# Patient Record
Sex: Male | Born: 1940 | Race: Black or African American | Hispanic: No | Marital: Married | Smoking: Former smoker
Health system: Southern US, Community
[De-identification: ages and names within clinical notes are randomized; demographics above are authoritative.]

## PROBLEM LIST (undated history)

## (undated) DIAGNOSIS — K279 Peptic ulcer, site unspecified, unspecified as acute or chronic, without hemorrhage or perforation: Secondary | ICD-10-CM

## (undated) DIAGNOSIS — I712 Thoracic aortic aneurysm, without rupture, unspecified: Secondary | ICD-10-CM

## (undated) DIAGNOSIS — Z8546 Personal history of malignant neoplasm of prostate: Secondary | ICD-10-CM

## (undated) DIAGNOSIS — R7302 Impaired glucose tolerance (oral): Secondary | ICD-10-CM

## (undated) DIAGNOSIS — F419 Anxiety disorder, unspecified: Secondary | ICD-10-CM

## (undated) DIAGNOSIS — I219 Acute myocardial infarction, unspecified: Secondary | ICD-10-CM

## (undated) DIAGNOSIS — Z9889 Other specified postprocedural states: Secondary | ICD-10-CM

## (undated) DIAGNOSIS — N393 Stress incontinence (female) (male): Secondary | ICD-10-CM

## (undated) DIAGNOSIS — R9389 Abnormal findings on diagnostic imaging of other specified body structures: Secondary | ICD-10-CM

## (undated) DIAGNOSIS — I10 Essential (primary) hypertension: Secondary | ICD-10-CM

## (undated) DIAGNOSIS — K219 Gastro-esophageal reflux disease without esophagitis: Secondary | ICD-10-CM

## (undated) DIAGNOSIS — N529 Male erectile dysfunction, unspecified: Secondary | ICD-10-CM

## (undated) DIAGNOSIS — H269 Unspecified cataract: Secondary | ICD-10-CM

## (undated) DIAGNOSIS — H4010X0 Unspecified open-angle glaucoma, stage unspecified: Secondary | ICD-10-CM

## (undated) DIAGNOSIS — E669 Obesity, unspecified: Secondary | ICD-10-CM

## (undated) DIAGNOSIS — M519 Unspecified thoracic, thoracolumbar and lumbosacral intervertebral disc disorder: Secondary | ICD-10-CM

## (undated) DIAGNOSIS — T7840XA Allergy, unspecified, initial encounter: Secondary | ICD-10-CM

## (undated) DIAGNOSIS — N210 Calculus in bladder: Secondary | ICD-10-CM

## (undated) DIAGNOSIS — I5020 Unspecified systolic (congestive) heart failure: Secondary | ICD-10-CM

## (undated) DIAGNOSIS — C61 Malignant neoplasm of prostate: Secondary | ICD-10-CM

## (undated) DIAGNOSIS — B9681 Helicobacter pylori [H. pylori] as the cause of diseases classified elsewhere: Secondary | ICD-10-CM

## (undated) DIAGNOSIS — E785 Hyperlipidemia, unspecified: Secondary | ICD-10-CM

## (undated) DIAGNOSIS — I77810 Thoracic aortic ectasia: Secondary | ICD-10-CM

## (undated) DIAGNOSIS — M1990 Unspecified osteoarthritis, unspecified site: Secondary | ICD-10-CM

## (undated) DIAGNOSIS — D649 Anemia, unspecified: Secondary | ICD-10-CM

## (undated) DIAGNOSIS — J45909 Unspecified asthma, uncomplicated: Secondary | ICD-10-CM

## (undated) HISTORY — PX: BACK SURGERY: SHX140

## (undated) HISTORY — DX: Helicobacter pylori (H. pylori) as the cause of diseases classified elsewhere: B96.81

## (undated) HISTORY — DX: Stress incontinence (female) (male): N39.3

## (undated) HISTORY — DX: Impaired glucose tolerance (oral): R73.02

## (undated) HISTORY — DX: Anemia, unspecified: D64.9

## (undated) HISTORY — DX: Abnormal findings on diagnostic imaging of other specified body structures: R93.89

## (undated) HISTORY — PX: PROSTATECTOMY: SHX69

## (undated) HISTORY — DX: Obesity, unspecified: E66.9

## (undated) HISTORY — DX: Anxiety disorder, unspecified: F41.9

## (undated) HISTORY — DX: Peptic ulcer, site unspecified, unspecified as acute or chronic, without hemorrhage or perforation: K27.9

## (undated) HISTORY — DX: Other specified postprocedural states: Z98.890

## (undated) HISTORY — DX: Unspecified systolic (congestive) heart failure: I50.20

## (undated) HISTORY — DX: Thoracic aortic ectasia: I77.810

## (undated) HISTORY — DX: Allergy, unspecified, initial encounter: T78.40XA

## (undated) HISTORY — DX: Unspecified open-angle glaucoma, stage unspecified: H40.10X0

## (undated) HISTORY — DX: Gastro-esophageal reflux disease without esophagitis: K21.9

## (undated) HISTORY — DX: Unspecified asthma, uncomplicated: J45.909

## (undated) HISTORY — DX: Helicobacter pylori (H. pylori) as the cause of diseases classified elsewhere: K27.9

## (undated) HISTORY — DX: Thoracic aortic aneurysm, without rupture, unspecified: I71.20

## (undated) HISTORY — DX: Acute myocardial infarction, unspecified: I21.9

## (undated) HISTORY — DX: Malignant neoplasm of prostate: C61

## (undated) HISTORY — DX: Unspecified cataract: H26.9

## (undated) HISTORY — DX: Hyperlipidemia, unspecified: E78.5

## (undated) HISTORY — DX: Male erectile dysfunction, unspecified: N52.9

## (undated) HISTORY — DX: Thoracic aortic aneurysm, without rupture: I71.2

## (undated) HISTORY — PX: OTHER SURGICAL HISTORY: SHX169

## (undated) HISTORY — DX: Personal history of malignant neoplasm of prostate: Z85.46

## (undated) HISTORY — DX: Calculus in bladder: N21.0

## (undated) HISTORY — DX: Unspecified thoracic, thoracolumbar and lumbosacral intervertebral disc disorder: M51.9

## (undated) HISTORY — DX: Unspecified osteoarthritis, unspecified site: M19.90

## (undated) HISTORY — DX: Essential (primary) hypertension: I10

---

## 1998-05-20 ENCOUNTER — Encounter: Admission: RE | Admit: 1998-05-20 | Discharge: 1998-05-20 | Payer: Self-pay | Admitting: Internal Medicine

## 1998-06-05 ENCOUNTER — Encounter: Admission: RE | Admit: 1998-06-05 | Discharge: 1998-06-05 | Payer: Self-pay | Admitting: Hematology and Oncology

## 1998-06-13 ENCOUNTER — Encounter: Admission: RE | Admit: 1998-06-13 | Discharge: 1998-06-13 | Payer: Self-pay | Admitting: Internal Medicine

## 1998-11-13 ENCOUNTER — Encounter: Admission: RE | Admit: 1998-11-13 | Discharge: 1998-11-13 | Payer: Self-pay | Admitting: Internal Medicine

## 1999-03-16 ENCOUNTER — Encounter: Admission: RE | Admit: 1999-03-16 | Discharge: 1999-03-16 | Payer: Self-pay | Admitting: Internal Medicine

## 1999-10-13 ENCOUNTER — Emergency Department (HOSPITAL_COMMUNITY): Admission: EM | Admit: 1999-10-13 | Discharge: 1999-10-13 | Payer: Self-pay | Admitting: *Deleted

## 1999-10-13 ENCOUNTER — Encounter: Payer: Self-pay | Admitting: Emergency Medicine

## 1999-11-30 ENCOUNTER — Other Ambulatory Visit: Admission: RE | Admit: 1999-11-30 | Discharge: 1999-11-30 | Payer: Self-pay | Admitting: Otolaryngology

## 1999-11-30 ENCOUNTER — Encounter (INDEPENDENT_AMBULATORY_CARE_PROVIDER_SITE_OTHER): Payer: Self-pay

## 2000-01-15 ENCOUNTER — Encounter: Admission: RE | Admit: 2000-01-15 | Discharge: 2000-01-15 | Payer: Self-pay | Admitting: Internal Medicine

## 2000-06-29 ENCOUNTER — Encounter: Admission: RE | Admit: 2000-06-29 | Discharge: 2000-06-29 | Payer: Self-pay | Admitting: Internal Medicine

## 2001-01-30 ENCOUNTER — Encounter: Admission: RE | Admit: 2001-01-30 | Discharge: 2001-01-30 | Payer: Self-pay | Admitting: Internal Medicine

## 2001-01-30 ENCOUNTER — Encounter: Payer: Self-pay | Admitting: Internal Medicine

## 2001-01-30 ENCOUNTER — Ambulatory Visit (HOSPITAL_COMMUNITY): Admission: RE | Admit: 2001-01-30 | Discharge: 2001-01-30 | Payer: Self-pay | Admitting: Internal Medicine

## 2001-02-08 ENCOUNTER — Encounter: Admission: RE | Admit: 2001-02-08 | Discharge: 2001-02-08 | Payer: Self-pay | Admitting: Internal Medicine

## 2001-03-07 ENCOUNTER — Encounter: Admission: RE | Admit: 2001-03-07 | Discharge: 2001-03-07 | Payer: Self-pay | Admitting: Internal Medicine

## 2002-04-05 ENCOUNTER — Emergency Department (HOSPITAL_COMMUNITY): Admission: EM | Admit: 2002-04-05 | Discharge: 2002-04-05 | Payer: Self-pay

## 2002-06-13 ENCOUNTER — Encounter: Admission: RE | Admit: 2002-06-13 | Discharge: 2002-06-13 | Payer: Self-pay | Admitting: Internal Medicine

## 2002-06-18 ENCOUNTER — Encounter: Admission: RE | Admit: 2002-06-18 | Discharge: 2002-06-18 | Payer: Self-pay | Admitting: Internal Medicine

## 2002-10-24 ENCOUNTER — Ambulatory Visit (HOSPITAL_COMMUNITY): Admission: RE | Admit: 2002-10-24 | Discharge: 2002-10-24 | Payer: Self-pay | Admitting: Internal Medicine

## 2002-10-24 ENCOUNTER — Encounter: Admission: RE | Admit: 2002-10-24 | Discharge: 2002-10-24 | Payer: Self-pay | Admitting: Internal Medicine

## 2002-10-24 ENCOUNTER — Encounter: Payer: Self-pay | Admitting: Internal Medicine

## 2002-10-31 ENCOUNTER — Encounter: Admission: RE | Admit: 2002-10-31 | Discharge: 2002-10-31 | Payer: Self-pay | Admitting: Internal Medicine

## 2002-11-16 ENCOUNTER — Ambulatory Visit (HOSPITAL_COMMUNITY): Admission: RE | Admit: 2002-11-16 | Discharge: 2002-11-16 | Payer: Self-pay | Admitting: Internal Medicine

## 2002-11-16 ENCOUNTER — Encounter: Payer: Self-pay | Admitting: Internal Medicine

## 2002-12-05 ENCOUNTER — Encounter: Admission: RE | Admit: 2002-12-05 | Discharge: 2002-12-05 | Payer: Self-pay | Admitting: Internal Medicine

## 2002-12-11 ENCOUNTER — Encounter: Admission: RE | Admit: 2002-12-11 | Discharge: 2003-01-14 | Payer: Self-pay | Admitting: Internal Medicine

## 2002-12-19 ENCOUNTER — Encounter: Admission: RE | Admit: 2002-12-19 | Discharge: 2002-12-19 | Payer: Self-pay | Admitting: Internal Medicine

## 2003-02-05 ENCOUNTER — Encounter: Admission: RE | Admit: 2003-02-05 | Discharge: 2003-02-05 | Payer: Self-pay | Admitting: Internal Medicine

## 2003-05-23 ENCOUNTER — Encounter: Admission: RE | Admit: 2003-05-23 | Discharge: 2003-05-23 | Payer: Self-pay | Admitting: Internal Medicine

## 2003-06-19 ENCOUNTER — Encounter: Admission: RE | Admit: 2003-06-19 | Discharge: 2003-06-19 | Payer: Self-pay | Admitting: Internal Medicine

## 2004-01-17 ENCOUNTER — Emergency Department (HOSPITAL_COMMUNITY): Admission: AD | Admit: 2004-01-17 | Discharge: 2004-01-17 | Payer: Self-pay | Admitting: Family Medicine

## 2004-01-21 ENCOUNTER — Emergency Department (HOSPITAL_COMMUNITY): Admission: EM | Admit: 2004-01-21 | Discharge: 2004-01-21 | Payer: Self-pay | Admitting: Family Medicine

## 2004-02-11 ENCOUNTER — Encounter: Admission: RE | Admit: 2004-02-11 | Discharge: 2004-02-11 | Payer: Self-pay | Admitting: Internal Medicine

## 2004-03-06 ENCOUNTER — Encounter: Admission: RE | Admit: 2004-03-06 | Discharge: 2004-03-06 | Payer: Self-pay | Admitting: Internal Medicine

## 2004-08-06 ENCOUNTER — Ambulatory Visit: Payer: Self-pay | Admitting: Internal Medicine

## 2004-08-13 ENCOUNTER — Ambulatory Visit: Payer: Self-pay | Admitting: Internal Medicine

## 2004-09-02 ENCOUNTER — Ambulatory Visit (HOSPITAL_COMMUNITY): Admission: RE | Admit: 2004-09-02 | Discharge: 2004-09-02 | Payer: Self-pay | Admitting: Internal Medicine

## 2004-09-02 ENCOUNTER — Encounter: Payer: Self-pay | Admitting: Internal Medicine

## 2004-10-01 ENCOUNTER — Ambulatory Visit: Payer: Self-pay | Admitting: Internal Medicine

## 2004-10-11 ENCOUNTER — Ambulatory Visit: Payer: Self-pay | Admitting: Internal Medicine

## 2004-10-13 ENCOUNTER — Ambulatory Visit: Payer: Self-pay | Admitting: Internal Medicine

## 2004-10-19 ENCOUNTER — Ambulatory Visit: Payer: Self-pay | Admitting: Internal Medicine

## 2004-11-25 ENCOUNTER — Ambulatory Visit: Payer: Self-pay | Admitting: Internal Medicine

## 2004-12-04 ENCOUNTER — Ambulatory Visit: Payer: Self-pay | Admitting: Internal Medicine

## 2004-12-04 ENCOUNTER — Ambulatory Visit (HOSPITAL_COMMUNITY): Admission: RE | Admit: 2004-12-04 | Discharge: 2004-12-04 | Payer: Self-pay | Admitting: *Deleted

## 2004-12-09 ENCOUNTER — Inpatient Hospital Stay (HOSPITAL_BASED_OUTPATIENT_CLINIC_OR_DEPARTMENT_OTHER): Admission: RE | Admit: 2004-12-09 | Discharge: 2004-12-09 | Payer: Self-pay | Admitting: Internal Medicine

## 2004-12-09 ENCOUNTER — Ambulatory Visit: Payer: Self-pay | Admitting: Internal Medicine

## 2004-12-30 ENCOUNTER — Ambulatory Visit: Payer: Self-pay | Admitting: Internal Medicine

## 2005-09-29 ENCOUNTER — Ambulatory Visit: Payer: Self-pay | Admitting: Internal Medicine

## 2005-10-20 ENCOUNTER — Ambulatory Visit: Payer: Self-pay | Admitting: Cardiology

## 2005-10-20 ENCOUNTER — Encounter: Payer: Self-pay | Admitting: Cardiology

## 2005-10-20 ENCOUNTER — Ambulatory Visit (HOSPITAL_COMMUNITY): Admission: RE | Admit: 2005-10-20 | Discharge: 2005-10-20 | Payer: Self-pay | Admitting: Internal Medicine

## 2005-10-22 ENCOUNTER — Ambulatory Visit: Payer: Self-pay | Admitting: Gastroenterology

## 2005-11-09 ENCOUNTER — Ambulatory Visit: Payer: Self-pay | Admitting: Gastroenterology

## 2005-11-09 ENCOUNTER — Encounter: Payer: Self-pay | Admitting: Internal Medicine

## 2005-11-11 ENCOUNTER — Ambulatory Visit (HOSPITAL_BASED_OUTPATIENT_CLINIC_OR_DEPARTMENT_OTHER): Admission: RE | Admit: 2005-11-11 | Discharge: 2005-11-11 | Payer: Self-pay | Admitting: Urology

## 2005-11-11 ENCOUNTER — Ambulatory Visit (HOSPITAL_COMMUNITY): Admission: RE | Admit: 2005-11-11 | Discharge: 2005-11-11 | Payer: Self-pay | Admitting: Urology

## 2005-11-16 ENCOUNTER — Emergency Department (HOSPITAL_COMMUNITY): Admission: EM | Admit: 2005-11-16 | Discharge: 2005-11-16 | Payer: Self-pay | Admitting: Emergency Medicine

## 2005-11-18 ENCOUNTER — Ambulatory Visit: Payer: Self-pay | Admitting: Internal Medicine

## 2005-11-18 ENCOUNTER — Ambulatory Visit (HOSPITAL_COMMUNITY): Admission: RE | Admit: 2005-11-18 | Discharge: 2005-11-18 | Payer: Self-pay | Admitting: Internal Medicine

## 2005-11-18 ENCOUNTER — Inpatient Hospital Stay (HOSPITAL_COMMUNITY): Admission: AD | Admit: 2005-11-18 | Discharge: 2005-11-19 | Payer: Self-pay | Admitting: Internal Medicine

## 2005-12-28 ENCOUNTER — Ambulatory Visit: Payer: Self-pay | Admitting: Internal Medicine

## 2006-10-06 DIAGNOSIS — I10 Essential (primary) hypertension: Secondary | ICD-10-CM

## 2006-10-06 DIAGNOSIS — E669 Obesity, unspecified: Secondary | ICD-10-CM

## 2006-10-06 HISTORY — DX: Obesity, unspecified: E66.9

## 2006-10-11 ENCOUNTER — Ambulatory Visit (HOSPITAL_COMMUNITY): Admission: RE | Admit: 2006-10-11 | Discharge: 2006-10-11 | Payer: Self-pay | Admitting: Gastroenterology

## 2006-12-10 DIAGNOSIS — R21 Rash and other nonspecific skin eruption: Secondary | ICD-10-CM | POA: Insufficient documentation

## 2006-12-12 ENCOUNTER — Ambulatory Visit: Payer: Self-pay | Admitting: Internal Medicine

## 2006-12-12 ENCOUNTER — Telehealth: Payer: Self-pay | Admitting: *Deleted

## 2006-12-14 ENCOUNTER — Emergency Department (HOSPITAL_COMMUNITY): Admission: EM | Admit: 2006-12-14 | Discharge: 2006-12-14 | Payer: Self-pay | Admitting: Family Medicine

## 2006-12-26 ENCOUNTER — Ambulatory Visit: Payer: Self-pay | Admitting: Internal Medicine

## 2006-12-26 ENCOUNTER — Ambulatory Visit: Payer: Self-pay | Admitting: Cardiology

## 2006-12-26 DIAGNOSIS — R9389 Abnormal findings on diagnostic imaging of other specified body structures: Secondary | ICD-10-CM

## 2006-12-26 HISTORY — DX: Abnormal findings on diagnostic imaging of other specified body structures: R93.89

## 2006-12-27 ENCOUNTER — Ambulatory Visit: Payer: Self-pay | Admitting: Internal Medicine

## 2006-12-27 LAB — CONVERTED CEMR LAB
BUN: 13 mg/dL (ref 6–23)
Cholesterol: 187 mg/dL (ref 0–200)
Potassium: 4.2 meq/L (ref 3.5–5.3)
Sodium: 141 meq/L (ref 135–145)
Total CHOL/HDL Ratio: 3.4
Triglycerides: 79 mg/dL (ref <150)
VLDL: 16 mg/dL (ref 0–40)

## 2007-01-03 ENCOUNTER — Encounter: Payer: Self-pay | Admitting: Cardiology

## 2007-01-03 ENCOUNTER — Ambulatory Visit (HOSPITAL_COMMUNITY): Admission: RE | Admit: 2007-01-03 | Discharge: 2007-01-03 | Payer: Self-pay | Admitting: Internal Medicine

## 2007-01-23 ENCOUNTER — Telehealth: Payer: Self-pay | Admitting: *Deleted

## 2007-07-26 ENCOUNTER — Telehealth (INDEPENDENT_AMBULATORY_CARE_PROVIDER_SITE_OTHER): Payer: Self-pay | Admitting: *Deleted

## 2007-08-11 ENCOUNTER — Encounter: Payer: Self-pay | Admitting: Internal Medicine

## 2007-12-15 ENCOUNTER — Telehealth: Payer: Self-pay | Admitting: Internal Medicine

## 2007-12-30 ENCOUNTER — Ambulatory Visit: Payer: Self-pay | Admitting: Cardiovascular Disease

## 2008-01-09 ENCOUNTER — Ambulatory Visit (HOSPITAL_COMMUNITY): Admission: RE | Admit: 2008-01-09 | Discharge: 2008-01-09 | Payer: Self-pay | Admitting: Internal Medicine

## 2008-01-09 ENCOUNTER — Encounter: Payer: Self-pay | Admitting: Internal Medicine

## 2008-01-30 ENCOUNTER — Emergency Department (HOSPITAL_COMMUNITY): Admission: EM | Admit: 2008-01-30 | Discharge: 2008-01-30 | Payer: Self-pay | Admitting: Family Medicine

## 2008-02-15 ENCOUNTER — Encounter: Payer: Self-pay | Admitting: Internal Medicine

## 2008-02-21 ENCOUNTER — Encounter: Payer: Self-pay | Admitting: Internal Medicine

## 2008-03-19 ENCOUNTER — Ambulatory Visit: Payer: Self-pay | Admitting: Internal Medicine

## 2008-03-29 ENCOUNTER — Ambulatory Visit: Payer: Self-pay | Admitting: Internal Medicine

## 2008-03-29 LAB — CONVERTED CEMR LAB
BUN: 10 mg/dL (ref 6–23)
Chloride: 104 meq/L (ref 96–112)
Creatinine, Ser: 1.06 mg/dL (ref 0.40–1.50)
LDL Cholesterol: 108 mg/dL — ABNORMAL HIGH (ref 0–99)
Triglycerides: 72 mg/dL (ref <150)

## 2008-04-21 ENCOUNTER — Emergency Department (HOSPITAL_COMMUNITY): Admission: EM | Admit: 2008-04-21 | Discharge: 2008-04-21 | Payer: Self-pay | Admitting: Family Medicine

## 2008-07-12 ENCOUNTER — Encounter: Payer: Self-pay | Admitting: Internal Medicine

## 2008-07-25 ENCOUNTER — Ambulatory Visit (HOSPITAL_COMMUNITY): Admission: RE | Admit: 2008-07-25 | Discharge: 2008-07-25 | Payer: Self-pay | Admitting: Urology

## 2009-02-14 ENCOUNTER — Encounter: Payer: Self-pay | Admitting: Internal Medicine

## 2009-03-04 ENCOUNTER — Telehealth: Payer: Self-pay | Admitting: *Deleted

## 2009-03-12 ENCOUNTER — Encounter: Payer: Self-pay | Admitting: Internal Medicine

## 2009-03-12 ENCOUNTER — Ambulatory Visit: Payer: Self-pay | Admitting: Cardiology

## 2009-03-12 ENCOUNTER — Ambulatory Visit (HOSPITAL_COMMUNITY): Admission: RE | Admit: 2009-03-12 | Discharge: 2009-03-12 | Payer: Self-pay | Admitting: Internal Medicine

## 2009-06-13 ENCOUNTER — Ambulatory Visit: Payer: Self-pay | Admitting: Internal Medicine

## 2009-06-13 LAB — CONVERTED CEMR LAB
CO2: 28 meq/L (ref 19–32)
Chloride: 103 meq/L (ref 96–112)
Creatinine, Ser: 1.12 mg/dL (ref 0.40–1.50)
Sodium: 142 meq/L (ref 135–145)

## 2009-07-30 ENCOUNTER — Emergency Department (HOSPITAL_COMMUNITY): Admission: EM | Admit: 2009-07-30 | Discharge: 2009-07-30 | Payer: Self-pay | Admitting: Family Medicine

## 2009-08-06 ENCOUNTER — Ambulatory Visit: Payer: Self-pay | Admitting: Infectious Diseases

## 2009-08-06 DIAGNOSIS — H4010X0 Unspecified open-angle glaucoma, stage unspecified: Secondary | ICD-10-CM

## 2009-08-06 DIAGNOSIS — J45909 Unspecified asthma, uncomplicated: Secondary | ICD-10-CM

## 2009-08-06 DIAGNOSIS — H401133 Primary open-angle glaucoma, bilateral, severe stage: Secondary | ICD-10-CM | POA: Insufficient documentation

## 2009-08-06 DIAGNOSIS — M25559 Pain in unspecified hip: Secondary | ICD-10-CM

## 2009-08-06 DIAGNOSIS — Z8546 Personal history of malignant neoplasm of prostate: Secondary | ICD-10-CM | POA: Insufficient documentation

## 2009-08-06 HISTORY — DX: Unspecified asthma, uncomplicated: J45.909

## 2009-08-06 HISTORY — DX: Personal history of malignant neoplasm of prostate: Z85.46

## 2009-08-06 HISTORY — DX: Unspecified open-angle glaucoma, stage unspecified: H40.10X0

## 2009-08-06 LAB — CONVERTED CEMR LAB
BUN: 9 mg/dL (ref 6–23)
Chloride: 104 meq/L (ref 96–112)
Glucose, Bld: 75 mg/dL (ref 70–99)
Potassium: 4.8 meq/L (ref 3.5–5.3)

## 2009-08-11 ENCOUNTER — Encounter: Payer: Self-pay | Admitting: Internal Medicine

## 2009-09-02 ENCOUNTER — Telehealth: Payer: Self-pay | Admitting: Internal Medicine

## 2009-10-01 ENCOUNTER — Encounter: Payer: Self-pay | Admitting: Internal Medicine

## 2009-12-01 ENCOUNTER — Telehealth: Payer: Self-pay | Admitting: Internal Medicine

## 2009-12-19 ENCOUNTER — Ambulatory Visit: Payer: Self-pay | Admitting: Internal Medicine

## 2010-02-09 ENCOUNTER — Encounter: Payer: Self-pay | Admitting: Internal Medicine

## 2010-03-04 ENCOUNTER — Telehealth: Payer: Self-pay | Admitting: Internal Medicine

## 2010-08-05 ENCOUNTER — Encounter: Payer: Self-pay | Admitting: Internal Medicine

## 2010-12-12 ENCOUNTER — Encounter: Payer: Self-pay | Admitting: Gastroenterology

## 2010-12-22 NOTE — Progress Notes (Signed)
Summary: med refill/gp  Phone Note Refill Request Message from:  Fax from Pharmacy on December 01, 2009 2:12 PM  Refills Requested: Medication #1:  FUROSEMIDE 40 MG TABS Take 1 tablet by mouth once a day   Last Refilled: 10/28/2009  Method Requested: Electronic Initial call taken by: Chinita Pester RN,  December 01, 2009 2:12 PM    Prescriptions: FUROSEMIDE 40 MG TABS (FUROSEMIDE) Take 1 tablet by mouth once a day  #30 Tablet x 7   Entered and Authorized by:   Ulyess Mort MD   Signed by:   Ulyess Mort MD on 12/01/2009   Method used:   Electronically to        [facility] [address] [phone]* (retail)       [address]       [place], [place]  [address]       Ph: [phone] or [phone]       Fax: [phone]   RxID:   [id number]

## 2010-12-22 NOTE — Progress Notes (Signed)
Summary: Refill/gh  Phone Note Refill Request Message from:  Fax from Pharmacy on March 04, 2010 10:16 AM  Refills Requested: Medication #1:  FUROSEMIDE 40 MG TABS Take 1 tablet by mouth once a day Pt insurance will only cover a 90 day supply.  Need prescription for 90 supply.   Method Requested: Electronic Initial call taken by: Angelina Ok RN,  March 04, 2010 10:17 AM  Follow-up for Phone Call        Refill approved-nurse to complete Follow-up by: Ulyess Mort MD,  March 04, 2010 11:17 AM    Prescriptions: FUROSEMIDE 40 MG TABS (FUROSEMIDE) Take 1 tablet by mouth once a day  #90 x 11   Entered and Authorized by:   Ulyess Mort MD   Signed by:   Ulyess Mort MD on 03/04/2010   Method used:   [facility]ronically to        [facility] [address] [phone]* (retail)       [address]       [place], [place]  [address]       Ph: [phone] or [phone]       Fax: [phone]   RxID:   [id number]

## 2010-12-22 NOTE — Letter (Signed)
Summary: [facility]  [facility]   Imported By: Margie Billet 02/26/2010 09:47:43  _____________________________________________________________________  External Attachment:    Type:   Image     Comment:   External Document

## 2010-12-22 NOTE — Assessment & Plan Note (Signed)
Summary: est-routine f/u//kg   Vital Signs:  Patient profile:   70 year old male Height:      76 inches (193.04 cm) Weight:      292.9 pounds (133.14 kg) BMI:     35.78 Temp:     98.5 degrees F (36.94 degrees C) oral Pulse rate:   75 / minute BP sitting:   154 / 79  (right arm) Cuff size:   large  Vitals Entered By: Krystal Eaton Duncan Dull) (December 19, 2009 10:00 AM)  Have you ever been in a relationship where you felt threatened, hurt or afraid?No   Does patient need assistance? Functional Status Self care Ambulation Normal   Primary Care Provider:  Ulyess Mort MD   History of Present Illness: 23 man with mild HTN and regional pains.  He is doing very well.  Does report low back pain that is exacerbated by some episodes of exertion and by some forms of exercise.  No sciatic radiation.  No fever or wgt loss.  Does have remote prostate cancer/surgery but has regular f/u by Dr. Annabell Howells and had PSA od 0.56 last season.  Preventive Screening-Counseling & Management  Alcohol-Tobacco     Smoking Status: quit     Year Quit: 1990  Allergies: No Known Drug Allergies  Physical Exam  Msk:  SLR negative.  Strength full in legs.   Impression & Recommendations:  Problem # 1:  HYPERTENSION (ICD-401.9) Was at goal in July 2010.  Mildly up today. Labs in Sept 2010:  K = 4.8 and creat = 1.09 No CV symptoms. He works as Electrical engineer and exercises regularly.  Requested note to endorse activities at gym.  I did this.   His updated medication list for this problem includes:    Furosemide 40 Mg Tabs (Furosemide) .Marland Kitchen... Take 1 tablet by mouth once a day  Complete Medication List: 1)  Albuterol 90 Mcg/act Aers (Albuterol) .... Use as directed 2)  Furosemide 40 Mg Tabs (Furosemide) .... Take 1 tablet by mouth once a day 3)  Ibuprofen 200 Mg Tabs (Ibuprofen) .... Otc  Patient Instructions: 1)  Please schedule a follow-up appointment in 6 months.   Prevention & Chronic  Care Immunizations   Influenza vaccine: Not documented   Influenza vaccine deferral: Deferred  (08/06/2009)    Tetanus booster: Not documented   Td booster deferral: Deferred  (08/06/2009)    Pneumococcal vaccine: Not documented   Pneumococcal vaccine deferral: Deferred  (08/06/2009)    H. zoster vaccine: Not documented  Colorectal Screening   Hemoccult: Not documented   Hemoccult action/deferral: Not indicated  (08/06/2009)    Colonoscopy: Dr. Christella Hartigan.  Poor prep and excessive length of colon. Limited study; followed by [facility] (see report).  (11/09/2005)   Colonoscopy action/deferral: Not indicated  (08/06/2009)  Other Screening   PSA: 0.56  (08/06/2009)   PSA action/deferral: Not indicated  (08/06/2009)   Smoking status: quit  (12/19/2009)  Lipids   Total Cholesterol: 165  (03/29/2008)   Lipid panel action/deferral: Deferred   LDL: 108  (03/29/2008)   LDL Direct: Not documented   HDL: 43  (03/29/2008)   Triglycerides: 72  (03/29/2008)   Lipid panel due: 09/05/2009  Hypertension   Last Blood Pressure: 154 / 79  (12/19/2009)   Serum creatinine: 1.09  (08/06/2009)   BMP action: Ordered   Serum potassium 4.8  (08/06/2009)  Self-Management Support :    Patient will work on the following items until the next clinic visit to reach self-care  goals:     Medications and monitoring: take my medicines every day  (12/19/2009)     Eating: eat foods that are low in salt, eat baked foods instead of fried foods  (12/19/2009)     Activity: take a 30 minute walk every day  (12/19/2009)    Hypertension self-management support: Referred for medical nutrition therapy  (08/06/2009)

## 2010-12-22 NOTE — Consult Note (Signed)
Summary: [facility]  [facility]   Imported By: Louretta Parma 08/25/2010 09:35:12  _____________________________________________________________________  External Attachment:    Type:   Image     Comment:   External Document

## 2011-01-20 ENCOUNTER — Emergency Department (HOSPITAL_COMMUNITY): Payer: Medicare Other

## 2011-01-20 ENCOUNTER — Emergency Department (HOSPITAL_COMMUNITY)
Admission: EM | Admit: 2011-01-20 | Discharge: 2011-01-20 | Disposition: A | Discharge status: Home / Self Care | Payer: Medicare Other | Attending: Emergency Medicine | Admitting: Emergency Medicine

## 2011-01-20 DIAGNOSIS — W1809XA Striking against other object with subsequent fall, initial encounter: Secondary | ICD-10-CM | POA: Insufficient documentation

## 2011-01-20 DIAGNOSIS — R0602 Shortness of breath: Secondary | ICD-10-CM | POA: Insufficient documentation

## 2011-01-20 DIAGNOSIS — J45909 Unspecified asthma, uncomplicated: Secondary | ICD-10-CM | POA: Insufficient documentation

## 2011-01-20 DIAGNOSIS — I2510 Atherosclerotic heart disease of native coronary artery without angina pectoris: Secondary | ICD-10-CM | POA: Insufficient documentation

## 2011-01-20 DIAGNOSIS — Z8546 Personal history of malignant neoplasm of prostate: Secondary | ICD-10-CM | POA: Insufficient documentation

## 2011-01-20 DIAGNOSIS — Y929 Unspecified place or not applicable: Secondary | ICD-10-CM | POA: Insufficient documentation

## 2011-01-20 DIAGNOSIS — I509 Heart failure, unspecified: Secondary | ICD-10-CM | POA: Insufficient documentation

## 2011-01-20 DIAGNOSIS — R220 Localized swelling, mass and lump, head: Secondary | ICD-10-CM | POA: Insufficient documentation

## 2011-01-20 DIAGNOSIS — R55 Syncope and collapse: Secondary | ICD-10-CM | POA: Insufficient documentation

## 2011-01-20 DIAGNOSIS — R0789 Other chest pain: Secondary | ICD-10-CM | POA: Insufficient documentation

## 2011-01-20 DIAGNOSIS — I10 Essential (primary) hypertension: Secondary | ICD-10-CM | POA: Insufficient documentation

## 2011-01-20 DIAGNOSIS — IMO0002 Reserved for concepts with insufficient information to code with codable children: Secondary | ICD-10-CM | POA: Insufficient documentation

## 2011-01-20 DIAGNOSIS — R61 Generalized hyperhidrosis: Secondary | ICD-10-CM | POA: Insufficient documentation

## 2011-01-20 DIAGNOSIS — H409 Unspecified glaucoma: Secondary | ICD-10-CM | POA: Insufficient documentation

## 2011-01-20 LAB — CBC
HCT: 40.2 % (ref 39.0–52.0)
MCV: 92.2 fL (ref 78.0–100.0)
RBC: 4.36 MIL/uL (ref 4.22–5.81)
WBC: 3.1 10*3/uL — ABNORMAL LOW (ref 4.0–10.5)

## 2011-01-20 LAB — URINALYSIS, ROUTINE W REFLEX MICROSCOPIC
Bilirubin Urine: NEGATIVE
Ketones, ur: NEGATIVE mg/dL
Specific Gravity, Urine: 1.023 (ref 1.005–1.030)
Urine Glucose, Fasting: NEGATIVE mg/dL
pH: 8 (ref 5.0–8.0)

## 2011-01-20 LAB — DIFFERENTIAL
Lymphocytes Relative: 32 % (ref 12–46)
Lymphs Abs: 1 10*3/uL (ref 0.7–4.0)
Neutro Abs: 1.7 10*3/uL (ref 1.7–7.7)
Neutrophils Relative %: 55 % (ref 43–77)

## 2011-01-20 LAB — BASIC METABOLIC PANEL
BUN: 7 mg/dL (ref 6–23)
Chloride: 103 mEq/L (ref 96–112)
Glucose, Bld: 141 mg/dL — ABNORMAL HIGH (ref 70–99)
Potassium: 4 mEq/L (ref 3.5–5.1)

## 2011-01-20 LAB — POCT CARDIAC MARKERS: Troponin i, poc: <0.05 ng/mL (ref 0.00–0.09)

## 2011-01-20 LAB — URINE MICROSCOPIC-ADD ON

## 2011-03-07 ENCOUNTER — Other Ambulatory Visit: Payer: Self-pay | Admitting: Internal Medicine

## 2011-03-09 NOTE — Telephone Encounter (Signed)
No visit or labs in over a year.  Please make him an app't.

## 2011-03-23 ENCOUNTER — Inpatient Hospital Stay (INDEPENDENT_AMBULATORY_CARE_PROVIDER_SITE_OTHER)
Admission: RE | Admit: 2011-03-23 | Discharge: 2011-03-23 | Disposition: A | Discharge status: Home / Self Care | Payer: Medicare Other | Source: Ambulatory Visit | Attending: Family Medicine | Admitting: Family Medicine

## 2011-03-23 ENCOUNTER — Ambulatory Visit (INDEPENDENT_AMBULATORY_CARE_PROVIDER_SITE_OTHER): Payer: Medicare Other

## 2011-03-23 DIAGNOSIS — M79609 Pain in unspecified limb: Secondary | ICD-10-CM

## 2011-03-23 LAB — GLUCOSE, CAPILLARY: Glucose-Capillary: 125 mg/dL — ABNORMAL HIGH (ref 70–99)

## 2011-03-26 ENCOUNTER — Encounter: Payer: Self-pay | Admitting: Internal Medicine

## 2011-04-06 NOTE — Op Note (Signed)
NAME:  Dylan Benton, Dylan Benton               ACCOUNT NO.:  [id number]   MEDICAL RECORD NO.:  [id number]          PATIENT TYPE:  AMB   LOCATION:  DAY                          FACILITY:  [facility]   PHYSICIAN:  Excell Seltzer. Annabell Howells, M.D.    DATE OF BIRTH:  1941-09-03   DATE OF PROCEDURE:  07/25/2008  DATE OF DISCHARGE:                               OPERATIVE REPORT   PREOPERATIVE DIAGNOSIS:  Bladder/ureteral stones.   POSTOPERATIVE DIAGNOSIS:  Bladder/ureteral stones.   PROCEDURE:  Cystolitholapaxy with holmium laser lithotripsy.   ATTENDING PHYSICIAN:  Bjorn Pippin, MD.   RESIDENT PHYSICIAN:  Dr. Delman Kitten.   ANESTHESIA:  General.   INDICATIONS FOR PROCEDURE:  Mr. Lattin is a 70 year old African American  male with past medical history positive for prostate cancer status post  radical retropubic prostatectomy with bilateral inguinal lymph node  dissection.  He has had a PSA recurrence and he has been treated with  radiation therapy.  He has a history of recurrent free urinary tract  infections and on recent cystoscopy was found to have stones in his  proximal urethra at the level of his vesicourethral anastomosis.  He was  recommended for the above-stated procedure and informed consent was  obtained preoperatively.   PROCEDURE IN DETAIL:  The patient brought to the operating room, placed  in the supine position.  He was correctly identified by his wristband,  an appropriate time-out was taken, IV antibiotics were administered.  Based on his prior culture, and general anesthesia was delivered.  Once  adequately anesthetized, he was placed in the dorsal lithotomy position,  great care taken to minimize the risk of peripheral neuropathy or  compartment syndrome.  His perineum was prepped and draped sterilely.  We began our procedure by performing a rigid cystourethroscopy with a 22-  French rigid cystoscopic sheath, a 12 degree lens and normal saline as  our irrigant.  His anterior urethra was normal  in course and caliber.  Upon entry into his posterior urethra, one large and multiple small  stones were identified.  The large stone was adherent to the dystrophic  urothelium.  It was pushed back into the bladder without significant  injury to his urethra.  Close inspection of the proximal urethra  demonstrated that it was quite pale and dystrophic in appearance but no  other stones were seen.  The majority of the smaller stones were able to  be removed without any difficulty through the sheath based on antegrade  flow.  Remaining was the large stone which required laser lithotripsy.  Likewise, we used a 900 nanometer laser fiber and used the holmium laser  at settings of 5 Hz and 0.5 joules.  We lithotripsied this stone into  multiple small fragments and removed them all with antegrade flow  through the sheath as well.  Final inspection demonstrated no remaining  stones in the bladder, there was no bladder injury, both ureteral  orifices were noted to be in their normal anatomic position.  There was  no active bleeding from  the proximal urethra and subsequently his bladder was drained and we  removed the cystoscope and terminated our procedure.  He tolerated  procedure well.  He awoke from anesthesia, was taken to the recovery  room in stable condition.  There were no complications.  Dr. Annabell Howells was  present and participated in all aspects of the case.     ______________________________  Cori Razor. Annabell Howells, M.D.  Electronically Signed    DW/MEDQ  D:  07/25/2008  T:  07/25/2008  Job:  [id number]   cc:   Delman Kitten, Dr.

## 2011-04-09 NOTE — Discharge Summary (Signed)
NAMEMANDELL, PANGBORN               ACCOUNT NO.:  [id number]   MEDICAL RECORD NO.:  [id number]          PATIENT TYPE:  INP   LOCATION:  5733                         FACILITY:  [facility]   PHYSICIAN:  Alvester Morin, M.D.  DATE OF BIRTH:  [birth year]   DATE OF ADMISSION:  11/18/2005  DATE OF DISCHARGE:  11/19/2005                                 DISCHARGE SUMMARY   DISCHARGE DIAGNOSES:  1.  Urinary tract infection. Coagulase negative, catalase positive      staphylococcus.  2.  Pleuritic chest pain.  3.  Nausea and vomiting, resolved; secondary to #1.  4.  Hyperbilirubinemia.  5.  Anemia.   DISCHARGE MEDICATIONS:  1.  Doxycycline 100 mg p.o. b.i.d. x10 days.  2.  Claritin one p.o. daily.  3.  Aspirin 81 mg p.o. daily.  4.  Lasix 40 mg p.o. daily.  5.  Potassium chloride 40 mEq p.o. daily.  6.  Ocupress one drop each eye b.i.d.  7.  Albuterol inhaler 1-2 puffs every 4-6 hours p.r.n.  8.  Atrovent p.r.n.   CONDITION AT DISCHARGE:  Stable and improved.  The patient was instructed to  follow up with Dr. Annabell Howells, his urologist, on 11/25/05 at 12:15 p.m. in light  of his recent cystoscopy with release of a stricture and resultant urinary  tract infection for which he was admitted.  He was also instructed to follow  up with Dr. Aundria Rud, his primary care physician, in the outpatient clinic on  12/28/05 at 3 p.m. at which time he should have a stat C MET to follow up on  his increased bilirubin.  The patient was instructed to discontinue all  other antibiotics and only take the doxycycline for which a sample was  provided to the patient upon discharge.  The medications listed above were  taken from his clinic problem list on E-chart as the patient did not have  his medications with him when he came into the hospital.   PROCEDURES:  1.  Chest x-ray on 11/18/05 showed linear atelectasis in the left lower      lobe.  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  2.  CT angiogram of the chest on 11/19/05 showed no significant      abnormalities.  Specifically, no evidence of pulmonary emboli.   CONSULTANTS:  None.   ADMISSION HISTORY AND PHYSICAL:  Mr. Groleau is a 70 year old African-  American male with a past medical history of prostate cancer, bladder neck  stricture with recurrent UTI's, who presents with a four day history of  fevers, chills, and a dry cough.  The patient was seen by his urologist for  stricture on 11/12/05, six days prior to admission, and stricture was  released via cystoscopy which was done secondary to urinary retention.  His  urine was sent for culture and sensitivity.  Culture revealed coagulase  negative staphylococcus with the final results and sensitivities pending  from Dr. Belva Crome office.  The patient was started on cephalexin as an  outpatient.  The fevers, chills, nausea and vomiting started on 11/15/05,  three days prior to admission.  However, he was able to eat breakfast on the  morning of admission without vomiting.  The patient was also complaining of  pleuritic chest pain that started on the day of admission as well.  No pain  at rest or with exertion but pain with cough and dry heaves.  The patient  also has a history of COPD but states that he has not had any shortness of  breath and only uses his inhaler six times a year.  He saw his primary care  physician in the outpatient clinic the day of admission because his  urologist thought something else was going on besides the UTI.   PHYSICAL EXAMINATION ON ADMISSION:  VITALS:  Temperature 98.1, blood  pressure 124/72, pulse 89, respiratory rate 16, and he was saturating at 96%  on room air.  In general, he was in no acute distress.  EYES:  Pupils equal, round, and reactive to light and accommodation.  Extraocular movements are intact.  He had left eye esotropia and muddy  sclerae bilaterally.  ENT:  His oropharynx  was clear.  Mucous membranes were moist.  NECK:  Supple without lymphadenopathy and no JVD, thyromegaly or bruits.  LUNGS:  Clear to auscultation bilaterally.  HEART:  Regular in rate and rhythm.  GI:  Soft, nontender, nondistended.  Bowel sounds are present.  He had no  CVA tenderness.  EXTREMITIES:  No edema.  SKIN: Dry and warm.  He had no lymphadenopathy.  MUSCULOSKELETAL:  Nonfocal.  NEURO:  Nonfocal.  PSYCH:  Appropriate.   LABS ON ADMISSION:  Sodium 133, potassium 3.7, chloride 100, bicarb 29,  glucose 140, BUN 10, creatinine 1.1.  Total bilirubin 2.7, alkaline  phosphatase 92, AST 44, ALT 43, total protein 7.1, albumin 3.1.  Calcium  8.5.  White blood cell count 6.1, hemoglobin 13.2, MCV 92.3, platelets 195.  ABG with a pH of 7.433, PCO2 40.3, PO2 64.7, bicarb 26.5, oxygen saturation  92.8% on room air.  Cardiac enzymes:  Creatinine kinase 467, CK MB 4.1,  troponin I 0.01.  Blood cultures were drawn and were negative to date.  Bilirubin was fractionated;  total bilirubin 2.2, direct bilirubin 1.0,  indirect bilirubin 1.2.  A urine dipstick was done in the outpatient clinic  prior to admission which revealed 100 of glucose, it was dark orange, large  bilirubin, trace ketones, specific gravity greater than 1.030, trace blood,  pH 5.0, protein 100, urobilinogen greater than 8, positive for nitrites,  leukocyte esterase negative.  Urine micro was positive for 3-6 WBC's and  many bacteria, and granular casts.  Urinalysis reveals specific gravity of  1.041, large bilirubin, trace ketones, 30 protein, 4 urobilinogen, positive  nitrite, and small leukocyte esterase and a culture from the clinic is still  pending from today.   HOSPITAL COURSE:  Problem 1. UTI.  The patient was admitted secondary to  failing outpatient treatment with antibiotics.  The patient's complaint of nausea, vomiting, fever, and chills was consistent with a UTI and a question  of pyelonephritis.  We did not  think that the patient had pyelonephritis  secondary to no CVA tenderness, no problems with urination, and when he was  seen, he was afebrile and did not have a leukocytosis, either.  We obtained  the results of the urine culture that was collected at Dr. Belva Crome office  which showed coagulase negative, catalase positive staphylococcus species  sensitive  to tetracycline in particular which is what the patient was sent  home on.  The patient was given a ten day course of doxycycline to be taken  100 mg p.o. b.i.d.  The bacteria was sensitive to ampicillin, cephalothin,  nitrofurantoin, tetracycline, tobramycin.  The bacteria was resistant to  ceftriaxone, fleroxacin, ciprofloxacin, levofloxacin, trimethoprim  sulfamethoxazole, and trimethoprim. No species was identified on this urine  culture.  There is a urine culture pending from the outpatient clinic.  The  patient was advised to follow up with Dr. Annabell Howells next week on 11/25/05 at  12:15 regarding his urinary tract infection.   Problem 2.  Pleuritic chest pain.  Given the hypoxia seen on the ABG done on  admission, with the patient's history of pleuritic chest pain as well as his  history of malignancy, our pretest probability by Well's criteria was very  high for a pulmonary embolus.  Therefore, a CT angio was done to rule out a  pulmonary embolus.  The CT angio did not reveal any pulmonary emboli.  We  also ruled him out for an acute MI given his history of chest pain.  However, we were not as concerned about this given the fact that per the  patient, he had a catheterization approximately six months ago that did not  require any intervention and the patient states that it was a clean cath.  He also had cardiac enzymes done x3 which were all negative and his EKG was  normal.  No pneumonia was noted on chest x-ray or CT scan.  The pleuritic  chest pain resolved.  It was attributed to the cough and dry heaving he was  having prior to  admission.   Problem 3. Nausea and vomiting.  This is secondary most likely to the UTI.  It had since resolved prior to admission.  The patient did not experience  any nausea or vomiting while in the hospital.   Problem 4. Hyperbilirubinemia.  I do not have any previous records on the  patient regarding his bilirubin and he is not on any obvious medications  that would cause his bilirubin to be elevated such as a statin.  He has no  abdominal pain, no jaundice, he is asymptomatic at present.  He does not  have a known history of Gilberts disease, however this should be taken into  consideration.  A stat C MET should be done as an outpatient in order to  follow up on his hyperbilirubinemia.   Problem 5.  Anemia.  The patient, when he was admitted, had a normal  hemoglobin of 13.  The morning of discharge his hemoglobin was 12.1.  I do not see a history of anemia listed for this patient on his clinic problem  list. However, he just had a colonoscopy last week which is what we would do  for anemia  in a man of his age anyway so this was not worked up any  further.  He had no obvious signs or symptoms of a bleed and his hemoglobin  was stable at 12.1.   DISCHARGE LABS AND VITAL SIGNS:  Temperature 98.1, pulse 85, respirations  18, blood pressure 134/64, and he was saturating at 96% on room air.  Cardiac enzymes were negative x3.  ABG from the morning of discharge, his pH  was 7.410, PCO2 42.6, PO2 74.1, bicarb 26.1, and he was saturating at 94.4%  and this was on room air.  As stated previously, blood cultures were drawn  and negative to date.  Sodium 135, potassium 3.8, chloride 101, bicarb 28,  glucose 121, BUN 8, creatinine 1.2.  Calcium 8.5.  WBC 5.3, hemoglobin 12.1,  hematocrit 35.0, MCV 92.1, platelets 195.  Fractionated bilirubin:  Total  bilirubin 2.2, direct bilirubin 1.0, indirect bilirubin 1.2.   PENDING LABS:  There is a urine culture pending from the outpatient clinic.  It  was drawn on 11/18/05.  I would also recommend as an outpatient that the  patient have a hemoglobin A1C drawn in light of the glucose of 121 on his  fasting B MET on the day after admission.   This is a discharge summary dictated by Chauncey Reading, D.O.  for Dr. Harriett Sine  Phifer.      Chauncey Reading, D.O.      Alvester Morin, M.D.  Electronically Signed    EA/MEDQ  D:  11/19/2005  T:  11/21/2005  Job:  [id number]   cc:   C. Ulyess Mort, M.D.  Fax: [phone]   Excell Seltzer. Annabell Howells, M.D.  Fax: [phone]   Maretta Bees. Vonita Moss, M.D.  Fax: [phone]

## 2011-04-09 NOTE — Op Note (Signed)
Dylan Benton, Dylan Benton               ACCOUNT NO.:  [id number]   MEDICAL RECORD NO.:  [id number]          PATIENT TYPE:  AMB   LOCATION:  [facility]                         FACILITY:  [facility]   PHYSICIAN:  Excell Seltzer. Annabell Howells, M.D.    DATE OF BIRTH:  07-31-1941   DATE OF PROCEDURE:  11/11/2005  DATE OF DISCHARGE:  11/11/2005                                 OPERATIVE REPORT   PROCEDURE:  Cystoscopy with laser incision of bladder neck contracture.   PREOPERATIVE DIAGNOSIS:  Bladder neck contracture.   POSTOPERATIVE DIAGNOSIS:  Bladder neck contracture.   SURGEON:  Dr. Bjorn Pippin   ANESTHESIA:  General   SPECIMENS:  None.   COMPLICATIONS:  None.   INDICATIONS:  Mr. Noreen is a 70 year old black male with a history of  prostate cancer.  He is status post radical prostatectomy followed by  adjuvant radiation therapy.  He has had a history of a postoperative bladder  neck contracture and has had recurrent difficulty urinating with persistent  urinary tract infections.  Inspection cystoscopically in the office  demonstrated recurrent bladder neck contracture.  He comes in today for  incision of the bladder neck contracture.   FINDINGS AND PROCEDURE:  The patient was taken to the operating room where  general anesthetic was induced.  He had been on Bactrim preoperatively and  was given p.o. Cipro as part of his perioperative drugs.   He was taken to the operating room where a general anesthetic was induced.  He was placed in lithotomy position.  His perineum and genitalia were  prepped with Betadine solution, and he was draped in the usual sterile  fashion.  Cystoscopy was performed using a 22-French substernal scope and 12  and 70 degree lenses.  Examination revealed a normal urethra.  The external  sphincter was intact.  The prostate was absent, but a bladder neck  contracture was noted.  It was not pinpoint but would not admit the scope.  There was some neovascularity consistent with prior  radiation in this area  of scarring as well as some dystrophic calcifications.   The 1000 micron laser fiber was passed through the cystoscope, and the  bladder neck contracture was then incised at 4, 6, and 8 o'clock.  I was not  able to achieve a depth that did not include persistent scar but was able to  render the bladder neck widely patent.  The scope was passed into the  bladder, and further inspection of the bladder wall was performed.  He had  some mild trabeculation with some inflammatory changes but no suggestion of  tumor or stones.  The ureteral orifices were close to the bladder neck but  were otherwise unremarkable.   Cup biopsy forceps were used to remove some of the shaggy residue of the  laser process from the bladder neck.  An additional lengthening in the  incisions was made.   The bladder was then drained; the cystoscope was removed.  The patient was  taken down from lithotomy position.  His anesthetic was reversed.  He was  moved to the recovery room in  stable condition.  There were no  complications.      Excell Seltzer. Annabell Howells, M.D.  Electronically Signed     JJW/MEDQ  D:  11/11/2005  T:  11/15/2005  Job:  [id number]   cc:   C. Ulyess Mort, M.D.  Fax: [phone]

## 2011-04-09 NOTE — Cardiovascular Report (Signed)
Dylan Benton, Dylan Benton               ACCOUNT NO.:  [id number]   MEDICAL RECORD NO.:  [id number]          PATIENT TYPE:  OIB   LOCATION:  6501                         FACILITY:  [facility]   PHYSICIAN:  Arvilla Meres, M.D. LHCDATE OF BIRTH:  1941-06-14   DATE OF PROCEDURE:  12/09/2004  DATE OF DISCHARGE:                              CARDIAC CATHETERIZATION   PRIMARY CARE PHYSICIAN:  C. Ulyess Mort, M.D.   CARDIOLOGIST:  Nicholes Mango, M.D.   PATIENT IDENTIFICATION:  Dylan Benton is a very pleasant 70 year old male with  a history of hypertension and essential obesity, who has had a several  months' history of chest tightness that occurs with exertion or emotional  distress.  He was referred for further evaluation of possible coronary  artery disease.   PROCEDURES PERFORMED:  1.  Selective coronary angiography.  2.  Left heart catheterization.  3.  Left ventriculogram.   DESCRIPTION OF PROCEDURE:  The risks and benefits of the procedure were  explained to the patient.  A consent was signed and placed on the chart.  The right groin was prepped in a routine sterile fashion.  Subsequently, the  area was anesthetized with 1% local lidocaine.  A 4-French arterial sheath  was placed in the right femoral artery using a modified-Seldinger technique.  Standard catheters were used throughout the procedure, including 4-French  JL4, JR4, and bent pigtail.  All catheter exchanges were made over a wire.  There were no apparent complications.  At the end of the procedure, the  patient was transported to the holding area for removal of his arterial  sheath.   FINDINGS:  Aortic pressure was 102/63 with a mean of 81, LV pressure 107,  LVEDP 9.  There was no significant gradient across the aortic valve on  pullback.   CORONARY ANATOMY:  This was all a left dominant system.  The left main was  normal.  LAD was a large vessel with two small diagonals.  This was  angiographically normal.   Left  circumflex was a dominant vessel, gives off a small OM1 and a large  OM2.  There was also a normal-sized ramus intermedius.  There were small PLs  and PDA.  There is no angiographic CAD.   RCA:  This was a nondominant vessel without any significant coronary  atherosclerosis.   Left ventriculogram (shot in RAO approach):  Shows apparent left ventricular  hypertrophy with normal ejection fraction with an EF estimated to be greater  than or equal to 65%.  There is no significant mitral regurgitation.   ASSESSMENT/PLAN:  Normal coronary arteries.  Continue with ongoing risk  factor management.      DB/MEDQ  D:  12/09/2004  T:  12/09/2004  Job:  [id number]   cc:   C. Ulyess Mort, M.D.  [address]  [place]  [place] [address]  Fax: [phone]

## 2011-04-23 ENCOUNTER — Ambulatory Visit: Payer: Medicare Other | Admitting: Internal Medicine

## 2011-05-03 ENCOUNTER — Encounter: Payer: Self-pay | Admitting: Internal Medicine

## 2011-05-03 ENCOUNTER — Other Ambulatory Visit: Payer: Medicare Other

## 2011-05-03 ENCOUNTER — Ambulatory Visit (INDEPENDENT_AMBULATORY_CARE_PROVIDER_SITE_OTHER): Payer: Medicare Other | Admitting: Internal Medicine

## 2011-05-03 VITALS — BP 156/80 | HR 69 | Temp 96.9°F | Ht 76.0 in | Wt 293.7 lb

## 2011-05-03 DIAGNOSIS — Z862 Personal history of diseases of the blood and blood-forming organs and certain disorders involving the immune mechanism: Secondary | ICD-10-CM

## 2011-05-03 DIAGNOSIS — G5790 Unspecified mononeuropathy of unspecified lower limb: Secondary | ICD-10-CM

## 2011-05-03 DIAGNOSIS — M792 Neuralgia and neuritis, unspecified: Secondary | ICD-10-CM | POA: Insufficient documentation

## 2011-05-03 DIAGNOSIS — J45909 Unspecified asthma, uncomplicated: Secondary | ICD-10-CM

## 2011-05-03 DIAGNOSIS — I10 Essential (primary) hypertension: Secondary | ICD-10-CM

## 2011-05-03 DIAGNOSIS — Z8639 Personal history of other endocrine, nutritional and metabolic disease: Secondary | ICD-10-CM

## 2011-05-03 DIAGNOSIS — Z8546 Personal history of malignant neoplasm of prostate: Secondary | ICD-10-CM

## 2011-05-03 DIAGNOSIS — E669 Obesity, unspecified: Secondary | ICD-10-CM

## 2011-05-03 DIAGNOSIS — R9389 Abnormal findings on diagnostic imaging of other specified body structures: Secondary | ICD-10-CM

## 2011-05-03 LAB — LIPID PANEL
LDL Cholesterol: 122 mg/dL — ABNORMAL HIGH (ref 0–99)
Triglycerides: 84 mg/dL (ref <150)
VLDL: 17 mg/dL (ref 0–40)

## 2011-05-03 LAB — POCT GLYCOSYLATED HEMOGLOBIN (HGB A1C): Hemoglobin A1C: 5.6

## 2011-05-03 MED ORDER — AMITRIPTYLINE HCL 100 MG PO TABS: 100.0000 mg | ORAL_TABLET | Freq: Every day | ORAL | Status: DC

## 2011-05-03 NOTE — Assessment & Plan Note (Signed)
Volume status improved

## 2011-05-03 NOTE — Assessment & Plan Note (Signed)
He is on Lasix restarted by his PCP in appears to be doing well. He has lost about 30 pounds according to the patient noted a time with the help of Lasix. He is not scheduled for twice a day dosing which is usually prescribed for hypertension control with Lasix. I would not change his management at this time.

## 2011-05-03 NOTE — Assessment & Plan Note (Signed)
His throat pain and back pain appears to be a neurogenic character. This could come from compression, arthropathy, metabolic neuropathy. Given that hehas history of prostate cancer, and we'll perform MRI with and without contrast to rule out any metastasis. This would also help identify any compression, fracture, spinal stenosis. Her start him on telemetry we'll once a day the nighttime to treat his neurogenic symptom.

## 2011-05-03 NOTE — Assessment & Plan Note (Signed)
Repeat PSA. Doesn't appear to have any symptoms at this time.

## 2011-05-03 NOTE — Progress Notes (Signed)
  Subjective:    Patient ID: Dylan Benton, male    DOB: January 12, 1941, 70 y.o.   MRN: [id number]  HPI Patient is a 70 year old male who has history of hypertension, asthma, and prostate cancer. He is been suffering from left foot pain which appears to be a neurogenic character or for last few months. Patient reports that he is a history of back pain that radiates to the lower back area similar to what is usually described as sciatica.   Patient reports that the symptoms in the left fluid are mainly associated with burning, tingling, short shooting pains, feeling of coolness at times. Patient reports that his symptoms are variable in some days are better than the others. Patient follows with Dr. Fredric Mare at the [facility] for his prostate cancer. Patient reports that his cancer is not cured and he lives with it. Patient has history of surgery for prostate cancer, hormone therapy, followed by radiation.  Patient denies any significant symptoms on the right side leg. Patient denies having any CT scan or MRI done in recent times to evaluate he is back pain or foot symptoms. He also reports that he is not claustrophobic. Review of Systems  Constitutional: Negative for fever, chills, activity change and appetite change.  HENT: Negative for nosebleeds, facial swelling, neck pain and tinnitus.   Eyes: Negative for pain, discharge and visual disturbance.  Respiratory: Negative for cough, chest tightness and shortness of breath.   Cardiovascular: Negative for chest pain and palpitations.  Gastrointestinal: Negative for nausea, vomiting, abdominal pain, blood in stool and abdominal distention.  Skin: Negative for rash.  Neurological: Negative for dizziness, seizures, weakness and headaches.  Psychiatric/Behavioral: Negative for suicidal ideas, confusion and agitation.       Objective:   Physical Exam  Constitutional: He is oriented to person, place, and time. He appears well-developed and  well-nourished.  HENT:  Head: Normocephalic and atraumatic.  Right Ear: External ear normal.  Left Ear: External ear normal.  Eyes: Conjunctivae and EOM are normal. Pupils are equal, round, and reactive to light. Right eye exhibits no discharge. Left eye exhibits no discharge.  Neck: Normal range of motion. Neck supple. No thyromegaly present.  Cardiovascular: Normal rate and regular rhythm.   No murmur heard. Pulmonary/Chest: Effort normal and breath sounds normal. No respiratory distress. He has no wheezes. He has no rales.  Abdominal: Soft. Bowel sounds are normal. He exhibits no distension and no mass. There is no tenderness. There is no rebound and no guarding.  Musculoskeletal: Normal range of motion.       Valgus deformity of great toe  Neurological: He is alert and oriented to person, place, and time. He has normal strength and normal reflexes. No cranial nerve deficit or sensory deficit. He displays a negative Romberg sign. Coordination normal. GCS eye subscore is 4. GCS verbal subscore is 5. GCS motor subscore is 6.       SLR negative  Skin: No rash noted. He is not diaphoretic. No erythema.  Psychiatric: He has a normal mood and affect. His behavior is normal. Judgment and thought content normal.          Assessment & Plan:

## 2011-05-03 NOTE — Assessment & Plan Note (Signed)
Patient reports elevated CBG in recent times at urgent care. I do not see any evidence of prior workup for diabetes. I will check his A1 C. Especially given that he has symptoms of neuropathy for be important to rule out diabetic neuropathy.

## 2011-05-03 NOTE — Patient Instructions (Signed)
Return in one month We will refer you to neurologist once the MRI study of your back is complete

## 2011-05-03 NOTE — Assessment & Plan Note (Signed)
Report with the well-controlled. He rarely has to use albuterol inhaler.

## 2011-05-04 LAB — BASIC METABOLIC PANEL WITH GFR
Calcium: 9.5 mg/dL (ref 8.4–10.5)
Creat: 0.96 mg/dL (ref 0.50–1.35)
GFR, Est African American: >60 mL/min (ref >60)
Glucose, Bld: 111 mg/dL — ABNORMAL HIGH (ref 70–99)
Sodium: 140 mEq/L (ref 135–145)

## 2011-05-05 ENCOUNTER — Ambulatory Visit (HOSPITAL_COMMUNITY)
Admission: RE | Admit: 2011-05-05 | Discharge: 2011-05-05 | Disposition: A | Discharge status: Home / Self Care | Payer: Medicare Other | Source: Ambulatory Visit | Attending: Internal Medicine | Admitting: Internal Medicine

## 2011-05-05 DIAGNOSIS — M48061 Spinal stenosis, lumbar region without neurogenic claudication: Secondary | ICD-10-CM | POA: Insufficient documentation

## 2011-05-05 DIAGNOSIS — M7989 Other specified soft tissue disorders: Secondary | ICD-10-CM | POA: Insufficient documentation

## 2011-05-05 DIAGNOSIS — M79609 Pain in unspecified limb: Secondary | ICD-10-CM | POA: Insufficient documentation

## 2011-05-05 DIAGNOSIS — M47814 Spondylosis without myelopathy or radiculopathy, thoracic region: Secondary | ICD-10-CM | POA: Insufficient documentation

## 2011-05-05 DIAGNOSIS — M519 Unspecified thoracic, thoracolumbar and lumbosacral intervertebral disc disorder: Secondary | ICD-10-CM | POA: Insufficient documentation

## 2011-05-05 DIAGNOSIS — M792 Neuralgia and neuritis, unspecified: Secondary | ICD-10-CM

## 2011-05-05 DIAGNOSIS — R209 Unspecified disturbances of skin sensation: Secondary | ICD-10-CM | POA: Insufficient documentation

## 2011-05-05 MED ORDER — GADOBENATE DIMEGLUMINE 529 MG/ML IV SOLN
20.0000 mL | Freq: Once | INTRAVENOUS | Status: AC
  Administered 2011-05-05: 20 mL via INTRAVENOUS

## 2011-05-10 ENCOUNTER — Encounter: Payer: Medicare Other | Admitting: Internal Medicine

## 2011-06-02 ENCOUNTER — Encounter: Payer: Self-pay | Admitting: Internal Medicine

## 2011-06-03 ENCOUNTER — Telehealth: Payer: Self-pay | Admitting: *Deleted

## 2011-06-03 DIAGNOSIS — R9389 Abnormal findings on diagnostic imaging of other specified body structures: Secondary | ICD-10-CM

## 2011-06-03 NOTE — Telephone Encounter (Signed)
Received call from

## 2011-06-08 ENCOUNTER — Ambulatory Visit (HOSPITAL_COMMUNITY)
Admission: RE | Admit: 2011-06-08 | Discharge: 2011-06-08 | Disposition: A | Discharge status: Home / Self Care | Payer: Medicare Other | Source: Ambulatory Visit | Attending: Internal Medicine | Admitting: Internal Medicine

## 2011-06-08 DIAGNOSIS — I519 Heart disease, unspecified: Secondary | ICD-10-CM

## 2011-06-08 DIAGNOSIS — Z01818 Encounter for other preprocedural examination: Secondary | ICD-10-CM | POA: Insufficient documentation

## 2011-06-08 DIAGNOSIS — I079 Rheumatic tricuspid valve disease, unspecified: Secondary | ICD-10-CM | POA: Insufficient documentation

## 2011-06-08 DIAGNOSIS — R609 Edema, unspecified: Secondary | ICD-10-CM | POA: Insufficient documentation

## 2011-06-08 DIAGNOSIS — R0609 Other forms of dyspnea: Secondary | ICD-10-CM | POA: Insufficient documentation

## 2011-06-08 DIAGNOSIS — R0989 Other specified symptoms and signs involving the circulatory and respiratory systems: Secondary | ICD-10-CM | POA: Insufficient documentation

## 2011-06-19 ENCOUNTER — Other Ambulatory Visit: Payer: Self-pay | Admitting: Internal Medicine

## 2011-07-12 ENCOUNTER — Telehealth: Payer: Self-pay | Admitting: *Deleted

## 2011-07-12 NOTE — Telephone Encounter (Signed)
Call from patient regarding the status of his medical clearance that he had requested a few weeks ago.  Message left on pt's recorder to contact me back to give me further details of the surgery.  Criss Alvine, Gabbriella Presswood Cassady8/20/201211:26 AM

## 2011-07-30 ENCOUNTER — Encounter: Payer: Self-pay | Admitting: Internal Medicine

## 2011-08-03 NOTE — Telephone Encounter (Signed)
Medical clearance letter faxed on 07/30/11 to Marga Hoots at [facility].Kingsley Spittle Cassady9/11/201211:48 AM

## 2011-08-25 LAB — BASIC METABOLIC PANEL
BUN: 8
Calcium: 9.2
Creatinine, Ser: 1.12
GFR calc Af Amer: >60
GFR calc non Af Amer: >60

## 2011-08-25 LAB — HEMOGLOBIN AND HEMATOCRIT, BLOOD
HCT: 41.1
Hemoglobin: 13.7

## 2011-09-25 ENCOUNTER — Other Ambulatory Visit: Payer: Self-pay | Admitting: Internal Medicine

## 2011-09-29 ENCOUNTER — Other Ambulatory Visit: Payer: Self-pay | Admitting: Internal Medicine

## 2011-12-20 ENCOUNTER — Other Ambulatory Visit: Payer: Self-pay | Admitting: Internal Medicine

## 2011-12-20 NOTE — Telephone Encounter (Signed)
needs app't.  Refilled once.

## 2012-01-24 DIAGNOSIS — H2510 Age-related nuclear cataract, unspecified eye: Secondary | ICD-10-CM | POA: Diagnosis not present

## 2012-03-21 ENCOUNTER — Other Ambulatory Visit: Payer: Self-pay | Admitting: Internal Medicine

## 2012-03-21 NOTE — Telephone Encounter (Signed)
Dylan Benton's chart review lists an upcoming visit at [facility].  Has he decided to transfer his primary care?  If so, I will refill for the interval.

## 2012-04-18 DIAGNOSIS — C61 Malignant neoplasm of prostate: Secondary | ICD-10-CM | POA: Diagnosis not present

## 2012-04-18 DIAGNOSIS — Z87442 Personal history of urinary calculi: Secondary | ICD-10-CM | POA: Diagnosis not present

## 2012-04-18 DIAGNOSIS — N393 Stress incontinence (female) (male): Secondary | ICD-10-CM | POA: Diagnosis not present

## 2012-04-18 DIAGNOSIS — N320 Bladder-neck obstruction: Secondary | ICD-10-CM | POA: Diagnosis not present

## 2012-04-30 DIAGNOSIS — IMO0002 Reserved for concepts with insufficient information to code with codable children: Secondary | ICD-10-CM | POA: Insufficient documentation

## 2012-05-01 DIAGNOSIS — H4011X0 Primary open-angle glaucoma, stage unspecified: Secondary | ICD-10-CM | POA: Diagnosis not present

## 2012-05-01 DIAGNOSIS — H2510 Age-related nuclear cataract, unspecified eye: Secondary | ICD-10-CM | POA: Diagnosis not present

## 2012-05-01 DIAGNOSIS — H409 Unspecified glaucoma: Secondary | ICD-10-CM | POA: Diagnosis not present

## 2012-05-05 ENCOUNTER — Encounter: Payer: Self-pay | Admitting: Internal Medicine

## 2012-05-05 DIAGNOSIS — K219 Gastro-esophageal reflux disease without esophagitis: Secondary | ICD-10-CM | POA: Insufficient documentation

## 2012-05-05 DIAGNOSIS — M1712 Unilateral primary osteoarthritis, left knee: Secondary | ICD-10-CM | POA: Insufficient documentation

## 2012-05-05 DIAGNOSIS — D649 Anemia, unspecified: Secondary | ICD-10-CM | POA: Insufficient documentation

## 2012-05-05 DIAGNOSIS — F419 Anxiety disorder, unspecified: Secondary | ICD-10-CM | POA: Insufficient documentation

## 2012-05-05 DIAGNOSIS — Z0000 Encounter for general adult medical examination without abnormal findings: Secondary | ICD-10-CM | POA: Insufficient documentation

## 2012-05-05 DIAGNOSIS — N393 Stress incontinence (female) (male): Secondary | ICD-10-CM

## 2012-05-05 DIAGNOSIS — N210 Calculus in bladder: Secondary | ICD-10-CM

## 2012-05-05 DIAGNOSIS — R7302 Impaired glucose tolerance (oral): Secondary | ICD-10-CM | POA: Insufficient documentation

## 2012-05-05 DIAGNOSIS — K279 Peptic ulcer, site unspecified, unspecified as acute or chronic, without hemorrhage or perforation: Secondary | ICD-10-CM

## 2012-05-05 DIAGNOSIS — N529 Male erectile dysfunction, unspecified: Secondary | ICD-10-CM | POA: Insufficient documentation

## 2012-05-05 HISTORY — DX: Peptic ulcer, site unspecified, unspecified as acute or chronic, without hemorrhage or perforation: K27.9

## 2012-05-05 HISTORY — DX: Stress incontinence (female) (male): N39.3

## 2012-05-05 HISTORY — DX: Gastro-esophageal reflux disease without esophagitis: K21.9

## 2012-05-05 HISTORY — DX: Calculus in bladder: N21.0

## 2012-05-05 HISTORY — DX: Male erectile dysfunction, unspecified: N52.9

## 2012-05-05 HISTORY — DX: Impaired glucose tolerance (oral): R73.02

## 2012-05-08 ENCOUNTER — Other Ambulatory Visit (INDEPENDENT_AMBULATORY_CARE_PROVIDER_SITE_OTHER): Payer: Medicare Other

## 2012-05-08 ENCOUNTER — Encounter: Payer: Self-pay | Admitting: Internal Medicine

## 2012-05-08 ENCOUNTER — Ambulatory Visit (INDEPENDENT_AMBULATORY_CARE_PROVIDER_SITE_OTHER): Payer: Medicare Other | Admitting: Internal Medicine

## 2012-05-08 VITALS — BP 118/62 | HR 71 | Temp 98.2°F | Ht 75.0 in | Wt 292.2 lb

## 2012-05-08 DIAGNOSIS — R7302 Impaired glucose tolerance (oral): Secondary | ICD-10-CM

## 2012-05-08 DIAGNOSIS — I10 Essential (primary) hypertension: Secondary | ICD-10-CM

## 2012-05-08 DIAGNOSIS — R7309 Other abnormal glucose: Secondary | ICD-10-CM | POA: Diagnosis not present

## 2012-05-08 DIAGNOSIS — E785 Hyperlipidemia, unspecified: Secondary | ICD-10-CM | POA: Diagnosis not present

## 2012-05-08 DIAGNOSIS — M519 Unspecified thoracic, thoracolumbar and lumbosacral intervertebral disc disorder: Secondary | ICD-10-CM | POA: Insufficient documentation

## 2012-05-08 DIAGNOSIS — M5432 Sciatica, left side: Secondary | ICD-10-CM | POA: Insufficient documentation

## 2012-05-08 DIAGNOSIS — I77810 Thoracic aortic ectasia: Secondary | ICD-10-CM | POA: Diagnosis not present

## 2012-05-08 HISTORY — DX: Unspecified thoracic, thoracolumbar and lumbosacral intervertebral disc disorder: M51.9

## 2012-05-08 HISTORY — DX: Hyperlipidemia, unspecified: E78.5

## 2012-05-08 LAB — LIPID PANEL: Cholesterol: 155 mg/dL (ref 0–200)

## 2012-05-08 LAB — CBC WITH DIFFERENTIAL/PLATELET
Basophils Relative: 0.7 % (ref 0.0–3.0)
Eosinophils Relative: 1.5 % (ref 0.0–5.0)
HCT: 42.3 % (ref 39.0–52.0)
Lymphs Abs: 1.4 10*3/uL (ref 0.7–4.0)
Monocytes Relative: 9.9 % (ref 3.0–12.0)
Platelets: 175 10*3/uL (ref 150.0–400.0)
RBC: 4.4 Mil/uL (ref 4.22–5.81)
WBC: 3.6 10*3/uL — ABNORMAL LOW (ref 4.5–10.5)

## 2012-05-08 LAB — BASIC METABOLIC PANEL
BUN: 9 mg/dL (ref 6–23)
Calcium: 9.1 mg/dL (ref 8.4–10.5)
Creatinine, Ser: 1 mg/dL (ref 0.4–1.5)
GFR: 89.98 mL/min (ref >60.00)
Glucose, Bld: 120 mg/dL — ABNORMAL HIGH (ref 70–99)
Potassium: 3.7 mEq/L (ref 3.5–5.1)

## 2012-05-08 LAB — HEPATIC FUNCTION PANEL
AST: 21 U/L (ref 0–37)
Albumin: 3.9 g/dL (ref 3.5–5.2)
Total Bilirubin: 0.5 mg/dL (ref 0.3–1.2)

## 2012-05-08 LAB — URINALYSIS, ROUTINE W REFLEX MICROSCOPIC
Ketones, ur: NEGATIVE
Leukocytes, UA: NEGATIVE
Nitrite: NEGATIVE
Specific Gravity, Urine: 1.015 (ref 1.000–1.030)
Total Protein, Urine: NEGATIVE
pH: 6 (ref 5.0–8.0)

## 2012-05-08 LAB — TSH: TSH: 0.88 u[IU]/mL (ref 0.35–5.50)

## 2012-05-08 NOTE — Assessment & Plan Note (Signed)
Asympt, for f/u echo, last 2010

## 2012-05-08 NOTE — Assessment & Plan Note (Signed)
D/w pt , declines statin at this time, to follow better lower chol diet  Lab Results  Component Value Date   LDLCALC 122* 05/03/2011

## 2012-05-08 NOTE — Progress Notes (Signed)
Subjective:    Patient ID: Dylan Benton, male    DOB: [birth year], 71 y.o.   MRN: [id number]  HPI    Here for establish as new pt;  Overall doing ok;  Pt denies CP, worsening SOB, DOE, wheezing, orthopnea, PND, worsening LE edema, palpitations, dizziness or syncope.  Pt denies neurological change such as new Headache, facial or extremity weakness.  Pt denies polydipsia, polyuria, or low sugar symptoms. Pt states overall good compliance with treatment and medications, good tolerability, and trying to follow lower cholesterol diet.  Pt denies worsening depressive symptoms, suicidal ideation or panic. No fever, wt loss, night sweats, loss of appetite, or other constitutional symptoms.  Pt states good ability with ADL's, low fall risk, home safety reviewed and adequate, no significant changes in hearing or vision, and occasionally active with exercise.  Not taking his ASA every day but plans to start.  Has some very mild recurrent left knee swelling, but that and pain minimal, does not want further eval or tx.  Also with recurrent left sciaita not worse recent.  Tries to walk 1 mile about 3 times per wk on his home treadmill.  Has had low grade temp last wk, feel poorly, saw Dr Annabell Howells for this last wk, no infection found, did have PSA at .94.  No further fevers and feels better this wk.  Past Medical History  Diagnosis Date  . Anemia, unspecified     NOS ?resolved  . Prostate cancer     Hx of   . Hypertension   . Chest pain   . Aortic root dilation   . Obesity   . H pylori ulcer     teated H pylori  . Impaired glucose tolerance 05/05/2012  . Male stress incontinence 05/05/2012  . Erectile dysfunction 05/05/2012  . PUD (peptic ulcer disease) 05/05/2012  . GERD (gastroesophageal reflux disease) 05/05/2012  . ECHOCARDIOGRAM, ABNORMAL 12/26/2006     Aortic root dilation  This problem surfaced in 2006 after a cardiology referral for chest pain.  He was seen by Dr. Gala Romney in January o6 and cathed on Jan.  18.  Coronaries were nl and EF was 65%.  Echo showed mild aortic root dilation of 41mm. He was started on metoprolol (although I notice that he is no longer on this.  Annual echo to follow aortic root was advised.  Aortic root dimension was unchanged on studies in 2009 and 2010.  Will continue to follow this, perhaps not every year as it seems stable.   Marland Kitchen PROSTATE CANCER, HX OF 08/06/2009    Annotation: prostatectomy and radiotherapy in 1990s,  Dr. Annabell Howells Qualifier: Diagnosis of  By: Scot Dock MD, Madhav    . ASTHMA, UNSPECIFIED, UNSPECIFIED STATUS 08/06/2009    Annotation: No exacerbations.  Qualifier: Diagnosis of  By: Scot Dock MD, Madhav    . OBESITY 10/06/2006    Qualifier: Diagnosis of  By: Aundria Rud MD, Roseanne Reno    . 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 Lottie Dawson Qualifier: Diagnosis of  By: Scot Dock MD, Madhav    . Bladder calculus 05/05/2012  . Anxiety   . Arthritis   . Hyperlipidemia 05/08/2012  . Lumbar disc disease 05/08/2012   Past Surgical History  Procedure Date  . Eye surgury   . Ear surgury   . Prostatectomy     reports that he quit smoking about 23 years ago. He has never used smokeless tobacco. He reports that he does not drink alcohol or use  illicit drugs. family history includes Coronary artery disease in an unspecified family member; Hypertension in an unspecified family member; and Stroke in an unspecified family member. No Known Allergies Current Outpatient Prescriptions on File Prior to Visit  Medication Sig Dispense Refill  . albuterol (PROVENTIL,VENTOLIN) 90 MCG/ACT inhaler Inhale 2 puffs into the lungs every 6 (six) hours as needed. Use as directed       . COMBIGAN 0.2-0.5 % ophthalmic solution       . furosemide (LASIX) 40 MG tablet TAKE 1 TABLET BY MOUTH DAILY  90 tablet  0  . TRAVATAN Z 0.004 % ophthalmic solution       . VENTOLIN HFA 108 (90 BASE) MCG/ACT inhaler USE AS DIRECTED  2 Inhaler  1  . amitriptyline (ELAVIL) 100 MG  tablet Take 1 tablet (100 mg total) by mouth at bedtime.  30 tablet  3  . meloxicam (MOBIC) 7.5 MG tablet        Review of Systems Review of Systems  Constitutional: Negative for diaphoresis and unexpected weight change.  HENT: Negative for drooling and tinnitus.   Eyes: Negative for photophobia and visual disturbance.  Respiratory: Negative for choking and stridor.   Gastrointestinal: Negative for vomiting and blood in stool.  Genitourinary: Negative for hematuria and decreased urine volume.  Musculoskeletal: Negative for gait problem.  Skin: Negative for color change and wound.  Neurological: Negative for tremors and numbness.  Psychiatric/Behavioral: Negative for decreased concentration. The patient is not hyperactive.       Objective:   Physical Exam BP 118/62  Pulse 71  Temp 98.2 F (36.8 C) (Oral)  Ht 6\' 3"  (1.905 m)  Wt 292 lb 4 oz (132.564 kg)  BMI 36.53 kg/m2  SpO2 97% Physical Exam  VS noted Constitutional: Pt appears well-developed and well-nourished.  HENT: Head: Normocephalic.  Right Ear: External ear normal.  Left Ear: External ear normal.  Eyes: Conjunctivae and EOM are normal. Pupils are equal, round, and reactive to light.  Neck: Normal range of motion. Neck supple.  Cardiovascular: Normal rate and regular rhythm.   Pulmonary/Chest: Effort normal and breath sounds normal.  Abd:  Soft, NT, non-distended, + BS Neurological: Pt is alert. No cranial nerve deficit. motor/dtr intact Skin: Skin is warm. No erythema.  Psychiatric: Pt behavior is normal. Thought content normal.  Left knee without effusion    Assessment & Plan:

## 2012-05-08 NOTE — Assessment & Plan Note (Signed)
stable overall by hx and exam, most recent data reviewed with pt, and pt to continue medical treatment as before BP Readings from Last 3 Encounters:  05/08/12 118/62  05/03/11 156/80  12/19/09 154/79

## 2012-05-08 NOTE — Patient Instructions (Addendum)
Continue all other medications as before Please have the pharmacy call with any refills you may need. Please go to LAB in the Basement for the blood and/or urine tests to be done today You will be contacted regarding the referral for: echocardiogram You will be contacted by phone if any changes need to be made immediately.  Otherwise, you will receive a letter about your results with an explanation. Please continue your efforts at being more active, low cholesterol diet, and weight control. Please return in 6 months, or sooner if needed

## 2012-05-08 NOTE — Assessment & Plan Note (Signed)
stable overall by hx and exam, most recent data reviewed with pt, and pt to continue medical treatment as before Lab Results  Component Value Date   WBC 3.1* 01/20/2011   HGB 13.5 01/20/2011   HCT 40.2 01/20/2011   PLT 165 01/20/2011   GLUCOSE 111* 05/03/2011   CHOL 184 05/03/2011   TRIG 84 05/03/2011   HDL 45 05/03/2011   LDLCALC 122* 05/03/2011   NA 140 05/03/2011   K 4.3 05/03/2011   CL 103 05/03/2011   CREATININE 0.96 05/03/2011   BUN 10 05/03/2011   CO2 29 05/03/2011   PSA 0.84 05/03/2011   HGBA1C 5.6 05/03/2011   For a1c today

## 2012-05-19 ENCOUNTER — Other Ambulatory Visit (HOSPITAL_COMMUNITY): Payer: Self-pay | Admitting: Internal Medicine

## 2012-05-19 DIAGNOSIS — I77810 Thoracic aortic ectasia: Secondary | ICD-10-CM

## 2012-05-22 ENCOUNTER — Ambulatory Visit (HOSPITAL_COMMUNITY): Payer: Medicare Other | Attending: Cardiology | Admitting: Radiology

## 2012-05-22 DIAGNOSIS — I10 Essential (primary) hypertension: Secondary | ICD-10-CM | POA: Diagnosis not present

## 2012-05-22 DIAGNOSIS — I77810 Thoracic aortic ectasia: Secondary | ICD-10-CM | POA: Diagnosis not present

## 2012-05-22 DIAGNOSIS — E669 Obesity, unspecified: Secondary | ICD-10-CM | POA: Diagnosis not present

## 2012-05-22 DIAGNOSIS — I700 Atherosclerosis of aorta: Secondary | ICD-10-CM

## 2012-05-22 DIAGNOSIS — E785 Hyperlipidemia, unspecified: Secondary | ICD-10-CM | POA: Insufficient documentation

## 2012-05-22 NOTE — Progress Notes (Signed)
Echocardiogram performed.  

## 2012-05-29 ENCOUNTER — Encounter: Payer: Self-pay | Admitting: Internal Medicine

## 2012-05-30 ENCOUNTER — Other Ambulatory Visit (HOSPITAL_COMMUNITY): Payer: Medicare Other

## 2012-05-30 DIAGNOSIS — M6281 Muscle weakness (generalized): Secondary | ICD-10-CM | POA: Diagnosis not present

## 2012-05-30 DIAGNOSIS — N393 Stress incontinence (female) (male): Secondary | ICD-10-CM | POA: Diagnosis not present

## 2012-05-30 DIAGNOSIS — R279 Unspecified lack of coordination: Secondary | ICD-10-CM | POA: Diagnosis not present

## 2012-06-21 ENCOUNTER — Other Ambulatory Visit: Payer: Self-pay | Admitting: Internal Medicine

## 2012-07-10 DIAGNOSIS — M6281 Muscle weakness (generalized): Secondary | ICD-10-CM | POA: Diagnosis not present

## 2012-07-10 DIAGNOSIS — R279 Unspecified lack of coordination: Secondary | ICD-10-CM | POA: Diagnosis not present

## 2012-07-10 DIAGNOSIS — N393 Stress incontinence (female) (male): Secondary | ICD-10-CM | POA: Diagnosis not present

## 2012-07-25 ENCOUNTER — Emergency Department (INDEPENDENT_AMBULATORY_CARE_PROVIDER_SITE_OTHER)
Admission: EM | Admit: 2012-07-25 | Discharge: 2012-07-25 | Disposition: A | Discharge status: Home / Self Care | Payer: Medicare Other | Source: Home / Self Care | Attending: Family Medicine | Admitting: Family Medicine

## 2012-07-25 ENCOUNTER — Encounter (HOSPITAL_COMMUNITY): Payer: Self-pay | Admitting: Emergency Medicine

## 2012-07-25 DIAGNOSIS — H6120 Impacted cerumen, unspecified ear: Secondary | ICD-10-CM | POA: Diagnosis not present

## 2012-07-25 MED ORDER — NEOMYCIN-POLYMYXIN-HC 3.5-10000-1 OT SUSP: 4.0000 [drp] | Freq: Three times a day (TID) | OTIC | Status: AC

## 2012-07-25 NOTE — ED Provider Notes (Signed)
History     CSN: [id number]  Arrival date & time 07/25/12  0932   First MD Initiated Contact with Patient 07/25/12 1106      Chief Complaint  Patient presents with  . Otalgia    (Consider location/radiation/quality/duration/timing/severity/associated sxs/prior treatment) Patient is a 71 y.o. male presenting with ear pain. The history is provided by the patient. No language interpreter was used.  Otalgia This is a new problem. There is pain in the right ear. The problem occurs constantly. The problem has been gradually worsening. There has been no fever. The patient is experiencing no pain. Associated symptoms include ear discharge.    Past Medical History  Diagnosis Date  . Anemia, unspecified     NOS ?resolved  . Prostate cancer     Hx of   . Hypertension   . Chest pain   . Aortic root dilation   . Obesity   . H pylori ulcer     teated H pylori  . Impaired glucose tolerance 05/05/2012  . Male stress incontinence 05/05/2012  . Erectile dysfunction 05/05/2012  . PUD (peptic ulcer disease) 05/05/2012  . GERD (gastroesophageal reflux disease) 05/05/2012  . ECHOCARDIOGRAM, ABNORMAL 12/26/2006     Aortic root dilation  This problem surfaced in 2006 after a cardiology referral for chest pain.  He was seen by Dr. Gala Romney in January o6 and cathed on Jan. 18.  Coronaries were nl and EF was 65%.  Echo showed mild aortic root dilation of 41mm. He was started on metoprolol (although I notice that he is no longer on this.  Annual echo to follow aortic root was advised.  Aortic root dimension was unchanged on studies in 2009 and 2010.  Will continue to follow this, perhaps not every year as it seems stable.   Marland Kitchen PROSTATE CANCER, HX OF 08/06/2009    Annotation: prostatectomy and radiotherapy in 1990s,  Dr. Annabell Howells Qualifier: Diagnosis of  By: Scot Dock MD, Madhav    . ASTHMA, UNSPECIFIED, UNSPECIFIED STATUS 08/06/2009    Annotation: No exacerbations.  Qualifier: Diagnosis of  By: Scot Dock MD, Madhav    .  OBESITY 10/06/2006    Qualifier: Diagnosis of  By: Aundria Rud MD, Roseanne Reno    . 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 Lottie Dawson Qualifier: Diagnosis of  By: Scot Dock MD, Madhav    . Bladder calculus 05/05/2012  . Anxiety   . Arthritis   . Hyperlipidemia 05/08/2012  . Lumbar disc disease 05/08/2012    Past Surgical History  Procedure Date  . Eye surgury   . Ear surgury   . Prostatectomy     Family History  Problem Relation Age of Onset  . Coronary artery disease    . Hypertension    . Stroke      History  Substance Use Topics  . Smoking status: Former Smoker    Quit date: 12/02/1988  . Smokeless tobacco: Never Used  . Alcohol Use: No      Review of Systems  HENT: Positive for ear pain and ear discharge.   All other systems reviewed and are negative.    Allergies  Review of patient's allergies indicates no known allergies.  Home Medications   Current Outpatient Rx  Name Route Sig Dispense Refill  . WARFARIN SODIUM 2 MG PO TABS Oral Take 2 mg by mouth daily.    . ALBUTEROL 90 MCG/ACT IN AERS Inhalation Inhale 2 puffs into the lungs every 6 (six) hours as needed. Use  as directed     . AMITRIPTYLINE HCL 100 MG PO TABS Oral Take 1 tablet (100 mg total) by mouth at bedtime. 30 tablet 3  . COMBIGAN 0.2-0.5 % OP SOLN      . FUROSEMIDE 40 MG PO TABS  TAKE 1 TABLET BY MOUTH DAILY 90 tablet 3  . MELOXICAM 7.5 MG PO TABS      . TRAVATAN Z 0.004 % OP SOLN      . VENTOLIN HFA 108 (90 BASE) MCG/ACT IN AERS  USE AS DIRECTED 2 Inhaler 1    BP 132/75  Pulse 62  Temp 98.3 F (36.8 C) (Oral)  Resp 18  SpO2 97%  Physical Exam  Nursing note and vitals reviewed. Constitutional: He appears well-developed and well-nourished.  HENT:  Head: Normocephalic and atraumatic.  Right Ear: External ear normal.  Left Ear: External ear normal.  Nose: Nose normal.  Eyes: Conjunctivae and EOM are normal. Pupils are equal, round, and reactive to  light.  Neck: Normal range of motion. Neck supple.  Pulmonary/Chest: Effort normal and breath sounds normal.  Abdominal: Soft. Bowel sounds are normal.  Musculoskeletal: Normal range of motion.  Neurological: He is alert.  Skin: Skin is warm.  Psychiatric: He has a normal mood and affect.    ED Course  Procedures (including critical care time)  Labs Reviewed - No data to display No results found.   No diagnosis found.    MDM  Pt's ear irrigatted.  Pt feels better.  Pt given cortisporin otic.  I advised pt to follow up with his ent        Elson Areas, [place] 07/25/12 1238

## 2012-07-25 NOTE — ED Notes (Signed)
Pt c/o right ear pain. Had sx in 1990 and 2000 by Dr. Brion Aliment for "growth in my ear".

## 2012-07-26 NOTE — ED Provider Notes (Signed)
Medical screening examination/treatment/procedure(s) were performed by resident physician or non-physician practitioner and as supervising physician I was immediately available for consultation/collaboration.   Nacole Fluhr DOUGLAS MD.    Stephan Draughn D Saharah Sherrow, MD 07/26/12 2136 

## 2012-08-09 DIAGNOSIS — C61 Malignant neoplasm of prostate: Secondary | ICD-10-CM | POA: Diagnosis not present

## 2012-08-16 DIAGNOSIS — C61 Malignant neoplasm of prostate: Secondary | ICD-10-CM | POA: Diagnosis not present

## 2012-08-16 DIAGNOSIS — N529 Male erectile dysfunction, unspecified: Secondary | ICD-10-CM | POA: Diagnosis not present

## 2012-08-16 DIAGNOSIS — N320 Bladder-neck obstruction: Secondary | ICD-10-CM | POA: Diagnosis not present

## 2012-08-16 DIAGNOSIS — N393 Stress incontinence (female) (male): Secondary | ICD-10-CM | POA: Diagnosis not present

## 2012-08-21 DIAGNOSIS — H6120 Impacted cerumen, unspecified ear: Secondary | ICD-10-CM | POA: Diagnosis not present

## 2012-11-06 ENCOUNTER — Ambulatory Visit (INDEPENDENT_AMBULATORY_CARE_PROVIDER_SITE_OTHER): Payer: Medicare Other | Admitting: Internal Medicine

## 2012-11-06 ENCOUNTER — Encounter: Payer: Self-pay | Admitting: Internal Medicine

## 2012-11-06 VITALS — BP 132/72 | HR 72 | Temp 98.3°F | Ht 76.0 in | Wt 293.0 lb

## 2012-11-06 DIAGNOSIS — R7309 Other abnormal glucose: Secondary | ICD-10-CM

## 2012-11-06 DIAGNOSIS — M792 Neuralgia and neuritis, unspecified: Secondary | ICD-10-CM

## 2012-11-06 DIAGNOSIS — Z0000 Encounter for general adult medical examination without abnormal findings: Secondary | ICD-10-CM

## 2012-11-06 DIAGNOSIS — I10 Essential (primary) hypertension: Secondary | ICD-10-CM

## 2012-11-06 DIAGNOSIS — G5790 Unspecified mononeuropathy of unspecified lower limb: Secondary | ICD-10-CM

## 2012-11-06 DIAGNOSIS — R7302 Impaired glucose tolerance (oral): Secondary | ICD-10-CM

## 2012-11-06 DIAGNOSIS — E785 Hyperlipidemia, unspecified: Secondary | ICD-10-CM

## 2012-11-06 MED ORDER — AMITRIPTYLINE HCL 100 MG PO TABS: 100.0000 mg | ORAL_TABLET | Freq: Every day | ORAL | Status: DC

## 2012-11-06 MED ORDER — RANITIDINE HCL 75 MG PO TABS: 75.0000 mg | ORAL_TABLET | Freq: Every day | ORAL | Status: DC | PRN

## 2012-11-06 NOTE — Progress Notes (Signed)
Subjective:    Patient ID: Dylan Benton, male    DOB: 06/15/1941, 71 y.o.   MRN: [id number]  HPI  Here to f/u; overall doing ok,  Pt denies chest pain, increased sob or doe, wheezing, orthopnea, PND, increased LE swelling, palpitations, dizziness or syncope.  Pt denies new neurological symptoms such as new headache, or facial or extremity weakness or numbness   Pt denies polydipsia, polyuria, or low sugar symptoms such as weakness or confusion improved with po intake.  Pt states overall good compliance with meds, trying to follow lower cholesterol, diabetic diet, wt overall stable but little exercise however.  Has  Been seeing optho, has poor night vision, has 20/20 vision otherwise with glasses except has only 20% vision post trauma 1975 s/p 4 surguries - Dr Nile Riggs.  BP at home borderline elev, has spike to 150/s occasionally. Also with recurrent neurogenic pain to the left foot, out of elavil.  Has hadmild worsening reflux not taking his zantac 75, but no dysphagia, abd pain, n/v, bowel change or blood.   Of note, pt states he is not taking coumadin as is listed on his meds, was started then stopped about the time of cath 2006 per pt, and he has not been taking, has not been getting any INR checks, and states he was told he had no need for the med at one point.  Pt continues to have recurring left LBP without change in severity, bowel or bladder change, fever, wt loss,  worsening LE pain/numbness/weakness, gait change or falls. C/w his rucurring left sided neuropathic pain, was asked to lose wt in 2012 as this might help the pain, just has not been able to do that yet. Past Medical History  Diagnosis Date  . Anemia, unspecified     NOS ?resolved  . Prostate cancer     Hx of   . Hypertension   . Chest pain   . Aortic root dilation   . Obesity   . H pylori ulcer     teated H pylori  . Impaired glucose tolerance 05/05/2012  . Male stress incontinence 05/05/2012  . Erectile dysfunction 05/05/2012   . PUD (peptic ulcer disease) 05/05/2012  . GERD (gastroesophageal reflux disease) 05/05/2012  . ECHOCARDIOGRAM, ABNORMAL 12/26/2006     Aortic root dilation  This problem surfaced in 2006 after a cardiology referral for chest pain.  He was seen by Dr. Gala Romney in January o6 and cathed on Jan. 18.  Coronaries were nl and EF was 65%.  Echo showed mild aortic root dilation of 41mm. He was started on metoprolol (although I notice that he is no longer on this.  Annual echo to follow aortic root was advised.  Aortic root dimension was unchanged on studies in 2009 and 2010.  Will continue to follow this, perhaps not every year as it seems stable.   Marland Kitchen PROSTATE CANCER, HX OF 08/06/2009    Annotation: prostatectomy and radiotherapy in 1990s,  Dr. Annabell Howells Qualifier: Diagnosis of  By: Scot Dock MD, Madhav    . ASTHMA, UNSPECIFIED, UNSPECIFIED STATUS 08/06/2009    Annotation: No exacerbations.  Qualifier: Diagnosis of  By: Scot Dock MD, Madhav    . OBESITY 10/06/2006    Qualifier: Diagnosis of  By: Aundria Rud MD, Roseanne Reno    . 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 Lottie Dawson Qualifier: Diagnosis of  By: Scot Dock MD, Madhav    . Bladder calculus 05/05/2012  . Anxiety   . Arthritis   .  Hyperlipidemia 05/08/2012  . Lumbar disc disease 05/08/2012   Past Surgical History  Procedure Date  . Eye surgury   . Ear surgury   . Prostatectomy     reports that he quit smoking about 23 years ago. He has never used smokeless tobacco. He reports that he does not drink alcohol or use illicit drugs. family history includes Coronary artery disease in an unspecified family member; Hypertension in an unspecified family member; and Stroke in an unspecified family member. No Known Allergies Current Outpatient Prescriptions on File Prior to Visit  Medication Sig Dispense Refill  . albuterol (PROVENTIL,VENTOLIN) 90 MCG/ACT inhaler Inhale 2 puffs into the lungs every 6 (six) hours as needed. Use as  directed       . COMBIGAN 0.2-0.5 % ophthalmic solution       . furosemide (LASIX) 40 MG tablet TAKE 1 TABLET BY MOUTH DAILY  90 tablet  3  . meloxicam (MOBIC) 7.5 MG tablet       . TRAVATAN Z 0.004 % ophthalmic solution       . amitriptyline (ELAVIL) 100 MG tablet Take 1 tablet (100 mg total) by mouth at bedtime.  90 tablet  1  . ranitidine (ZANTAC 75) 75 MG tablet Take 1 tablet (75 mg total) by mouth daily as needed for heartburn.  90 tablet  3   Review of Systems  Constitutional: Negative for diaphoresis and unexpected weight change.  HENT: Negative for tinnitus.   Eyes: Negative for photophobia and visual disturbance.  Respiratory: Negative for choking and stridor.   Gastrointestinal: Negative for vomiting and blood in stool.  Genitourinary: Negative for hematuria and decreased urine volume.  Musculoskeletal: Negative for gait problem.  Skin: Negative for color change and wound.  Neurological: Negative for tremors and numbness.  Psychiatric/Behavioral: Negative for decreased concentration. The patient is not hyperactive.       Objective:   Physical Exam BP 132/72  Pulse 72  Temp 98.3 F (36.8 C) (Oral)  Ht 6\' 4"  (1.93 m)  Wt 293 lb (132.904 kg)  BMI 35.67 kg/m2  SpO2 95% Physical Exam  VS noted Constitutional: Pt appears well-developed and well-nourished.  HENT: Head: Normocephalic.  Right Ear: External ear normal.  Left Ear: External ear normal.  Eyes: Conjunctivae and EOM are normal. Pupils are equal, round, and reactive to light.  Neck: Normal range of motion. Neck supple.  Cardiovascular: Normal rate and regular rhythm.   Pulmonary/Chest: Effort normal and breath sounds normal.  Abd:  Soft, NT, non-distended, + BS Neurological: Pt is alert. Not confused  Skin: Skin is warm. No erythema.  Psychiatric: Pt behavior is normal. Thought content normal.     Assessment & Plan:

## 2012-11-06 NOTE — Assessment & Plan Note (Signed)
Ok qhs prn elavil asd,  to f/u any worsening symptoms or concerns

## 2012-11-06 NOTE — Patient Instructions (Addendum)
Take all new medications as prescribed - the generic elavil at night for the foot pain Please return or call if your left lower back and leg pain become worse  Continue all other medications as before Please have the pharmacy call with any other refills you may need. Please continue your efforts at being more active, low cholesterol diet, and weight control. You will be contacted regarding the referral for: colonoscopy Please return if you change your mind about the pneumonia shot You are otherwise up to date with prevention, and no need for further blood work today Thank you for enrolling in MyChart. Please follow the instructions below to securely access your online medical record. MyChart allows you to send messages to your doctor, view your test results, renew your prescriptions, schedule appointments, and more. To Log into MyChart, please go to [email or web address], and your Username is: [email or web address] Please return in 6 months, or sooner if needed

## 2012-11-06 NOTE — Assessment & Plan Note (Signed)
Borderline elev and I think should start beta blocker given the aortic root dilation but he decliens, plans to lose wt

## 2012-11-06 NOTE — Assessment & Plan Note (Signed)
Not charged - but due for colonospcy f/u - for referral

## 2012-11-11 ENCOUNTER — Encounter: Payer: Self-pay | Admitting: Internal Medicine

## 2012-11-11 NOTE — Assessment & Plan Note (Signed)
stable overall by hx and exam, most recent data reviewed with pt, and pt to continue medical treatment as before Lab Results  Component Value Date   HGBA1C 5.5 05/08/2012

## 2012-11-11 NOTE — Assessment & Plan Note (Signed)
stable overall by hx and exam, most recent data reviewed with pt, and pt to continue medical treatment as before Lab Results  Component Value Date   LDLCALC 92 05/08/2012

## 2013-01-29 DIAGNOSIS — H409 Unspecified glaucoma: Secondary | ICD-10-CM | POA: Diagnosis not present

## 2013-01-29 DIAGNOSIS — H4011X0 Primary open-angle glaucoma, stage unspecified: Secondary | ICD-10-CM | POA: Diagnosis not present

## 2013-01-30 ENCOUNTER — Other Ambulatory Visit: Payer: Self-pay | Admitting: Internal Medicine

## 2013-05-08 ENCOUNTER — Encounter: Payer: Self-pay | Admitting: Internal Medicine

## 2013-05-08 ENCOUNTER — Other Ambulatory Visit (INDEPENDENT_AMBULATORY_CARE_PROVIDER_SITE_OTHER): Payer: Medicare Other

## 2013-05-08 ENCOUNTER — Ambulatory Visit (INDEPENDENT_AMBULATORY_CARE_PROVIDER_SITE_OTHER): Payer: Medicare Other | Admitting: Internal Medicine

## 2013-05-08 VITALS — BP 122/68 | HR 63 | Temp 97.8°F | Ht 76.0 in | Wt 293.1 lb

## 2013-05-08 DIAGNOSIS — E785 Hyperlipidemia, unspecified: Secondary | ICD-10-CM

## 2013-05-08 DIAGNOSIS — G4710 Hypersomnia, unspecified: Secondary | ICD-10-CM | POA: Insufficient documentation

## 2013-05-08 DIAGNOSIS — Z860100 Personal history of colon polyps, unspecified: Secondary | ICD-10-CM | POA: Insufficient documentation

## 2013-05-08 DIAGNOSIS — C61 Malignant neoplasm of prostate: Secondary | ICD-10-CM

## 2013-05-08 DIAGNOSIS — I10 Essential (primary) hypertension: Secondary | ICD-10-CM

## 2013-05-08 DIAGNOSIS — R7302 Impaired glucose tolerance (oral): Secondary | ICD-10-CM

## 2013-05-08 DIAGNOSIS — R7309 Other abnormal glucose: Secondary | ICD-10-CM | POA: Diagnosis not present

## 2013-05-08 DIAGNOSIS — R5381 Other malaise: Secondary | ICD-10-CM | POA: Insufficient documentation

## 2013-05-08 DIAGNOSIS — M1712 Unilateral primary osteoarthritis, left knee: Secondary | ICD-10-CM

## 2013-05-08 DIAGNOSIS — Z8546 Personal history of malignant neoplasm of prostate: Secondary | ICD-10-CM

## 2013-05-08 DIAGNOSIS — R5383 Other fatigue: Secondary | ICD-10-CM

## 2013-05-08 LAB — BASIC METABOLIC PANEL
BUN: 9 mg/dL (ref 6–23)
Calcium: 9.5 mg/dL (ref 8.4–10.5)
Creatinine, Ser: 1 mg/dL (ref 0.4–1.5)
GFR: 96.1 mL/min (ref >60.00)

## 2013-05-08 LAB — URINALYSIS, ROUTINE W REFLEX MICROSCOPIC
Bilirubin Urine: NEGATIVE
Hgb urine dipstick: NEGATIVE
Ketones, ur: NEGATIVE
Leukocytes, UA: NEGATIVE
Nitrite: NEGATIVE
Specific Gravity, Urine: 1.025 (ref 1.000–1.030)
Total Protein, Urine: NEGATIVE
Urine Glucose: NEGATIVE
Urobilinogen, UA: 0.2 (ref 0.0–1.0)
WBC, UA: NONE SEEN (ref 0–?)
pH: 6 (ref 5.0–8.0)

## 2013-05-08 LAB — CBC WITH DIFFERENTIAL/PLATELET
Basophils Absolute: 0 10*3/uL (ref 0.0–0.1)
Eosinophils Absolute: 0.1 10*3/uL (ref 0.0–0.7)
Lymphocytes Relative: 39.9 % (ref 12.0–46.0)
MCHC: 33.5 g/dL (ref 30.0–36.0)
Monocytes Relative: 12.2 % — ABNORMAL HIGH (ref 3.0–12.0)
Neutrophils Relative %: 45.6 % (ref 43.0–77.0)
RDW: 12.6 % (ref 11.5–14.6)

## 2013-05-08 LAB — LIPID PANEL
Cholesterol: 172 mg/dL (ref 0–200)
HDL: 43.8 mg/dL (ref >39.00)
LDL Cholesterol: 111 mg/dL — ABNORMAL HIGH (ref 0–99)
Triglycerides: 85 mg/dL (ref 0.0–149.0)
VLDL: 17 mg/dL (ref 0.0–40.0)

## 2013-05-08 LAB — HEPATIC FUNCTION PANEL
ALT: 21 U/L (ref 0–53)
Albumin: 3.9 g/dL (ref 3.5–5.2)
Alkaline Phosphatase: 73 U/L (ref 39–117)
Bilirubin, Direct: 0.1 mg/dL (ref 0.0–0.3)
Total Protein: 6.9 g/dL (ref 6.0–8.3)

## 2013-05-08 LAB — TSH: TSH: 1.39 u[IU]/mL (ref 0.35–5.50)

## 2013-05-08 LAB — PSA: PSA: 1.03 ng/mL (ref 0.10–4.00)

## 2013-05-08 NOTE — Assessment & Plan Note (Addendum)
Etiology unclear, Exam otherwise benign, to check labs as documented, follow with expectant management  Note:  Total time for pt hx, exam, review of record with pt in the room, determination of diagnoses and plan for further eval and tx is > 40 min, with over 50% spent in coordination and counseling of patient  

## 2013-05-08 NOTE — Progress Notes (Addendum)
Subjective:    Patient ID: Dylan Benton, male    DOB: [birth year], 72 y.o.   MRN: [id number]  HPI  Here to f/u, Here to f/u; overall doing ok,  Pt denies chest pain, increased sob or doe, wheezing, orthopnea, PND, increased LE swelling, palpitations, dizziness or syncope.  Pt denies polydipsia, polyuria, or low sugar symptoms such as weakness or confusion improved with po intake.  Pt denies new neurological symptoms such as new headache, or facial or extremity weakness or numbness.   Pt states overall good compliance with meds, has been trying to follow lower cholesterol, diabetic diet, with wt overall stable,  but little exercise however.  Back in the gym for last 4 mo, breathing improved, but more fatigue, has not been able to lose wt. Does also have mild effuion and pain to left knee, tender left lateal epicondyly and left impingement type symptoms.  Has to wear socks at night as feet feel cold but not to touch, and socks make it better. Did not tolerate the elavil 100 due to sedation but did help neuropathy pains at night.  Does not want to try rx for lower dose at this time.Has known lumbar djd/ddd, last MRI on chart 2012, but stable overall lbp and LLE pain, mild, no gait change, has seen surgeon, offered disc surgury but he declined and did better with PT. Has significant daytime sleepiness, can fall asleep anytime during the day.  Decliens immunizations.  OK for colonoscopy, not called last yr per pt. Past Medical History  Diagnosis Date  . Anemia, unspecified     NOS ?resolved  . Prostate cancer     Hx of   . Hypertension   . Chest pain   . Aortic root dilation   . Obesity   . H pylori ulcer     teated H pylori  . Impaired glucose tolerance 05/05/2012  . Male stress incontinence 05/05/2012  . Erectile dysfunction 05/05/2012  . PUD (peptic ulcer disease) 05/05/2012  . GERD (gastroesophageal reflux disease) 05/05/2012  . ECHOCARDIOGRAM, ABNORMAL 12/26/2006     Aortic root dilation  This  problem surfaced in 2006 after a cardiology referral for chest pain.  He was seen by Dr. Gala Romney in January o6 and cathed on Jan. 18.  Coronaries were nl and EF was 65%.  Echo showed mild aortic root dilation of 41mm. He was started on metoprolol (although I notice that he is no longer on this.  Annual echo to follow aortic root was advised.  Aortic root dimension was unchanged on studies in 2009 and 2010.  Will continue to follow this, perhaps not every year as it seems stable.   Marland Kitchen PROSTATE CANCER, HX OF 08/06/2009    Annotation: prostatectomy and radiotherapy in 1990s,  Dr. Annabell Howells Qualifier: Diagnosis of  By: Scot Dock MD, Madhav    . ASTHMA, UNSPECIFIED, UNSPECIFIED STATUS 08/06/2009    Annotation: No exacerbations.  Qualifier: Diagnosis of  By: Scot Dock MD, Madhav    . OBESITY 10/06/2006    Qualifier: Diagnosis of  By: Aundria Rud MD, Roseanne Reno    . 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 Lottie Dawson Qualifier: Diagnosis of  By: Scot Dock MD, Madhav    . Bladder calculus 05/05/2012  . Anxiety   . Arthritis   . Hyperlipidemia 05/08/2012  . Lumbar disc disease 05/08/2012   Past Surgical History  Procedure Laterality Date  . Eye surgury    . Ear surgury    . Prostatectomy  reports that he quit smoking about 24 years ago. He has never used smokeless tobacco. He reports that he does not drink alcohol or use illicit drugs. family history includes Coronary artery disease in an unspecified family member; Hypertension in an unspecified family member; and Stroke in an unspecified family member. No Known Allergies Current Outpatient Prescriptions on File Prior to Visit  Medication Sig Dispense Refill  . albuterol (PROVENTIL,VENTOLIN) 90 MCG/ACT inhaler Inhale 2 puffs into the lungs every 6 (six) hours as needed. Use as directed       . COMBIGAN 0.2-0.5 % ophthalmic solution       . furosemide (LASIX) 40 MG tablet TAKE 1 TABLET BY MOUTH DAILY  90 tablet  3  . meloxicam  (MOBIC) 7.5 MG tablet       . ranitidine (ZANTAC 75) 75 MG tablet Take 1 tablet (75 mg total) by mouth daily as needed for heartburn.  90 tablet  3  . TRAVATAN Z 0.004 % ophthalmic solution       . VENTOLIN HFA 108 (90 BASE) MCG/ACT inhaler USE AS DIRECTED  18 each  1   No current facility-administered medications on file prior to visit.   Review of Systems  Constitutional: Negative for unexpected weight change, or unusual diaphoresis  HENT: Negative for tinnitus.   Eyes: Negative for photophobia and visual disturbance.  Respiratory: Negative for choking and stridor.   Gastrointestinal: Negative for vomiting and blood in stool.  Genitourinary: Negative for hematuria and decreased urine volume.  Musculoskeletal: Negative for acute joint swelling Skin: Negative for color change and wound.  Neurological: Negative for tremors and numbness other than noted  Psychiatric/Behavioral: Negative for decreased concentration or  hyperactivity.       Objective:   Physical Exam BP 122/68  Pulse 63  Temp(Src) 97.8 F (36.6 C) (Oral)  Ht 6\' 4"  (1.93 m)  Wt 293 lb 2 oz (132.961 kg)  BMI 35.7 kg/m2  SpO2 95% VS noted,  Constitutional: Pt appears well-developed and well-nourished.  HENT: Head: NCAT.  Right Ear: External ear normal.  Left Ear: External ear normal.  Eyes: Conjunctivae and EOM are normal. Pupils are equal, round, and reactive to light.  Neck: Normal range of motion. Neck supple.  Cardiovascular: Normal rate and regular rhythm.   Pulmonary/Chest: Effort normal and breath sounds normal.  Abd:  Soft, NT, non-distended, + BS Neurological: Pt is alert. Not confused  Skin: Skin is warm. No erythema.  Psychiatric: Pt behavior is normal. Thought content normal.  Tender left subachroamil - mild Tender left lateral epiconylar - mild Spine nontender \\left  knee with 1+ effusion, mild crepitus, no joint line tender, FROM    Assessment & Plan:  Quality Measures addressed:  Pneumonia  Vaccine: pt declines, may self-refer to local pharmacy

## 2013-05-08 NOTE — Assessment & Plan Note (Signed)
stable overall by history and exam, recent data reviewed with pt, and pt to continue medical treatment as before,  to f/u any worsening symptoms or concerns Lab Results  Component Value Date   LDLCALC 92 05/08/2012

## 2013-05-08 NOTE — Patient Instructions (Signed)
Please continue all other medications as before, and refills have been done if requested. No new medications today Please have the pharmacy call with any other refills you may need. Please go to the LAB in the Basement (turn left off the elevator) for the tests to be done today You will be contacted by phone if any changes need to be made immediately.  Otherwise, you will receive a letter about your results with an explanation  Thank you for enrolling in MyChart. Please follow the instructions below to securely access your online medical record. MyChart allows you to send messages to your doctor, view your test results, renew your prescriptions, schedule appointments, and more.  You will be contacted regarding the referral for: pulmonary for the sleepiness, and the colonoscopy  Please call if you change your mind about orthopedic for the left knee, left elbow and left shoulder  Please return in 6 months, or sooner if needed

## 2013-05-08 NOTE — Assessment & Plan Note (Signed)
Due for colonoscoyp - will re-order

## 2013-05-08 NOTE — Assessment & Plan Note (Signed)
stable overall by history and exam, recent data reviewed with pt, and pt to continue medical treatment as before,  to f/u any worsening symptoms or concerns Lab Results  Component Value Date   HGBA1C 5.5 05/08/2012

## 2013-05-08 NOTE — Assessment & Plan Note (Signed)
Mild, for tylenol prn, declines ortho

## 2013-05-08 NOTE — Assessment & Plan Note (Signed)
?   [facility] - for pulm referral 

## 2013-05-08 NOTE — Assessment & Plan Note (Signed)
Due for psa - will order

## 2013-05-08 NOTE — Assessment & Plan Note (Signed)
stable overall by history and exam, recent data reviewed with pt, and pt to continue medical treatment as before,  to f/u any worsening symptoms or concerns BP Readings from Last 3 Encounters:  05/08/13 122/68  11/06/12 132/72  07/25/12 132/75

## 2013-06-06 ENCOUNTER — Institutional Professional Consult (permissible substitution): Payer: Medicare Other | Admitting: Pulmonary Disease

## 2013-06-16 ENCOUNTER — Other Ambulatory Visit: Payer: Self-pay | Admitting: Internal Medicine

## 2013-06-27 ENCOUNTER — Other Ambulatory Visit: Payer: Self-pay

## 2013-07-11 ENCOUNTER — Encounter: Payer: Self-pay | Admitting: Gastroenterology

## 2013-07-20 ENCOUNTER — Encounter: Payer: Self-pay | Admitting: Gastroenterology

## 2013-08-16 DIAGNOSIS — C61 Malignant neoplasm of prostate: Secondary | ICD-10-CM | POA: Diagnosis not present

## 2013-08-24 ENCOUNTER — Ambulatory Visit (INDEPENDENT_AMBULATORY_CARE_PROVIDER_SITE_OTHER): Payer: Medicare Other | Admitting: Gastroenterology

## 2013-08-24 ENCOUNTER — Encounter: Payer: Self-pay | Admitting: Gastroenterology

## 2013-08-24 VITALS — BP 168/90 | HR 76 | Ht 76.0 in | Wt 291.0 lb

## 2013-08-24 DIAGNOSIS — Z1211 Encounter for screening for malignant neoplasm of colon: Secondary | ICD-10-CM | POA: Diagnosis not present

## 2013-08-24 MED ORDER — MOVIPREP 100 G PO SOLR: 1.0000 | Freq: Once | ORAL | Status: DC

## 2013-08-24 NOTE — Patient Instructions (Addendum)
You will be set up for a colonoscopy (MAC sedation, double prep) for colon cancer screening.

## 2013-08-24 NOTE — Progress Notes (Signed)
HPI: This is a  very pleasant 72 year old man whom I last saw about 8 years ago.  Cbc 04/2013 was normal   he had a colonoscopy 8 years ago, Christella Hartigan, incomplete due to poor prep, long colon. I was unable to see into the cecum however the distal aspect of the IC valve was visualized.  BArium enema 2007 was normal  Since then He has feeling of incomplete evacuation.  No overt bleeding.  Intentionally lost  A few pounds recently.  No colon cancer in his family.  Review of systems: Pertinent positive and negative review of systems were noted in the above HPI section. Complete review of systems was performed and was otherwise normal.    Past Medical History  Diagnosis Date  . Anemia, unspecified     NOS ?resolved  . Prostate cancer     Hx of   . Hypertension   . Chest pain   . Aortic root dilation   . Obesity   . H pylori ulcer     teated H pylori  . Impaired glucose tolerance 05/05/2012  . Male stress incontinence 05/05/2012  . Erectile dysfunction 05/05/2012  . PUD (peptic ulcer disease) 05/05/2012  . GERD (gastroesophageal reflux disease) 05/05/2012  . ECHOCARDIOGRAM, ABNORMAL 12/26/2006     Aortic root dilation  This problem surfaced in 2006 after a cardiology referral for chest pain.  He was seen by Dr. Gala Romney in January o6 and cathed on Jan. 18.  Coronaries were nl and EF was 65%.  Echo showed mild aortic root dilation of 41mm. He was started on metoprolol (although I notice that he is no longer on this.  Annual echo to follow aortic root was advised.  Aortic root dimension was unchanged on studies in 2009 and 2010.  Will continue to follow this, perhaps not every year as it seems stable.   Marland Kitchen PROSTATE CANCER, HX OF 08/06/2009    Annotation: prostatectomy and radiotherapy in 1990s,  Dr. Annabell Howells Qualifier: Diagnosis of  By: Scot Dock MD, Madhav    . ASTHMA, UNSPECIFIED, UNSPECIFIED STATUS 08/06/2009    Annotation: No exacerbations.  Qualifier: Diagnosis of  By: Scot Dock MD, Madhav    .  OBESITY 10/06/2006    Qualifier: Diagnosis of  By: Aundria Rud MD, Roseanne Reno    . 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 Lottie Dawson Qualifier: Diagnosis of  By: Scot Dock MD, Madhav    . Bladder calculus 05/05/2012  . Anxiety   . Arthritis   . Hyperlipidemia 05/08/2012  . Lumbar disc disease 05/08/2012    Past Surgical History  Procedure Laterality Date  . Eye surgury    . Ear surgury    . Prostatectomy      Current Outpatient Prescriptions  Medication Sig Dispense Refill  . albuterol (PROVENTIL,VENTOLIN) 90 MCG/ACT inhaler Inhale 2 puffs into the lungs every 6 (six) hours as needed. Use as directed       . COMBIGAN 0.2-0.5 % ophthalmic solution       . furosemide (LASIX) 40 MG tablet TAKE 1 TABLET BY MOUTH DAILY  90 tablet  3  . ranitidine (ZANTAC 75) 75 MG tablet Take 1 tablet (75 mg total) by mouth daily as needed for heartburn.  90 tablet  3  . TRAVATAN Z 0.004 % ophthalmic solution        No current facility-administered medications for this visit.    Allergies as of 08/24/2013  . (No Known Allergies)    Family History  Problem  Relation Age of Onset  . Coronary artery disease    . Hypertension    . Stroke      History   Social History  . Marital Status: Married    Spouse Name: N/A    Number of Children: 2  . Years of Education: 14   Occupational History  . Lobbyist (Part-time)    Social History Main Topics  . Smoking status: Former Smoker    Quit date: 12/02/1988  . Smokeless tobacco: Never Used  . Alcohol Use: No  . Drug Use: No  . Sexual Activity: Not on file   Other Topics Concern  . Not on file   Social History Narrative   Works for [employer] at [employer] in [place].       Physical Exam: BP 168/90  Pulse 76  Ht 6\' 4"  (1.93 m)  Wt 291 lb (131.997 kg)  BMI 35.44 kg/m2 Constitutional: generally well-appearing Psychiatric: alert and oriented x3 Eyes: extraocular movements intact Mouth: oral pharynx  moist, no lesions Neck: supple no lymphadenopathy Cardiovascular: heart regular rate and rhythm Lungs: clear to auscultation bilaterally Abdomen: soft, nontender, nondistended, no obvious ascites, no peritoneal signs, normal bowel sounds Extremities: no lower extremity edema bilaterally Skin: no lesions on visible extremities    Assessment and plan: 72 y.o. male with  routine risk for colon cancer  He had incomplete colonoscopy with followup barium enema 8 years ago. That was do to poor prep and tortuous, long colon. No polyps were seen. I think it is reasonable to repeat colonoscopy at this point, I will plan to use double prep and MAC sedation to try to ensure complete examination this time. If it is normal, negative without polyps or not many polyps that he would probably not require further colonoscopies for screening or surveillance.

## 2013-08-27 DIAGNOSIS — H409 Unspecified glaucoma: Secondary | ICD-10-CM | POA: Diagnosis not present

## 2013-08-27 DIAGNOSIS — H2510 Age-related nuclear cataract, unspecified eye: Secondary | ICD-10-CM | POA: Diagnosis not present

## 2013-08-27 DIAGNOSIS — H4011X0 Primary open-angle glaucoma, stage unspecified: Secondary | ICD-10-CM | POA: Diagnosis not present

## 2013-08-29 DIAGNOSIS — R82998 Other abnormal findings in urine: Secondary | ICD-10-CM | POA: Diagnosis not present

## 2013-08-29 DIAGNOSIS — C61 Malignant neoplasm of prostate: Secondary | ICD-10-CM | POA: Diagnosis not present

## 2013-08-29 DIAGNOSIS — N393 Stress incontinence (female) (male): Secondary | ICD-10-CM | POA: Diagnosis not present

## 2013-08-29 DIAGNOSIS — N529 Male erectile dysfunction, unspecified: Secondary | ICD-10-CM | POA: Diagnosis not present

## 2013-09-21 ENCOUNTER — Encounter: Payer: Self-pay | Admitting: Gastroenterology

## 2013-09-21 ENCOUNTER — Ambulatory Visit (AMBULATORY_SURGERY_CENTER): Payer: No Typology Code available for payment source | Admitting: Gastroenterology

## 2013-09-21 VITALS — BP 134/70 | HR 65 | Temp 97.1°F | Resp 12 | Ht 76.0 in | Wt 291.0 lb

## 2013-09-21 DIAGNOSIS — Z1211 Encounter for screening for malignant neoplasm of colon: Secondary | ICD-10-CM

## 2013-09-21 DIAGNOSIS — D126 Benign neoplasm of colon, unspecified: Secondary | ICD-10-CM

## 2013-09-21 DIAGNOSIS — K648 Other hemorrhoids: Secondary | ICD-10-CM

## 2013-09-21 DIAGNOSIS — E669 Obesity, unspecified: Secondary | ICD-10-CM | POA: Diagnosis not present

## 2013-09-21 DIAGNOSIS — J45909 Unspecified asthma, uncomplicated: Secondary | ICD-10-CM | POA: Diagnosis not present

## 2013-09-21 MED ORDER — SODIUM CHLORIDE 0.9 % IV SOLN: 500.0000 mL | INTRAVENOUS | Status: DC

## 2013-09-21 NOTE — Op Note (Signed)
[facility] [address] [place], [address]   COLONOSCOPY PROCEDURE REPORT  PATIENT: Dylan Benton, Dylan Benton  MR#: [id number] BIRTHDATE: [birth year] , 72  yrs. old GENDER: Male ENDOSCOPIST: Rachael Fee, MD PROCEDURE DATE:  09/21/2013 PROCEDURE:   Colonoscopy with snare polypectomy First Screening Colonoscopy - Avg.  risk and is 50 yrs.  old or older - No.  Prior Negative Screening - Now for repeat screening. Other: See Comments  History of Adenoma - Now for follow-up colonoscopy & has been > or = to 3 yrs.  N/A  Polyps Removed Today? Yes. ASA CLASS:   Class II INDICATIONS:colonoscopy 8 years ago, Christella Hartigan, incomplete due to poor prep, long colon.  I was unable to see into the cecum however the distal aspect of the IC valve was visualized.  BArium enema 2007 was normal. MEDICATIONS: MAC sedation, administered by CRNA and Propofol (Diprivan) 240 mg IV  DESCRIPTION OF PROCEDURE:   After the risks benefits and alternatives of the procedure were thoroughly explained, informed consent was obtained.  A digital rectal exam revealed no abnormalities of the rectum.   The LB [id number]  endoscope was introduced through the anus and advanced to the cecum, which was identified by both the appendix and ileocecal valve. No adverse events experienced.   The quality of the prep was good.  The instrument was then slowly withdrawn as the colon was fully examined.  COLON FINDINGS: One polyp was found, removed and sent to pathology. This was in transverse segment, 8mm across, sessile, removed with sanre/cautery.  There were medium sized internal hemorrhoids. Retroflexed views revealed no abnormalities. The time to cecum=8 minutes 04 seconds.  Withdrawal time=11 minutes 50 seconds.  The scope was withdrawn and the procedure completed. COMPLICATIONS: There were no complications.  ENDOSCOPIC IMPRESSION: One polyp was found, removed and sent to pathology. There were medium  sized internal hemorrhoids.  RECOMMENDATIONS: Given your age, you will probably not need another colonoscopy for colon cancer screening or polyp surveillance.  These types of tests usually stop around the age 38.  You will receive a letter within 1-2 weeks with the results of your biopsy as well as final recommendations.  Please call my office if you have not received a letter after 3 weeks.   eSigned:  Rachael Fee, MD 09/21/2013 9:40 AM   cc: Oliver Barre, MD

## 2013-09-21 NOTE — Progress Notes (Signed)
Patient did not experience any of the following events: a burn prior to discharge; a fall within the facility; wrong site/side/patient/procedure/implant event; or a hospital transfer or hospital admission upon discharge from the facility. (G8907) Patient did not have preoperative order for IV antibiotic SSI prophylaxis. (G8918)  

## 2013-09-21 NOTE — Progress Notes (Signed)
Procedure ends, to recovery, report given and VSS. 

## 2013-09-21 NOTE — Patient Instructions (Signed)
Discharge instructions given with verbal understanding. Handouts on polyps and hemorrhoids. Resume previous medications. YOU HAD AN ENDOSCOPIC PROCEDURE TODAY AT THE [facility]: Refer to the procedure report that was given to you for any specific questions about what was found during the examination.  If the procedure report does not answer your questions, please call your gastroenterologist to clarify.  If you requested that your care partner not be given the details of your procedure findings, then the procedure report has been included in a sealed envelope for you to review at your convenience later.  YOU SHOULD EXPECT: Some feelings of bloating in the abdomen. Passage of more gas than usual.  Walking can help get rid of the air that was put into your GI tract during the procedure and reduce the bloating. If you had a lower endoscopy (such as a colonoscopy or flexible sigmoidoscopy) you may notice spotting of blood in your stool or on the toilet paper. If you underwent a bowel prep for your procedure, then you may not have a normal bowel movement for a few days.  DIET: Your first meal following the procedure should be a light meal and then it is ok to progress to your normal diet.  A half-sandwich or bowl of soup is an example of a good first meal.  Heavy or fried foods are harder to digest and may make you feel nauseous or bloated.  Likewise meals heavy in dairy and vegetables can cause extra gas to form and this can also increase the bloating.  Drink plenty of fluids but you should avoid alcoholic beverages for 24 hours.  ACTIVITY: Your care partner should take you home directly after the procedure.  You should plan to take it easy, moving slowly for the rest of the day.  You can resume normal activity the day after the procedure however you should NOT DRIVE or use heavy machinery for 24 hours (because of the sedation medicines used during the test).    SYMPTOMS TO REPORT  IMMEDIATELY: A gastroenterologist can be reached at any hour.  During normal business hours, 8:30 AM to 5:00 PM Monday through Friday, call [phone].  After hours and on weekends, please call the GI answering service at [phone] who will take a message and have the physician on call contact you.   Following lower endoscopy (colonoscopy or flexible sigmoidoscopy):  Excessive amounts of blood in the stool  Significant tenderness or worsening of abdominal pains  Swelling of the abdomen that is new, acute  Fever of 100F or higher  FOLLOW UP: If any biopsies were taken you will be contacted by phone or by letter within the next 1-3 weeks.  Call your gastroenterologist if you have not heard about the biopsies in 3 weeks.  Our staff will call the home number listed on your records the next business day following your procedure to check on you and address any questions or concerns that you may have at that time regarding the information given to you following your procedure. This is a courtesy call and so if there is no answer at the home number and we have not heard from you through the emergency physician on call, we will assume that you have returned to your regular daily activities without incident.  SIGNATURES/CONFIDENTIALITY: You and/or your care partner have signed paperwork which will be entered into your electronic medical record.  These signatures attest to the fact that that the information above on your After Visit Summary   has been reviewed and is understood.  Full responsibility of the confidentiality of this discharge information lies with you and/or your care-partner. 

## 2013-09-21 NOTE — Progress Notes (Signed)
Called to room to assist during endoscopic procedure.  Patient ID and intended procedure confirmed with present staff. Received instructions for my participation in the procedure from the performing physician.  

## 2013-09-24 ENCOUNTER — Telehealth: Payer: Self-pay

## 2013-09-24 NOTE — Telephone Encounter (Signed)
  Follow up Call-  Call back number 09/21/2013  Post procedure Call Back phone  # [phone]  Permission to leave phone message Yes     Patient questions:  Do you have a fever, pain , or abdominal swelling? no Pain Score  0 *  Have you tolerated food without any problems? yes  Have you been able to return to your normal activities? yes  Do you have any questions about your discharge instructions: Diet   no Medications  no Follow up visit  no  Do you have questions or concerns about your Care? no  Actions: * If pain score is 4 or above: No action needed, pain <4.

## 2013-09-26 ENCOUNTER — Encounter: Payer: Self-pay | Admitting: Gastroenterology

## 2013-09-27 ENCOUNTER — Other Ambulatory Visit: Payer: Self-pay

## 2013-10-02 ENCOUNTER — Telehealth: Payer: Self-pay | Admitting: Gastroenterology

## 2013-10-02 NOTE — Telephone Encounter (Signed)
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 Per Dr Christella Hartigan the pt will not need any further testing due to age.  He will call with any further GI complaints or questions

## 2013-10-04 ENCOUNTER — Telehealth: Payer: Self-pay | Admitting: Gastroenterology

## 2013-10-04 NOTE — Telephone Encounter (Signed)
Pt was advised to do Sitz baths as often as possible and use Prep H for his hemorrhoids, he will use miralax to control his constipation.  Pt will call if no better by Monday

## 2013-11-07 ENCOUNTER — Ambulatory Visit (INDEPENDENT_AMBULATORY_CARE_PROVIDER_SITE_OTHER): Payer: Medicare Other | Admitting: Internal Medicine

## 2013-11-07 ENCOUNTER — Encounter: Payer: Self-pay | Admitting: Internal Medicine

## 2013-11-07 VITALS — BP 118/68 | HR 67 | Temp 97.1°F | Ht 75.0 in | Wt 290.0 lb

## 2013-11-07 DIAGNOSIS — I10 Essential (primary) hypertension: Secondary | ICD-10-CM | POA: Diagnosis not present

## 2013-11-07 DIAGNOSIS — R7309 Other abnormal glucose: Secondary | ICD-10-CM | POA: Diagnosis not present

## 2013-11-07 DIAGNOSIS — E785 Hyperlipidemia, unspecified: Secondary | ICD-10-CM

## 2013-11-07 DIAGNOSIS — R7302 Impaired glucose tolerance (oral): Secondary | ICD-10-CM

## 2013-11-07 NOTE — Assessment & Plan Note (Signed)
stable overall by history and exam, recent data reviewed with pt, and pt to continue medical treatment as before,  to f/u any worsening symptoms or concerns  BP Readings from Last 3 Encounters:  11/07/13 118/68  09/21/13 134/70  08/24/13 168/90

## 2013-11-07 NOTE — Assessment & Plan Note (Signed)
stable overall by history and exam, recent data reviewed with pt, and pt to continue medical treatment as before,  to f/u any worsening symptoms or concerns Lab Results  Component Value Date   HGBA1C 5.5 05/08/2013

## 2013-11-07 NOTE — Patient Instructions (Signed)
Please continue all other medications as before, and refills have been done if requested. Please have the pharmacy call with any other refills you may need. Please continue your efforts at being more active, low cholesterol diet, and weight control. You are otherwise up to date with prevention measures today.  No further lab work needed today  Please keep your appointments with your specialists as you have planned - urology  Please return in 6 months, or sooner if needed

## 2013-11-07 NOTE — Progress Notes (Signed)
Subjective:    Patient ID: Dylan Benton, male    DOB: [birth year], 72 y.o.   MRN: [id number]  HPI  Here to f/u; overall doing ok,  Pt denies chest pain, increased sob or doe, wheezing, orthopnea, PND, increased LE swelling, palpitations, dizziness or syncope.  Pt denies polydipsia, polyuria, or low sugar symptoms such as weakness or confusion improved with po intake.  Pt denies new neurological symptoms such as new headache, or facial or extremity weakness or numbness.   Pt states overall good compliance with meds, has been trying to follow lower cholesterol diet, with wt overall stable,  but little exercise however. Had been working on the treadmill but had to stop due to flare of left LBP, has known lumbar disc dz, has been rec'd for surgury before, plans to get a second opinion with Dr Wainer/orthopedic. Declines labs today or immuinzations. Past Medical History  Diagnosis Date  . Anemia, unspecified     NOS ?resolved  . Prostate cancer     Hx of   . Hypertension   . Chest pain   . Aortic root dilation   . Obesity   . H pylori ulcer     teated H pylori  . Impaired glucose tolerance 05/05/2012  . Male stress incontinence 05/05/2012  . Erectile dysfunction 05/05/2012  . PUD (peptic ulcer disease) 05/05/2012  . GERD (gastroesophageal reflux disease) 05/05/2012  . ECHOCARDIOGRAM, ABNORMAL 12/26/2006     Aortic root dilation  This problem surfaced in 2006 after a cardiology referral for chest pain.  He was seen by Dr. Gala Romney in January o6 and cathed on Jan. 18.  Coronaries were nl and EF was 65%.  Echo showed mild aortic root dilation of 41mm. He was started on metoprolol (although I notice that he is no longer on this.  Annual echo to follow aortic root was advised.  Aortic root dimension was unchanged on studies in 2009 and 2010.  Will continue to follow this, perhaps not every year as it seems stable.   Marland Kitchen PROSTATE CANCER, HX OF 08/06/2009    Annotation: prostatectomy and radiotherapy in  1990s,  Dr. Annabell Howells Qualifier: Diagnosis of  By: Scot Dock MD, Madhav    . ASTHMA, UNSPECIFIED, UNSPECIFIED STATUS 08/06/2009    Annotation: No exacerbations.  Qualifier: Diagnosis of  By: Scot Dock MD, Madhav    . OBESITY 10/06/2006    Qualifier: Diagnosis of  By: Aundria Rud MD, Roseanne Reno    . 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 Lottie Dawson Qualifier: Diagnosis of  By: Scot Dock MD, Madhav    . Bladder calculus 05/05/2012  . Anxiety   . Arthritis   . Hyperlipidemia 05/08/2012  . Lumbar disc disease 05/08/2012  . Allergy   . Cataract   . Myocardial infarction     pt states, "i've been told I have had two heart attacks  . History of back surgery    Past Surgical History  Procedure Laterality Date  . Eye surgury    . Ear surgury    . Prostatectomy    . Back surgery      reports that he quit smoking about 24 years ago. He has never used smokeless tobacco. He reports that he does not drink alcohol or use illicit drugs. family history includes Coronary artery disease in an other family member; Hypertension in an other family member; Stroke in an other family member. There is no history of Colon cancer, Esophageal cancer, Rectal cancer, or Stomach  cancer. No Known Allergies Current Outpatient Prescriptions on File Prior to Visit  Medication Sig Dispense Refill  . albuterol (PROVENTIL,VENTOLIN) 90 MCG/ACT inhaler Inhale 2 puffs into the lungs every 6 (six) hours as needed. Use as directed       . COMBIGAN 0.2-0.5 % ophthalmic solution       . furosemide (LASIX) 40 MG tablet TAKE 1 TABLET BY MOUTH DAILY  90 tablet  3  . ranitidine (ZANTAC 75) 75 MG tablet Take 1 tablet (75 mg total) by mouth daily as needed for heartburn.  90 tablet  3  . TRAVATAN Z 0.004 % ophthalmic solution        No current facility-administered medications on file prior to visit.   Did see urology approx 3-4 mo ago, sees yearly now with PSA.  Review of Systems  Constitutional: Negative for  unexpected weight change, or unusual diaphoresis  HENT: Negative for tinnitus.   Eyes: Negative for photophobia and visual disturbance.  Respiratory: Negative for choking and stridor.   Gastrointestinal: Negative for vomiting and blood in stool.  Genitourinary: Negative for hematuria and decreased urine volume.  Musculoskeletal: Negative for acute joint swelling Skin: Negative for color change and wound.  Neurological: Negative for tremors and numbness other than noted  Psychiatric/Behavioral: Negative for decreased concentration or  hyperactivity.       Objective:   Physical Exam BP 118/68  Pulse 67  Temp(Src) 97.1 F (36.2 C) (Oral)  Ht 6\' 3"  (1.905 m)  Wt 290 lb (131.543 kg)  BMI 36.25 kg/m2  SpO2 96%  VS noted,  Constitutional: Pt appears well-developed and well-nourished.  HENT: Head: NCAT.  Right Ear: External ear normal.  Left Ear: External ear normal.  Eyes: Conjunctivae and EOM are normal. Pupils are equal, round, and reactive to light.  Neck: Normal range of motion. Neck supple.  Cardiovascular: Normal rate and regular rhythm.   Pulmonary/Chest: Effort normal and breath sounds normal.  Abd:  Soft, NT, non-distended, + BS Neurological: Pt is alert. Not confused  Skin: Skin is warm. No erythema.  Psychiatric: Pt behavior is normal. Thought content normal.     Assessment & Plan:

## 2013-11-07 NOTE — Assessment & Plan Note (Signed)
stable overall by history and exam, recent data reviewed with pt, and pt to continue medical treatment as before,  to f/u any worsening symptoms or concerns Lab Results  Component Value Date   LDLCALC 111* 05/08/2013   For better diet, declines other med change, goal < 100

## 2013-11-07 NOTE — Progress Notes (Signed)
Pre-visit discussion using our clinic review tool. No additional management support is needed unless otherwise documented below in the visit note.  

## 2014-03-11 DIAGNOSIS — H409 Unspecified glaucoma: Secondary | ICD-10-CM | POA: Diagnosis not present

## 2014-03-11 DIAGNOSIS — H4011X0 Primary open-angle glaucoma, stage unspecified: Secondary | ICD-10-CM | POA: Diagnosis not present

## 2014-03-11 DIAGNOSIS — H2510 Age-related nuclear cataract, unspecified eye: Secondary | ICD-10-CM | POA: Diagnosis not present

## 2014-05-08 ENCOUNTER — Encounter: Payer: Self-pay | Admitting: Internal Medicine

## 2014-05-08 ENCOUNTER — Ambulatory Visit (INDEPENDENT_AMBULATORY_CARE_PROVIDER_SITE_OTHER): Payer: Medicare Other | Admitting: Internal Medicine

## 2014-05-08 ENCOUNTER — Other Ambulatory Visit (INDEPENDENT_AMBULATORY_CARE_PROVIDER_SITE_OTHER): Payer: Medicare Other

## 2014-05-08 VITALS — BP 122/80 | HR 71 | Temp 98.2°F | Ht 76.0 in | Wt 294.5 lb

## 2014-05-08 DIAGNOSIS — E785 Hyperlipidemia, unspecified: Secondary | ICD-10-CM | POA: Diagnosis not present

## 2014-05-08 DIAGNOSIS — I77810 Thoracic aortic ectasia: Secondary | ICD-10-CM | POA: Diagnosis not present

## 2014-05-08 DIAGNOSIS — M5432 Sciatica, left side: Secondary | ICD-10-CM

## 2014-05-08 DIAGNOSIS — I10 Essential (primary) hypertension: Secondary | ICD-10-CM | POA: Diagnosis not present

## 2014-05-08 DIAGNOSIS — R7302 Impaired glucose tolerance (oral): Secondary | ICD-10-CM

## 2014-05-08 DIAGNOSIS — C61 Malignant neoplasm of prostate: Secondary | ICD-10-CM

## 2014-05-08 DIAGNOSIS — M5430 Sciatica, unspecified side: Secondary | ICD-10-CM

## 2014-05-08 DIAGNOSIS — R7309 Other abnormal glucose: Secondary | ICD-10-CM

## 2014-05-08 DIAGNOSIS — R21 Rash and other nonspecific skin eruption: Secondary | ICD-10-CM

## 2014-05-08 LAB — CBC WITH DIFFERENTIAL/PLATELET
BASOS ABS: 0 10*3/uL (ref 0.0–0.1)
Basophils Relative: 0.6 % (ref 0.0–3.0)
EOS PCT: 3 % (ref 0.0–5.0)
Eosinophils Absolute: 0.1 10*3/uL (ref 0.0–0.7)
HCT: 42 % (ref 39.0–52.0)
HEMOGLOBIN: 14 g/dL (ref 13.0–17.0)
LYMPHS PCT: 40.2 % (ref 12.0–46.0)
Lymphs Abs: 1.5 10*3/uL (ref 0.7–4.0)
MCHC: 33.3 g/dL (ref 30.0–36.0)
MCV: 96.4 fl (ref 78.0–100.0)
Monocytes Absolute: 0.4 10*3/uL (ref 0.1–1.0)
Monocytes Relative: 10.4 % (ref 3.0–12.0)
NEUTROS ABS: 1.8 10*3/uL (ref 1.4–7.7)
Neutrophils Relative %: 45.8 % (ref 43.0–77.0)
Platelets: 211 10*3/uL (ref 150.0–400.0)
RBC: 4.36 Mil/uL (ref 4.22–5.81)
RDW: 12.6 % (ref 11.5–15.5)
WBC: 3.9 10*3/uL — ABNORMAL LOW (ref 4.0–10.5)

## 2014-05-08 LAB — BASIC METABOLIC PANEL
BUN: 9 mg/dL (ref 6–23)
CALCIUM: 9.5 mg/dL (ref 8.4–10.5)
CO2: 30 mEq/L (ref 19–32)
Chloride: 103 mEq/L (ref 96–112)
Creatinine, Ser: 0.9 mg/dL (ref 0.4–1.5)
GFR: 100.56 mL/min (ref >60.00)
Glucose, Bld: 109 mg/dL — ABNORMAL HIGH (ref 70–99)
Potassium: 4.1 mEq/L (ref 3.5–5.1)
SODIUM: 141 meq/L (ref 135–145)

## 2014-05-08 LAB — LIPID PANEL
CHOL/HDL RATIO: 4
CHOLESTEROL: 177 mg/dL (ref 0–200)
HDL: 41.4 mg/dL (ref >39.00)
LDL CALC: 111 mg/dL — AB (ref 0–99)
NonHDL: 135.6
Triglycerides: 125 mg/dL (ref 0.0–149.0)
VLDL: 25 mg/dL (ref 0.0–40.0)

## 2014-05-08 LAB — URINALYSIS, ROUTINE W REFLEX MICROSCOPIC
BILIRUBIN URINE: NEGATIVE
Hgb urine dipstick: NEGATIVE
Ketones, ur: NEGATIVE
Leukocytes, UA: NEGATIVE
NITRITE: NEGATIVE
RBC / HPF: NONE SEEN (ref 0–?)
SPECIFIC GRAVITY, URINE: 1.02 (ref 1.000–1.030)
TOTAL PROTEIN, URINE-UPE24: NEGATIVE
Urine Glucose: NEGATIVE
Urobilinogen, UA: 1 (ref 0.0–1.0)
pH: 6 (ref 5.0–8.0)

## 2014-05-08 LAB — HEPATIC FUNCTION PANEL
ALK PHOS: 75 U/L (ref 39–117)
ALT: 19 U/L (ref 0–53)
AST: 23 U/L (ref 0–37)
Albumin: 4.2 g/dL (ref 3.5–5.2)
BILIRUBIN TOTAL: 0.5 mg/dL (ref 0.2–1.2)
Bilirubin, Direct: 0.1 mg/dL (ref 0.0–0.3)
Total Protein: 6.9 g/dL (ref 6.0–8.3)

## 2014-05-08 LAB — HEMOGLOBIN A1C: Hgb A1c MFr Bld: 5.9 % (ref 4.6–6.5)

## 2014-05-08 LAB — TSH: TSH: 1.52 u[IU]/mL (ref 0.35–4.50)

## 2014-05-08 LAB — PSA: PSA: 1.12 ng/mL (ref 0.10–4.00)

## 2014-05-08 MED ORDER — TRIAMCINOLONE ACETONIDE 0.1 % EX CREA: 1.0000 "application " | TOPICAL_CREAM | Freq: Two times a day (BID) | CUTANEOUS | Status: DC

## 2014-05-08 NOTE — Progress Notes (Signed)
Subjective:    Patient ID: Dylan Benton, male    DOB: 09/18/1941, 73 y.o.   MRN: [id number]  HPI  Here for yearly f/u;  Overall doing ok;  Pt denies CP, worsening SOB, DOE, wheezing, orthopnea, PND, worsening LE edema, palpitations, dizziness or syncope.  Pt denies neurological change such as new headache, facial or extremity weakness.  Pt denies polydipsia, polyuria, or low sugar symptoms. Pt states overall good compliance with treatment and medications, good tolerability, and has been trying to follow lower cholesterol diet.  Pt denies worsening depressive symptoms, suicidal ideation or panic. No fever, night sweats, wt loss, loss of appetite, or other constitutional symptoms.  Pt states good ability with ADL's, has low fall risk, home safety reviewed and adequate, no other significant changes in hearing or vision, and only occasionally active with exercise.  Goes to gym 3-4 times per wk.  Pt continues to have left LBP with occas radiation to the left foot/toes, but no bowel or bladder change, fever, wt loss,  worsening LE numbness/weakness, gait change or falls. Plans to see Dr Ronnald Ramp NS for surgury soon, was rec'd before and he had deferred. Declines Prevnar. Past Medical History  Diagnosis Date  . Anemia, unspecified     NOS ?resolved  . Prostate cancer     Hx of   . Hypertension   . Chest pain   . Aortic root dilation   . Obesity   . H pylori ulcer     teated H pylori  . Impaired glucose tolerance 05/05/2012  . Male stress incontinence 05/05/2012  . Erectile dysfunction 05/05/2012  . PUD (peptic ulcer disease) 05/05/2012  . GERD (gastroesophageal reflux disease) 05/05/2012  . ECHOCARDIOGRAM, ABNORMAL 12/26/2006     Aortic root dilation  This problem surfaced in 2006 after a cardiology referral for chest pain.  He was seen by Dr. Haroldine Laws in January o6 and cathed on Jan. 18.  Coronaries were nl and EF was 65%.  Echo showed mild aortic root dilation of 65mm. He was started on metoprolol  (although I notice that he is no longer on this.  Annual echo to follow aortic root was advised.  Aortic root dimension was unchanged on studies in 2009 and 2010.  Will continue to follow this, perhaps not every year as it seems stable.   Marland Kitchen PROSTATE CANCER, HX OF 08/06/2009    Annotation: prostatectomy and radiotherapy in 1990s,  Dr. Jeffie Pollock Qualifier: Diagnosis of  By: Kelton Pillar MD, [place]    . ASTHMA, UNSPECIFIED, UNSPECIFIED STATUS 08/06/2009    Annotation: No exacerbations.  Qualifier: Diagnosis of  By: Kelton Pillar MD, Irena    . OBESITY 10/06/2006    Qualifier: Diagnosis of  By: Stann Mainland MD, Nicole Kindred    . 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 Edilia Bo Qualifier: Diagnosis of  By: Kelton Pillar MD, [place]    . Bladder calculus 05/05/2012  . Anxiety   . Arthritis   . Hyperlipidemia 05/08/2012  . Lumbar disc disease 05/08/2012  . Allergy   . Cataract   . Myocardial infarction     pt states, "i've been told I have had two heart attacks  . History of back surgery    Past Surgical History  Procedure Laterality Date  . Eye surgury    . Ear surgury    . Prostatectomy    . Back surgery      reports that he quit smoking about 25 years ago. He has never used  smokeless tobacco. He reports that he does not drink alcohol or use illicit drugs. family history includes Coronary artery disease in an other family member; Hypertension in an other family member; Stroke in an other family member. There is no history of Colon cancer, Esophageal cancer, Rectal cancer, or Stomach cancer. No Known Allergies Current Outpatient Prescriptions on File Prior to Visit  Medication Sig Dispense Refill  . albuterol (PROVENTIL,VENTOLIN) 90 MCG/ACT inhaler Inhale 2 puffs into the lungs every 6 (six) hours as needed. Use as directed       . COMBIGAN 0.2-0.5 % ophthalmic solution       . furosemide (LASIX) 40 MG tablet TAKE 1 TABLET BY MOUTH DAILY  90 tablet  3  . ranitidine (ZANTAC 75) 75 MG  tablet Take 1 tablet (75 mg total) by mouth daily as needed for heartburn.  90 tablet  3  . TRAVATAN Z 0.004 % ophthalmic solution        No current facility-administered medications on file prior to visit.    Review of Systems  Constitutional: Negative for unusual diaphoresis or other sweats  HENT: Negative for ringing in ear Eyes: Negative for double vision or worsening visual disturbance.  Respiratory: Negative for choking and stridor.   Gastrointestinal: Negative for vomiting or other signifcant bowel change Genitourinary: Negative for hematuria or decreased urine volume.  Musculoskeletal: Negative for other MSK pain or swelling Skin: Negative for color change and worsening wound.  Neurological: Negative for tremors and numbness other than noted  Psychiatric/Behavioral: Negative for decreased concentration or agitation other than above       Objective:   Physical Exam BP 122/80  Pulse 71  Temp(Src) 98.2 F (36.8 C) (Oral)  Ht 6\' 4"  (1.93 m)  Wt 294 lb 8 oz (133.584 kg)  BMI 35.86 kg/m2  SpO2 95% VS noted,  Constitutional: Pt appears well-developed, well-nourished. Annabell Sabal HENT: Head: NCAT.  Right Ear: External ear normal.  Left Ear: External ear normal.  Eyes: . Pupils are equal, round, and reactive to light. Conjunctivae and EOM are normal Neck: Normal range of motion. Neck supple.  Cardiovascular: Normal rate and regular rhythm.   Pulmonary/Chest: Effort normal and breath sounds normal.  Abd:  Soft, NT, ND, + BS Neurological: Pt is alert. Not confused , motor grossly intact Skin: Skin is warm. + rash to right lower back 2 cm scaly erythema, NT, non swelling Psychiatric: Pt behavior is normal. No agitation.      Assessment & Plan:

## 2014-05-08 NOTE — Progress Notes (Signed)
Pre visit review using our clinic review tool, if applicable. No additional management support is needed unless otherwise documented below in the visit note. 

## 2014-05-08 NOTE — Patient Instructions (Signed)
Please take all new medication as prescribed - the mild steroid cream  Please call for dermatology referral if this is not helpful  Please continue all other medications as before, and refills have been done if requested.  Please have the pharmacy call with any other refills you may need.  Please continue your efforts at being more active, low cholesterol diet, and weight control.  You are otherwise up to date with prevention measures today.  Please keep your appointments with your specialists as you may have planned - Dr Lemont Fillers will be contacted regarding the referral for: echocardiogram, and cardiology  Please go to the LAB in the Basement (turn left off the elevator) for the tests to be done today  You will be contacted by phone if any changes need to be made immediately.  Otherwise, you will receive a letter about your results with an explanation, but please check with MyChart first.  Please remember to sign up for MyChart if you have not done so, as this will be important to you in the future with finding out test results, communicating by private email, and scheduling acute appointments online when needed.  Please return in 6 months, or sooner if needed

## 2014-05-09 ENCOUNTER — Telehealth: Payer: Self-pay | Admitting: Internal Medicine

## 2014-05-09 DIAGNOSIS — C61 Malignant neoplasm of prostate: Secondary | ICD-10-CM | POA: Insufficient documentation

## 2014-05-09 DIAGNOSIS — R21 Rash and other nonspecific skin eruption: Secondary | ICD-10-CM | POA: Insufficient documentation

## 2014-05-09 NOTE — Assessment & Plan Note (Signed)
stable overall by history and exam, recent data reviewed with pt, and pt to continue medical treatment as before,  to f/u any worsening symptoms or concerns BP Readings from Last 3 Encounters:  05/08/14 122/80  11/07/13 118/68  09/21/13 134/70

## 2014-05-09 NOTE — Telephone Encounter (Signed)
Relevant patient education mailed to patient.  

## 2014-05-09 NOTE — Assessment & Plan Note (Signed)
Dylan Benton for surgury if ok with cardiology

## 2014-05-09 NOTE — Assessment & Plan Note (Signed)
stable overall by history and exam, recent data reviewed with pt, and pt to continue medical treatment as before,  to f/u any worsening symptoms or concerns Lab Results  Component Value Date   LDLCALC 111* 05/08/2014   For f/u lab, lower chol diet

## 2014-05-09 NOTE — Assessment & Plan Note (Signed)
Also for psa as he is due 

## 2014-05-09 NOTE — Assessment & Plan Note (Signed)
stable overall by history and exam, recent data reviewed with pt, and pt to continue medical treatment as before,  to f/u any worsening symptoms or concerns For a1c Lab Results  Component Value Date   HGBA1C 5.9 05/08/2014

## 2014-05-09 NOTE — Assessment & Plan Note (Signed)
C/w dermatitis right lower back - for triam cr prn

## 2014-05-09 NOTE — Assessment & Plan Note (Addendum)
For echo f/u as > 1 yr, also card referral, o/w no CP, asympt,  to f/u any worsening symptoms or concerns  Note:  Total time for pt hx, exam, review of record with pt in the room, determination of diagnoses and plan for further eval and tx is > 40 min, with over 50% spent in coordination and counseling of patient

## 2014-05-17 ENCOUNTER — Encounter: Payer: Self-pay | Admitting: Cardiovascular Disease

## 2014-05-17 ENCOUNTER — Ambulatory Visit (INDEPENDENT_AMBULATORY_CARE_PROVIDER_SITE_OTHER): Payer: Medicare Other | Admitting: Cardiovascular Disease

## 2014-05-17 VITALS — BP 128/70 | HR 71 | Ht 76.0 in | Wt 293.8 lb

## 2014-05-17 DIAGNOSIS — R0602 Shortness of breath: Secondary | ICD-10-CM | POA: Diagnosis not present

## 2014-05-17 DIAGNOSIS — I712 Thoracic aortic aneurysm, without rupture, unspecified: Secondary | ICD-10-CM

## 2014-05-17 DIAGNOSIS — R9389 Abnormal findings on diagnostic imaging of other specified body structures: Secondary | ICD-10-CM | POA: Diagnosis not present

## 2014-05-17 NOTE — Patient Instructions (Signed)
  We will see you back in follow up only as needed.   Dr Gwenlyn Found has ordered : 1.  Echocardiogram. Echocardiography is a painless test that uses sound waves to create images of your heart. It provides your doctor with information about the size and shape of your heart and how well your heart's chambers and valves are working. This procedure takes approximately one hour. There are no restrictions for this procedure.   2. Non-Cardiac CT Angiography (CTA) of the chest, is a special type of CT scan that uses a computer to produce multi-dimensional views of major blood vessels throughout the body. In CT angiography, a contrast material is injected through an IV to help visualize the blood vessels

## 2014-05-17 NOTE — Assessment & Plan Note (Signed)
Followed by his PCP 

## 2014-05-17 NOTE — Assessment & Plan Note (Signed)
Only on diuretic, followed by his PCP

## 2014-05-17 NOTE — Assessment & Plan Note (Signed)
History of thoracic aortic dilatation failed in 2006 during a cardiac catheterization which turned out to be normal with normal ejection fraction. He has not had this adequately followed since. I'm going to get a 2-D echocardiogram and a CT angiogram of his thoracic aorta. If this has  shown no growth I will see him back when necessary

## 2014-05-17 NOTE — Progress Notes (Signed)
05/17/2014 Dylan Benton   10-01-1941  [id number]  Primary Physician Cathlean Cower, MD Primary Cardiologist: Lorretta Harp MD Renae Gloss   HPI:  Dylan Benton is a 73 year old moderately overweight married African American male father of 2, grandfather and 4 grandchildren referred by Dr. Cathlean Cower, his PCP, for cardiovascular evaluation because of a known dilated thoracic aortic root. His crit was factor profile is notable for 25 pack years of tobacco abuse having quit back in 1990. He has hypertension on diuretic and apparently hyperlipidemia not treated. He has never had a heart attack or stroke. He has had a cart cardiac catheterization in 2006 because of chest pain revealed normal renal arteries and normal left ventricular function. At that time he was noted to have a dilated thoracic aorta which is the measure by 2-D echo and an CT angiography in the past but not recently. His only symptoms are dyspnea on exertion which probably is related to his remote tobacco abuse   Current Outpatient Prescriptions  Medication Sig Dispense Refill  . albuterol (PROVENTIL,VENTOLIN) 90 MCG/ACT inhaler Inhale 2 puffs into the lungs every 6 (six) hours as needed. Use as directed       . COMBIGAN 0.2-0.5 % ophthalmic solution       . furosemide (LASIX) 40 MG tablet TAKE 1 TABLET BY MOUTH DAILY  90 tablet  3  . ranitidine (ZANTAC 75) 75 MG tablet Take 1 tablet (75 mg total) by mouth daily as needed for heartburn.  90 tablet  3  . TRAVATAN Z 0.004 % ophthalmic solution       . triamcinolone cream (KENALOG) 0.1 % Apply 1 application topically 2 (two) times daily.  30 g  0   No current facility-administered medications for this visit.    No Known Allergies  History   Social History  . Marital Status: Married    Spouse Name: N/A    Number of Children: 2  . Years of Education: 14   Occupational History  . Camera operator (Part-time)    Social History Main Topics  . Smoking status:  Former Smoker    Quit date: 12/02/1988  . Smokeless tobacco: Never Used  . Alcohol Use: No  . Drug Use: No  . Sexual Activity: Not on file   Other Topics Concern  . Not on file   Social History Narrative   Works for [employer] at [employer] in [place].     Review of Systems: General: negative for chills, fever, night sweats or weight changes.  Cardiovascular: negative for chest pain, dyspnea on exertion, edema, orthopnea, palpitations, paroxysmal nocturnal dyspnea or shortness of breath Dermatological: negative for rash Respiratory: negative for cough or wheezing Urologic: negative for hematuria Abdominal: negative for nausea, vomiting, diarrhea, bright red blood per rectum, melena, or hematemesis Neurologic: negative for visual changes, syncope, or dizziness All other systems reviewed and are otherwise negative except as noted above.    Blood pressure 128/70, pulse 71, height 6\' 4"  (1.93 m), weight 293 lb 12.8 oz (133.267 kg).  General appearance: alert and no distress Neck: no adenopathy, no carotid bruit, no JVD, supple, symmetrical, trachea midline and thyroid not enlarged, symmetric, no tenderness/mass/nodules Lungs: clear to auscultation bilaterally Heart: regular rate and rhythm, S1, S2 normal, no murmur, click, rub or gallop Extremities: extremities normal, atraumatic, no cyanosis or edema  EKG normal sinus rhythm at 71 without ST or T wave changes  ASSESSMENT AND PLAN:   ECHOCARDIOGRAM, ABNORMAL History of thoracic aortic  dilatation failed in 2006 during a cardiac catheterization which turned out to be normal with normal ejection fraction. He has not had this adequately followed since. I'm going to get a 2-D echocardiogram and a CT angiogram of his thoracic aorta. If this has  shown no growth I will see him back when necessary  HYPERTENSION Only on diuretic, followed by his PCP  Hyperlipidemia Followed by his PCP      Lorretta Harp MD [facility],  [facility] 05/17/2014 9:05 AM

## 2014-05-23 ENCOUNTER — Ambulatory Visit
Admission: RE | Admit: 2014-05-23 | Discharge: 2014-05-23 | Disposition: A | Discharge status: Home / Self Care | Payer: Medicare Other | Source: Ambulatory Visit | Attending: Cardiovascular Disease | Admitting: Cardiovascular Disease

## 2014-05-23 DIAGNOSIS — R0602 Shortness of breath: Secondary | ICD-10-CM

## 2014-05-23 DIAGNOSIS — I712 Thoracic aortic aneurysm, without rupture, unspecified: Secondary | ICD-10-CM

## 2014-05-23 DIAGNOSIS — I719 Aortic aneurysm of unspecified site, without rupture: Secondary | ICD-10-CM | POA: Diagnosis not present

## 2014-05-23 DIAGNOSIS — R9389 Abnormal findings on diagnostic imaging of other specified body structures: Secondary | ICD-10-CM

## 2014-05-23 MED ORDER — IOHEXOL 350 MG/ML SOLN
80.0000 mL | Freq: Once | INTRAVENOUS | Status: AC | PRN
  Administered 2014-05-23: 80 mL via INTRAVENOUS

## 2014-05-29 ENCOUNTER — Ambulatory Visit (HOSPITAL_COMMUNITY)
Admission: RE | Admit: 2014-05-29 | Discharge: 2014-05-29 | Disposition: A | Discharge status: Home / Self Care | Payer: Medicare Other | Source: Ambulatory Visit | Attending: Cardiology | Admitting: Cardiology

## 2014-05-29 DIAGNOSIS — R0602 Shortness of breath: Secondary | ICD-10-CM | POA: Diagnosis not present

## 2014-05-29 DIAGNOSIS — I712 Thoracic aortic aneurysm, without rupture, unspecified: Secondary | ICD-10-CM

## 2014-05-29 DIAGNOSIS — R9389 Abnormal findings on diagnostic imaging of other specified body structures: Secondary | ICD-10-CM | POA: Insufficient documentation

## 2014-05-29 DIAGNOSIS — I369 Nonrheumatic tricuspid valve disorder, unspecified: Secondary | ICD-10-CM | POA: Diagnosis not present

## 2014-05-29 NOTE — Progress Notes (Signed)
2D Echo Performed 05/29/2014    Naama Sappington, RCS  

## 2014-06-14 ENCOUNTER — Other Ambulatory Visit: Payer: Self-pay | Admitting: Internal Medicine

## 2014-06-19 ENCOUNTER — Encounter: Payer: Self-pay | Admitting: Internal Medicine

## 2014-06-19 ENCOUNTER — Ambulatory Visit (INDEPENDENT_AMBULATORY_CARE_PROVIDER_SITE_OTHER): Payer: Medicare Other | Admitting: Internal Medicine

## 2014-06-19 VITALS — BP 118/70 | HR 83 | Temp 98.5°F | Wt 294.5 lb

## 2014-06-19 DIAGNOSIS — E042 Nontoxic multinodular goiter: Secondary | ICD-10-CM

## 2014-06-19 DIAGNOSIS — I10 Essential (primary) hypertension: Secondary | ICD-10-CM | POA: Diagnosis not present

## 2014-06-19 DIAGNOSIS — J45909 Unspecified asthma, uncomplicated: Secondary | ICD-10-CM | POA: Diagnosis not present

## 2014-06-19 NOTE — Patient Instructions (Signed)
Please continue all other medications as before, and refills have been done if requested.  Please have the pharmacy call with any other refills you may need.  Please continue your efforts at being more active, low cholesterol diet, and weight control.  Please keep your appointments with your specialists as you may have planned     

## 2014-06-19 NOTE — Progress Notes (Signed)
Subjective:    Patient ID: Dylan Benton, male    DOB: June 30, 1941, 73 y.o.   MRN: [id number]  HPI  Here with wife, both have numerous concerns about results seen on Mychart that they would like further explained, such as mention of mult small thyroid nodules on CT and the meaning of atelectasis.  Denies hyper or hypo thyroid symptoms such as voice, skin or hair change.  Pt denies chest pain, increased sob or doe, wheezing, orthopnea, PND, increased LE swelling, palpitations, dizziness or syncope.  Pt denies new neurological symptoms such as new headache, or facial or extremity weakness or numbness  .  Recent CTA and echo neg for worsening.   Past Medical History  Diagnosis Date  . Anemia, unspecified     NOS ?resolved  . Prostate cancer     Hx of   . Hypertension   . Chest pain   . Aortic root dilation   . Obesity   . H pylori ulcer     teated H pylori  . Impaired glucose tolerance 05/05/2012  . Male stress incontinence 05/05/2012  . Erectile dysfunction 05/05/2012  . PUD (peptic ulcer disease) 05/05/2012  . GERD (gastroesophageal reflux disease) 05/05/2012  . ECHOCARDIOGRAM, ABNORMAL 12/26/2006     Aortic root dilation  This problem surfaced in 2006 after a cardiology referral for chest pain.  He was seen by Dr. Haroldine Laws in January o6 and cathed on Jan. 18.  Coronaries were nl and EF was 65%.  Echo showed mild aortic root dilation of 71mm. He was started on metoprolol (although I notice that he is no longer on this.  Annual echo to follow aortic root was advised.  Aortic root dimension was unchanged on studies in 2009 and 2010.  Will continue to follow this, perhaps not every year as it seems stable.   Marland Kitchen PROSTATE CANCER, HX OF 08/06/2009    Annotation: prostatectomy and radiotherapy in 1990s,  Dr. Jeffie Pollock Qualifier: Diagnosis of  By: Kelton Pillar MD, [place]    . ASTHMA, UNSPECIFIED, UNSPECIFIED STATUS 08/06/2009    Annotation: No exacerbations.  Qualifier: Diagnosis of  By: Kelton Pillar MD, [place]    .  OBESITY 10/06/2006    Qualifier: Diagnosis of  By: Stann Mainland MD, Nicole Kindred    . 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 Edilia Bo Qualifier: Diagnosis of  By: Kelton Pillar MD, [place]    . Bladder calculus 05/05/2012  . Anxiety   . Arthritis   . Hyperlipidemia 05/08/2012  . Lumbar disc disease 05/08/2012  . Allergy   . Cataract   . Myocardial infarction     pt states, "i've been told I have had two heart attacks  . History of back surgery    Past Surgical History  Procedure Laterality Date  . Eye surgury    . Ear surgury    . Prostatectomy    . Back surgery      reports that he quit smoking about 25 years ago. He has never used smokeless tobacco. He reports that he does not drink alcohol or use illicit drugs. family history includes Coronary artery disease in an other family member; Hypertension in an other family member; Stroke in an other family member. There is no history of Colon cancer, Esophageal cancer, Rectal cancer, or Stomach cancer. No Known Allergies Current Outpatient Prescriptions on File Prior to Visit  Medication Sig Dispense Refill  . albuterol (PROVENTIL,VENTOLIN) 90 MCG/ACT inhaler Inhale 2 puffs into the lungs every 6 (  six) hours as needed. Use as directed       . COMBIGAN 0.2-0.5 % ophthalmic solution       . furosemide (LASIX) 40 MG tablet TAKE 1 TABLET BY MOUTH DAILY  90 tablet  3  . PROAIR HFA 108 (90 BASE) MCG/ACT inhaler USE AS DIRECTED  8.5 each  1  . ranitidine (ZANTAC 75) 75 MG tablet Take 1 tablet (75 mg total) by mouth daily as needed for heartburn.  90 tablet  3  . TRAVATAN Z 0.004 % ophthalmic solution       . triamcinolone cream (KENALOG) 0.1 % Apply 1 application topically 2 (two) times daily.  30 g  0   No current facility-administered medications on file prior to visit.   Review of Systems  Constitutional: Negative for unusual diaphoresis or other sweats  HENT: Negative for ringing in ear Eyes: Negative for double  vision or worsening visual disturbance.  Respiratory: Negative for choking and stridor.   Gastrointestinal: Negative for vomiting or other signifcant bowel change Genitourinary: Negative for hematuria or decreased urine volume.  Musculoskeletal: Negative for other MSK pain or swelling Skin: Negative for color change and worsening wound.  Neurological: Negative for tremors and numbness other than noted  Psychiatric/Behavioral: Negative for decreased concentration or agitation other than above       Objective:   Physical Exam BP 118/70  Pulse 83  Temp(Src) 98.5 F (36.9 C) (Oral)  Wt 294 lb 8 oz (133.584 kg)  SpO2 94% VS noted,  Constitutional: Pt appears well-developed, well-nourished.  HENT: Head: NCAT.  Right Ear: External ear normal.  Left Ear: External ear normal.  Eyes: . Pupils are equal, round, and reactive to light. Conjunctivae and EOM are normal Neck: Normal range of motion. Neck supple.  Cardiovascular: Normal rate and regular rhythm.   Pulmonary/Chest: Effort normal and breath sounds normal.  Neurological: Pt is alert. Not confused , motor grossly intact Skin: Skin is warm. No rash Psychiatric: Pt behavior is normal. No agitation.     Assessment & Plan:

## 2014-06-19 NOTE — Progress Notes (Signed)
Pre visit review using our clinic review tool, if applicable. No additional management support is needed unless otherwise documented below in the visit note. 

## 2014-06-20 DIAGNOSIS — E042 Nontoxic multinodular goiter: Secondary | ICD-10-CM | POA: Insufficient documentation

## 2014-06-20 NOTE — Assessment & Plan Note (Signed)
stable overall by history and exam, recent data reviewed with pt, and pt to continue medical treatment as before,  to f/u any worsening symptoms or concerns BP Readings from Last 3 Encounters:  06/19/14 118/70  05/17/14 128/70  05/08/14 122/80

## 2014-06-20 NOTE — Assessment & Plan Note (Signed)
Mult small, asympt,  Lab Results  Component Value Date   TSH 1.52 05/08/2014   Benign appearing on CT, to follow clinically

## 2014-06-20 NOTE — Assessment & Plan Note (Signed)
stable overall by history and exam, recent data reviewed with pt, and pt to continue medical treatment as before,  to f/u any worsening symptoms or concerns, and the bening nature of atelectasis reviewed with pt

## 2014-08-23 DIAGNOSIS — H4011X3 Primary open-angle glaucoma, severe stage: Secondary | ICD-10-CM | POA: Diagnosis not present

## 2014-08-28 DIAGNOSIS — C61 Malignant neoplasm of prostate: Secondary | ICD-10-CM | POA: Diagnosis not present

## 2014-09-04 DIAGNOSIS — C61 Malignant neoplasm of prostate: Secondary | ICD-10-CM | POA: Diagnosis not present

## 2014-09-04 DIAGNOSIS — N5201 Erectile dysfunction due to arterial insufficiency: Secondary | ICD-10-CM | POA: Diagnosis not present

## 2014-09-04 DIAGNOSIS — N393 Stress incontinence (female) (male): Secondary | ICD-10-CM | POA: Diagnosis not present

## 2014-09-17 ENCOUNTER — Emergency Department (HOSPITAL_COMMUNITY): Payer: Medicare Other

## 2014-09-17 ENCOUNTER — Emergency Department (HOSPITAL_COMMUNITY)
Admission: EM | Admit: 2014-09-17 | Discharge: 2014-09-17 | Disposition: A | Discharge status: Home / Self Care | Payer: Medicare Other | Attending: Emergency Medicine | Admitting: Emergency Medicine

## 2014-09-17 ENCOUNTER — Encounter (HOSPITAL_COMMUNITY): Payer: Self-pay | Admitting: Emergency Medicine

## 2014-09-17 DIAGNOSIS — Z8619 Personal history of other infectious and parasitic diseases: Secondary | ICD-10-CM | POA: Diagnosis not present

## 2014-09-17 DIAGNOSIS — Z872 Personal history of diseases of the skin and subcutaneous tissue: Secondary | ICD-10-CM | POA: Insufficient documentation

## 2014-09-17 DIAGNOSIS — Z87442 Personal history of urinary calculi: Secondary | ICD-10-CM | POA: Diagnosis not present

## 2014-09-17 DIAGNOSIS — R6883 Chills (without fever): Secondary | ICD-10-CM | POA: Diagnosis not present

## 2014-09-17 DIAGNOSIS — R918 Other nonspecific abnormal finding of lung field: Secondary | ICD-10-CM | POA: Diagnosis not present

## 2014-09-17 DIAGNOSIS — J45909 Unspecified asthma, uncomplicated: Secondary | ICD-10-CM | POA: Diagnosis not present

## 2014-09-17 DIAGNOSIS — Z79899 Other long term (current) drug therapy: Secondary | ICD-10-CM | POA: Diagnosis not present

## 2014-09-17 DIAGNOSIS — Z8711 Personal history of peptic ulcer disease: Secondary | ICD-10-CM | POA: Insufficient documentation

## 2014-09-17 DIAGNOSIS — I252 Old myocardial infarction: Secondary | ICD-10-CM | POA: Diagnosis not present

## 2014-09-17 DIAGNOSIS — E669 Obesity, unspecified: Secondary | ICD-10-CM | POA: Diagnosis not present

## 2014-09-17 DIAGNOSIS — N3000 Acute cystitis without hematuria: Secondary | ICD-10-CM | POA: Diagnosis not present

## 2014-09-17 DIAGNOSIS — Z8546 Personal history of malignant neoplasm of prostate: Secondary | ICD-10-CM | POA: Insufficient documentation

## 2014-09-17 DIAGNOSIS — M519 Unspecified thoracic, thoracolumbar and lumbosacral intervertebral disc disorder: Secondary | ICD-10-CM | POA: Insufficient documentation

## 2014-09-17 DIAGNOSIS — Z862 Personal history of diseases of the blood and blood-forming organs and certain disorders involving the immune mechanism: Secondary | ICD-10-CM | POA: Insufficient documentation

## 2014-09-17 DIAGNOSIS — Z9889 Other specified postprocedural states: Secondary | ICD-10-CM | POA: Insufficient documentation

## 2014-09-17 DIAGNOSIS — Z87891 Personal history of nicotine dependence: Secondary | ICD-10-CM | POA: Diagnosis not present

## 2014-09-17 DIAGNOSIS — R069 Unspecified abnormalities of breathing: Secondary | ICD-10-CM | POA: Diagnosis not present

## 2014-09-17 DIAGNOSIS — Z8659 Personal history of other mental and behavioral disorders: Secondary | ICD-10-CM | POA: Insufficient documentation

## 2014-09-17 DIAGNOSIS — I10 Essential (primary) hypertension: Secondary | ICD-10-CM | POA: Insufficient documentation

## 2014-09-17 DIAGNOSIS — K219 Gastro-esophageal reflux disease without esophagitis: Secondary | ICD-10-CM | POA: Insufficient documentation

## 2014-09-17 DIAGNOSIS — J449 Chronic obstructive pulmonary disease, unspecified: Secondary | ICD-10-CM | POA: Diagnosis not present

## 2014-09-17 DIAGNOSIS — M1990 Unspecified osteoarthritis, unspecified site: Secondary | ICD-10-CM | POA: Insufficient documentation

## 2014-09-17 DIAGNOSIS — Z8669 Personal history of other diseases of the nervous system and sense organs: Secondary | ICD-10-CM | POA: Insufficient documentation

## 2014-09-17 LAB — BASIC METABOLIC PANEL
ANION GAP: 12 (ref 5–15)
BUN: 10 mg/dL (ref 6–23)
CHLORIDE: 102 meq/L (ref 96–112)
CO2: 25 mEq/L (ref 19–32)
CREATININE: 0.92 mg/dL (ref 0.50–1.35)
Calcium: 9.1 mg/dL (ref 8.4–10.5)
GFR calc non Af Amer: 80 mL/min — ABNORMAL LOW (ref >90)
Glucose, Bld: 123 mg/dL — ABNORMAL HIGH (ref 70–99)
POTASSIUM: 4 meq/L (ref 3.7–5.3)
SODIUM: 139 meq/L (ref 137–147)

## 2014-09-17 LAB — URINALYSIS, ROUTINE W REFLEX MICROSCOPIC
Glucose, UA: NEGATIVE mg/dL
KETONES UR: NEGATIVE mg/dL
NITRITE: POSITIVE — AB
PROTEIN: NEGATIVE mg/dL
Specific Gravity, Urine: 1.023 (ref 1.005–1.030)
Urobilinogen, UA: 1 mg/dL (ref 0.0–1.0)
pH: 5.5 (ref 5.0–8.0)

## 2014-09-17 LAB — CBC WITH DIFFERENTIAL/PLATELET
BASOS ABS: 0 10*3/uL (ref 0.0–0.1)
BASOS PCT: 0 % (ref 0–1)
Eosinophils Absolute: 0 10*3/uL (ref 0.0–0.7)
Eosinophils Relative: 0 % (ref 0–5)
HCT: 40.5 % (ref 39.0–52.0)
Hemoglobin: 13.7 g/dL (ref 13.0–17.0)
LYMPHS PCT: 5 % — AB (ref 12–46)
Lymphs Abs: 0.5 10*3/uL — ABNORMAL LOW (ref 0.7–4.0)
MCH: 31.9 pg (ref 26.0–34.0)
MCHC: 33.8 g/dL (ref 30.0–36.0)
MCV: 94.2 fL (ref 78.0–100.0)
Monocytes Absolute: 0.3 10*3/uL (ref 0.1–1.0)
Monocytes Relative: 3 % (ref 3–12)
NEUTROS PCT: 92 % — AB (ref 43–77)
Neutro Abs: 9.1 10*3/uL — ABNORMAL HIGH (ref 1.7–7.7)
PLATELETS: 137 10*3/uL — AB (ref 150–400)
RBC: 4.3 MIL/uL (ref 4.22–5.81)
RDW: 12.2 % (ref 11.5–15.5)
WBC: 10 10*3/uL (ref 4.0–10.5)

## 2014-09-17 LAB — URINE MICROSCOPIC-ADD ON

## 2014-09-17 MED ORDER — SODIUM CHLORIDE 0.9 % IV SOLN
INTRAVENOUS | Status: DC
  Administered 2014-09-17: 03:00:00 via INTRAVENOUS

## 2014-09-17 MED ORDER — DEXTROSE 5 % IV SOLN
1.0000 g | Freq: Once | INTRAVENOUS | Status: AC
  Administered 2014-09-17: 1 g via INTRAVENOUS
  Filled 2014-09-17: qty 10

## 2014-09-17 MED ORDER — CIPROFLOXACIN HCL 500 MG PO TABS: 500.0000 mg | ORAL_TABLET | Freq: Two times a day (BID) | ORAL | Status: DC

## 2014-09-17 NOTE — ED Notes (Signed)
Patient states that he was in bed and got up to go to the bathroom and began to have chills. He had a episode of shortness of breath and tightness in the chest. He denies pain and states that he feels better since receiving the nebulizer. He also states he has a history asthma. He has a complaint of difficulty emptying his bladder but he is seeing a urologist for that.

## 2014-09-17 NOTE — ED Notes (Signed)
Patient transported to X-ray 

## 2014-09-17 NOTE — ED Notes (Signed)
Bed: SU01 Expected date:  Expected time:  Means of arrival:  Comments: 73yo SOB

## 2014-09-17 NOTE — Discharge Instructions (Signed)

## 2014-09-17 NOTE — ED Notes (Signed)
Writer provided pt with a cup of H2O

## 2014-09-17 NOTE — ED Notes (Signed)
MD at bedside. 

## 2014-09-17 NOTE — ED Notes (Signed)
Patient transported to CT 

## 2014-09-17 NOTE — ED Provider Notes (Signed)
CSN: [id number]     Arrival date & time 09/17/14  0213 History   First MD Initiated Contact with Patient 09/17/14 0230     Chief Complaint  Patient presents with  . Chills     (Consider location/radiation/quality/duration/timing/severity/associated sxs/prior Treatment) HPI This is a 73 year old male with a history of prostate disease. He has had an increased urinary frequency and burning with urination over the past 2 days along with an abnormal smell to his urine. He got up to urinate just prior to arrival this morning. As he was returning to bed he developed a sudden onset of chills and shaking. He states this was the worst case of chills he is ever had. His wife gave him 200 mg of Advil. He subsequently developed shortness of breath and chest tightness that was relieved with an albuterol neb treatment by EMS. He denies frank chest pain. He did have several episodes of vomiting but no diarrhea. He has had a low-grade fever since yesterday and was noted to have a temperature 99.4 degrees on arrival.  Past Medical History  Diagnosis Date  . Anemia, unspecified     NOS ?resolved  . Prostate cancer     Hx of   . Hypertension   . Chest pain   . Aortic root dilation   . Obesity   . H pylori ulcer     teated H pylori  . Impaired glucose tolerance 05/05/2012  . Male stress incontinence 05/05/2012  . Erectile dysfunction 05/05/2012  . PUD (peptic ulcer disease) 05/05/2012  . GERD (gastroesophageal reflux disease) 05/05/2012  . ECHOCARDIOGRAM, ABNORMAL 12/26/2006     Aortic root dilation  This problem surfaced in 2006 after a cardiology referral for chest pain.  He was seen by Dr. Haroldine Laws in January o6 and cathed on Jan. 18.  Coronaries were nl and EF was 65%.  Echo showed mild aortic root dilation of 46mm. He was started on metoprolol (although I notice that he is no longer on this.  Annual echo to follow aortic root was advised.  Aortic root dimension was unchanged on studies in 2009 and 2010.   Will continue to follow this, perhaps not every year as it seems stable.   Marland Kitchen PROSTATE CANCER, HX OF 08/06/2009    Annotation: prostatectomy and radiotherapy in 1990s,  Dr. Jeffie Pollock Qualifier: Diagnosis of  By: Kelton Pillar MD, [place]    . ASTHMA, UNSPECIFIED, UNSPECIFIED STATUS 08/06/2009    Annotation: No exacerbations.  Qualifier: Diagnosis of  By: Kelton Pillar MD, [place]    . OBESITY 10/06/2006    Qualifier: Diagnosis of  By: Stann Mainland MD, Nicole Kindred    . 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 Edilia Bo Qualifier: Diagnosis of  By: Kelton Pillar MD, [place]    . Bladder calculus 05/05/2012  . Anxiety   . Arthritis   . Hyperlipidemia 05/08/2012  . Lumbar disc disease 05/08/2012  . Allergy   . Cataract   . Myocardial infarction     pt states, "i've been told I have had two heart attacks  . History of back surgery    Past Surgical History  Procedure Laterality Date  . Eye surgury    . Ear surgury    . Prostatectomy    . Back surgery     Family History  Problem Relation Age of Onset  . Coronary artery disease    . Hypertension    . Stroke    . Colon cancer Neg Hx   .  Esophageal cancer Neg Hx   . Rectal cancer Neg Hx   . Stomach cancer Neg Hx    History  Substance Use Topics  . Smoking status: Former Smoker    Quit date: 12/02/1988  . Smokeless tobacco: Never Used  . Alcohol Use: No    Review of Systems  All other systems reviewed and are negative.   Allergies  Review of patient's allergies indicates no known allergies.  Home Medications   Prior to Admission medications   Medication Sig Start Date End Date Taking? Authorizing Provider  albuterol (PROVENTIL,VENTOLIN) 90 MCG/ACT inhaler Inhale 2 puffs into the lungs every 6 (six) hours as needed. Use as directed    Yes Historical Provider, MD  COMBIGAN 0.2-0.5 % ophthalmic solution Take 1 drop by mouth every 12 (twelve) hours.  04/21/11  Yes Historical Provider, MD  furosemide (LASIX) 40 MG tablet Take 40  mg by mouth daily.   Yes Historical Provider, MD  ibuprofen (ADVIL,MOTRIN) 200 MG tablet Take 200 mg by mouth every 6 (six) hours as needed for moderate pain.   Yes Historical Provider, MD  Multiple Vitamin (MULTIVITAMIN WITH MINERALS) TABS tablet Take 1 tablet by mouth daily.   Yes Historical Provider, MD  ranitidine (ZANTAC 75) 75 MG tablet Take 1 tablet (75 mg total) by mouth daily as needed for heartburn. 11/06/12  Yes Biagio Borg, MD  sodium chloride (OCEAN) 0.65 % SOLN nasal spray Place 1 spray into both nostrils as needed for congestion.   Yes Historical Provider, MD  TRAVATAN Z 0.004 % ophthalmic solution Place 1 drop into the right eye at bedtime.  04/21/11  Yes Historical Provider, MD  triamcinolone cream (KENALOG) 0.1 % Apply 1 application topically 2 (two) times daily. 05/08/14  Yes Biagio Borg, MD   BP 143/56  Pulse 97  Temp(Src) 99.4 F (37.4 C) (Oral)  Resp 27  SpO2 97%  Physical Exam General: Well-developed, well-nourished male in no acute distress; appearance consistent with age of record HENT: normocephalic; atraumatic Eyes: pupils equal, round and reactive to light; extraocular muscles intact Neck: supple Heart: regular rate and rhythm Lungs: clear to auscultation bilaterally Abdomen: soft; nondistended; nontender; no masses or hepatosplenomegaly; bowel sounds present GU: No CVA tenderness Extremities: No deformity; full range of motion; pulses normal; trace edema of lower legs Neurologic: Awake, alert and oriented; motor function intact in all extremities and symmetric; no facial droop Skin: Warm and dry Psychiatric: Normal mood and affect    ED Course  Procedures (including critical care time)   EKG Interpretation   Date/Time:  Tuesday September 17 2014 02:21:59 EDT Ventricular Rate:  96 PR Interval:  175 QRS Duration: 80 QT Interval:  334 QTC Calculation: 422 R Axis:   32 Text Interpretation:  Sinus rhythm Nonspecific T abnormalities, lateral  leads  Rate is faster Confirmed by Rowe Warman  MD, Jenny Reichmann ([id number]) on 09/17/2014  2:31:26 AM      MDM  Nursing notes and vitals signs, including pulse oximetry, reviewed.  Summary of this visit's results, reviewed by myself:  Labs:  Results for orders placed during the hospital encounter of 09/17/14 (from the past 24 hour(s))  CBC WITH DIFFERENTIAL     Status: Abnormal   Collection Time    09/17/14  2:50 AM      Result Value Ref Range   WBC 10.0  4.0 - 10.5 K/uL   RBC 4.30  4.22 - 5.81 MIL/uL   Hemoglobin 13.7  13.0 - 17.0 g/dL  HCT 40.5  39.0 - 52.0 %   MCV 94.2  78.0 - 100.0 fL   MCH 31.9  26.0 - 34.0 pg   MCHC 33.8  30.0 - 36.0 g/dL   RDW 12.2  11.5 - 15.5 %   Platelets 137 (*) 150 - 400 K/uL   Neutrophils Relative % 92 (*) 43 - 77 %   Neutro Abs 9.1 (*) 1.7 - 7.7 K/uL   Lymphocytes Relative 5 (*) 12 - 46 %   Lymphs Abs 0.5 (*) 0.7 - 4.0 K/uL   Monocytes Relative 3  3 - 12 %   Monocytes Absolute 0.3  0.1 - 1.0 K/uL   Eosinophils Relative 0  0 - 5 %   Eosinophils Absolute 0.0  0.0 - 0.7 K/uL   Basophils Relative 0  0 - 1 %   Basophils Absolute 0.0  0.0 - 0.1 K/uL  BASIC METABOLIC PANEL     Status: Abnormal   Collection Time    09/17/14  2:50 AM      Result Value Ref Range   Sodium 139  137 - 147 mEq/L   Potassium 4.0  3.7 - 5.3 mEq/L   Chloride 102  96 - 112 mEq/L   CO2 25  19 - 32 mEq/L   Glucose, Bld 123 (*) 70 - 99 mg/dL   BUN 10  6 - 23 mg/dL   Creatinine, Ser 0.92  0.50 - 1.35 mg/dL   Calcium 9.1  8.4 - 10.5 mg/dL   GFR calc non Af Amer 80 (*) >90 mL/min   GFR calc Af Amer >90  >90 mL/min   Anion gap 12  5 - 15  URINALYSIS, ROUTINE W REFLEX MICROSCOPIC     Status: Abnormal   Collection Time    09/17/14  4:33 AM      Result Value Ref Range   Color, Urine AMBER (*) YELLOW   APPearance TURBID (*) CLEAR   Specific Gravity, Urine 1.023  1.005 - 1.030   pH 5.5  5.0 - 8.0   Glucose, UA NEGATIVE  NEGATIVE mg/dL   Hgb urine dipstick TRACE (*) NEGATIVE   Bilirubin Urine  SMALL (*) NEGATIVE   Ketones, ur NEGATIVE  NEGATIVE mg/dL   Protein, ur NEGATIVE  NEGATIVE mg/dL   Urobilinogen, UA 1.0  0.0 - 1.0 mg/dL   Nitrite POSITIVE (*) NEGATIVE   Leukocytes, UA LARGE (*) NEGATIVE  URINE MICROSCOPIC-ADD ON     Status: Abnormal   Collection Time    09/17/14  4:33 AM      Result Value Ref Range   Squamous Epithelial / LPF RARE  RARE   WBC, UA TOO NUMEROUS TO COUNT  <3 WBC/hpf   RBC / HPF 0-2  <3 RBC/hpf   Bacteria, UA MANY (*) RARE   Urine-Other MUCOUS PRESENT      Imaging Studies: Dg Chest 2 View (if Patient Has Fever And/or Copd)  09/17/2014   CLINICAL DATA:  Chills, shortness of breath, history of asthma and COPD.  EXAM: CHEST  2 VIEW  COMPARISON:  05/23/2014 CT  FINDINGS: Heart size upper normal. Mediastinal contours otherwise within normal range. Mild lung base opacities. No definite pleural effusion. No pneumothorax. Flowing anterior osteophytes. No acute osseous finding.  IMPRESSION: Mild lung base opacities; atelectasis (favored) versus early infiltrate.   Electronically Signed   By: Carlos Levering M.D.   On: 09/17/2014 02:50   7:13 AM Rocephin 1 g IV given for urinary tract infection. Vital signs are  stable. Patient is awake and alert in no acute distress. He is ready to go home. He states he has taken ciprofloxacin in the past without difficulty.     Wynetta Fines, MD 09/17/14 [phone]

## 2014-09-18 DIAGNOSIS — H2511 Age-related nuclear cataract, right eye: Secondary | ICD-10-CM | POA: Diagnosis not present

## 2014-09-18 DIAGNOSIS — H4011X3 Primary open-angle glaucoma, severe stage: Secondary | ICD-10-CM | POA: Diagnosis not present

## 2014-09-19 LAB — URINE CULTURE

## 2014-09-20 ENCOUNTER — Telehealth (HOSPITAL_COMMUNITY): Payer: Self-pay

## 2014-09-20 NOTE — ED Notes (Signed)
Post ED Visit - Positive Culture Follow-up  Culture report reviewed by antimicrobial stewardship pharmacist: []  Wes Dulaney, Pharm.D., BCPS []  Heide Guile, Pharm.D., BCPS []  Alycia Rossetti, Pharm.D., BCPS []  Goltry, Pharm.D., BCPS, AAHIVP [x]  Legrand Como, Pharm.D., BCPS, AAHIVP []  Carly Sabat, Pharm.D. []  Elenor Quinones, Pharm.D.  Positive urine culture Treated with cipro, organism sensitive to the same and no further patient follow-up is required at this time.  Ileene Musa 09/20/2014, 4:52 PM

## 2014-10-03 DIAGNOSIS — R3912 Poor urinary stream: Secondary | ICD-10-CM | POA: Diagnosis not present

## 2014-10-08 DIAGNOSIS — R3912 Poor urinary stream: Secondary | ICD-10-CM | POA: Diagnosis not present

## 2014-10-08 DIAGNOSIS — C61 Malignant neoplasm of prostate: Secondary | ICD-10-CM | POA: Diagnosis not present

## 2014-11-07 ENCOUNTER — Ambulatory Visit (INDEPENDENT_AMBULATORY_CARE_PROVIDER_SITE_OTHER): Payer: Medicare Other | Admitting: Internal Medicine

## 2014-11-07 ENCOUNTER — Encounter: Payer: Self-pay | Admitting: Internal Medicine

## 2014-11-07 VITALS — BP 140/78 | HR 85 | Temp 98.8°F | Ht 76.0 in | Wt 292.5 lb

## 2014-11-07 DIAGNOSIS — R7302 Impaired glucose tolerance (oral): Secondary | ICD-10-CM | POA: Diagnosis not present

## 2014-11-07 DIAGNOSIS — I10 Essential (primary) hypertension: Secondary | ICD-10-CM | POA: Diagnosis not present

## 2014-11-07 DIAGNOSIS — M519 Unspecified thoracic, thoracolumbar and lumbosacral intervertebral disc disorder: Secondary | ICD-10-CM

## 2014-11-07 DIAGNOSIS — E785 Hyperlipidemia, unspecified: Secondary | ICD-10-CM | POA: Diagnosis not present

## 2014-11-07 DIAGNOSIS — Z23 Encounter for immunization: Secondary | ICD-10-CM | POA: Diagnosis not present

## 2014-11-07 MED ORDER — MELOXICAM 7.5 MG PO TABS: 7.5000 mg | ORAL_TABLET | Freq: Every day | ORAL | Status: DC

## 2014-11-07 NOTE — Assessment & Plan Note (Signed)
With left sided sciatica - for mobic prn,  to f/u any worsening symptoms or concerns, f/u with Dr Callie Fielding

## 2014-11-07 NOTE — Addendum Note (Signed)
Addended by: Sharon Seller B on: 11/07/2014 10:32 AM   Modules accepted: Orders

## 2014-11-07 NOTE — Assessment & Plan Note (Signed)
stable overall by history and exam, recent data reviewed with pt, and pt to continue medical treatment as before,  to f/u any worsening symptoms or concerns BP Readings from Last 3 Encounters:  11/07/14 140/78  09/17/14 138/51  06/19/14 118/70

## 2014-11-07 NOTE — Progress Notes (Signed)
Pre visit review using our clinic review tool, if applicable. No additional management support is needed unless otherwise documented below in the visit note. 

## 2014-11-07 NOTE — Assessment & Plan Note (Signed)
stable overall by history and exam, recent data reviewed with pt, and pt to continue medical treatment as before,  to f/u any worsening symptoms or concerns Lab Results  Component Value Date   HGBA1C 5.9 05/08/2014   Declines f/u a1c today, to work on wt loss, diet

## 2014-11-07 NOTE — Patient Instructions (Addendum)
You had the new Prevnar pneumonia shot today  Please take all new medication as prescribed - the pain medication  Please call if the dose is not enough, as it could be increased  Please continue all other medications as before, and refills have been done if requested.  Please have the pharmacy call with any other refills you may need.  Please continue your efforts at being more active, low cholesterol diet, and weight control.  Please keep your appointments with your specialists as you may have planned  Please return in 6 months, or sooner if needed

## 2014-11-07 NOTE — Progress Notes (Signed)
Subjective:    Patient ID: Dylan Benton, male    DOB: 08/28/1941, 73 y.o.   MRN: [id number]  HPI  Here to f/u; overall doing ok,  Pt denies chest pain, increased sob or doe, wheezing, orthopnea, PND, increased LE swelling, palpitations, dizziness or syncope.  Pt denies polydipsia, polyuria, or low sugar symptoms such as weakness or confusion improved with po intake.  Pt denies new neurological symptoms such as new headache, or facial or extremity weakness or numbness.   Pt states overall good compliance with meds, has been trying to follow lower cholesterol diet, with wt overall stable,  but little exercise however.  S/p recent UTI oct 2015, has been seeing Dr Jeffie Pollock, just following for now, may need repeat cysto per pt, with concern about scarring related to prior prostate surgury, alsready s/p 2 procedures related to slowed urination, but last visit stream was strong. Pt continues to have recurring left LBP without change in severity, bowel or bladder change, fever, wt loss,  worsening LE pain/numbness/weakness, gait change or falls, follow with dr Jones/ortho,but advil tid prn not always working Past Medical History  Diagnosis Date  . Anemia, unspecified     NOS ?resolved  . Prostate cancer     Hx of   . Hypertension   . Chest pain   . Aortic root dilation   . Obesity   . H pylori ulcer     teated H pylori  . Impaired glucose tolerance 05/05/2012  . Male stress incontinence 05/05/2012  . Erectile dysfunction 05/05/2012  . PUD (peptic ulcer disease) 05/05/2012  . GERD (gastroesophageal reflux disease) 05/05/2012  . ECHOCARDIOGRAM, ABNORMAL 12/26/2006     Aortic root dilation  This problem surfaced in 2006 after a cardiology referral for chest pain.  He was seen by Dr. Haroldine Laws in January o6 and cathed on Jan. 18.  Coronaries were nl and EF was 65%.  Echo showed mild aortic root dilation of 73mm. He was started on metoprolol (although I notice that he is no longer on this.  Annual echo to  follow aortic root was advised.  Aortic root dimension was unchanged on studies in 2009 and 2010.  Will continue to follow this, perhaps not every year as it seems stable.   Marland Kitchen PROSTATE CANCER, HX OF 08/06/2009    Annotation: prostatectomy and radiotherapy in 1990s,  Dr. Jeffie Pollock Qualifier: Diagnosis of  By: Kelton Pillar MD, [place]    . ASTHMA, UNSPECIFIED, UNSPECIFIED STATUS 08/06/2009    Annotation: No exacerbations.  Qualifier: Diagnosis of  By: Kelton Pillar MD, [place]    . OBESITY 10/06/2006    Qualifier: Diagnosis of  By: Stann Mainland MD, Nicole Kindred    . 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 Edilia Bo Qualifier: Diagnosis of  By: Kelton Pillar MD, [place]    . Bladder calculus 05/05/2012  . Anxiety   . Arthritis   . Hyperlipidemia 05/08/2012  . Lumbar disc disease 05/08/2012  . Allergy   . Cataract   . Myocardial infarction     pt states, "i've been told I have had two heart attacks  . History of back surgery    Past Surgical History  Procedure Laterality Date  . Eye surgury    . Ear surgury    . Prostatectomy    . Back surgery      reports that he quit smoking about 25 years ago. He has never used smokeless tobacco. He reports that he does not drink alcohol or  use illicit drugs. family history includes Coronary artery disease in an other family member; Hypertension in an other family member; Stroke in an other family member. There is no history of Colon cancer, Esophageal cancer, Rectal cancer, or Stomach cancer. No Known Allergies Current Outpatient Prescriptions on File Prior to Visit  Medication Sig Dispense Refill  . albuterol (PROVENTIL,VENTOLIN) 90 MCG/ACT inhaler Inhale 2 puffs into the lungs every 6 (six) hours as needed. Use as directed     . ciprofloxacin (CIPRO) 500 MG tablet Take 1 tablet (500 mg total) by mouth 2 (two) times daily. One po bid x 7 days 14 tablet 0  . COMBIGAN 0.2-0.5 % ophthalmic solution Take 1 drop by mouth every 12 (twelve) hours.     .  furosemide (LASIX) 40 MG tablet Take 40 mg by mouth daily.    Marland Kitchen ibuprofen (ADVIL,MOTRIN) 200 MG tablet Take 200 mg by mouth every 6 (six) hours as needed for moderate pain.    . Multiple Vitamin (MULTIVITAMIN WITH MINERALS) TABS tablet Take 1 tablet by mouth daily.    . ranitidine (ZANTAC 75) 75 MG tablet Take 1 tablet (75 mg total) by mouth daily as needed for heartburn. 90 tablet 3  . sodium chloride (OCEAN) 0.65 % SOLN nasal spray Place 1 spray into both nostrils as needed for congestion.    . TRAVATAN Z 0.004 % ophthalmic solution Place 1 drop into the right eye at bedtime.     . triamcinolone cream (KENALOG) 0.1 % Apply 1 application topically 2 (two) times daily. 30 g 0   No current facility-administered medications on file prior to visit.   Review of Systems  Constitutional: Negative for unusual diaphoresis or other sweats  HENT: Negative for ringing in ear Eyes: Negative for double vision or worsening visual disturbance.  Respiratory: Negative for choking and stridor.   Gastrointestinal: Negative for vomiting or other signifcant bowel change Genitourinary: Negative for hematuria or decreased urine volume.  Musculoskeletal: Negative for other MSK pain or swelling Skin: Negative for color change and worsening wound.  Neurological: Negative for tremors and numbness other than noted  Psychiatric/Behavioral: Negative for decreased concentration or agitation other than above       Objective:   Physical Exam BP 140/78 mmHg  Pulse 85  Temp(Src) 98.8 F (37.1 C) (Oral)  Ht 6\' 4"  (1.93 m)  Wt 292 lb 8 oz (132.677 kg)  BMI 35.62 kg/m2  SpO2 93% VS noted, not ill appearing Constitutional: Pt appears well-developed, well-nourished.  HENT: Head: NCAT.  Right Ear: External ear normal.  Left Ear: External ear normal.  Eyes: . Pupils are equal, round, and reactive to light. Conjunctivae and EOM are normal Neck: Normal range of motion. Neck supple.  Cardiovascular: Normal rate and  regular rhythm.   Pulmonary/Chest: Effort normal and breath sounds normal.  Abd:  Soft, NT, ND, + BS Neurological: Pt is alert. Not confused , motor grossly intact Skin: Skin is warm. No rash Psychiatric: Pt behavior is normal. No agitation.     Assessment & Plan:

## 2014-11-07 NOTE — Assessment & Plan Note (Signed)
stable overall by history and exam, recent data reviewed with pt, and pt to continue medical treatment as before,  to f/u any worsening symptoms or concerns Lab Results  Component Value Date   LDLCALC 111* 05/08/2014

## 2015-03-06 DIAGNOSIS — C61 Malignant neoplasm of prostate: Secondary | ICD-10-CM | POA: Diagnosis not present

## 2015-03-12 DIAGNOSIS — R3912 Poor urinary stream: Secondary | ICD-10-CM | POA: Diagnosis not present

## 2015-03-12 DIAGNOSIS — C61 Malignant neoplasm of prostate: Secondary | ICD-10-CM | POA: Diagnosis not present

## 2015-03-12 DIAGNOSIS — N5201 Erectile dysfunction due to arterial insufficiency: Secondary | ICD-10-CM | POA: Diagnosis not present

## 2015-03-12 DIAGNOSIS — N393 Stress incontinence (female) (male): Secondary | ICD-10-CM | POA: Diagnosis not present

## 2015-03-31 DIAGNOSIS — H524 Presbyopia: Secondary | ICD-10-CM | POA: Diagnosis not present

## 2015-03-31 DIAGNOSIS — H2511 Age-related nuclear cataract, right eye: Secondary | ICD-10-CM | POA: Diagnosis not present

## 2015-03-31 DIAGNOSIS — H4011X3 Primary open-angle glaucoma, severe stage: Secondary | ICD-10-CM | POA: Diagnosis not present

## 2015-05-12 DIAGNOSIS — H4011X3 Primary open-angle glaucoma, severe stage: Secondary | ICD-10-CM | POA: Diagnosis not present

## 2015-05-12 DIAGNOSIS — H2511 Age-related nuclear cataract, right eye: Secondary | ICD-10-CM | POA: Diagnosis not present

## 2015-05-13 ENCOUNTER — Other Ambulatory Visit (INDEPENDENT_AMBULATORY_CARE_PROVIDER_SITE_OTHER): Payer: Medicare Other

## 2015-05-13 ENCOUNTER — Encounter: Payer: Self-pay | Admitting: Internal Medicine

## 2015-05-13 ENCOUNTER — Ambulatory Visit (INDEPENDENT_AMBULATORY_CARE_PROVIDER_SITE_OTHER): Payer: Medicare Other | Admitting: Internal Medicine

## 2015-05-13 VITALS — BP 126/78 | HR 68 | Temp 97.7°F | Ht 75.0 in | Wt 292.0 lb

## 2015-05-13 DIAGNOSIS — I10 Essential (primary) hypertension: Secondary | ICD-10-CM | POA: Diagnosis not present

## 2015-05-13 DIAGNOSIS — R7302 Impaired glucose tolerance (oral): Secondary | ICD-10-CM | POA: Diagnosis not present

## 2015-05-13 DIAGNOSIS — N320 Bladder-neck obstruction: Secondary | ICD-10-CM

## 2015-05-13 DIAGNOSIS — M5432 Sciatica, left side: Secondary | ICD-10-CM

## 2015-05-13 DIAGNOSIS — C61 Malignant neoplasm of prostate: Secondary | ICD-10-CM

## 2015-05-13 DIAGNOSIS — E785 Hyperlipidemia, unspecified: Secondary | ICD-10-CM

## 2015-05-13 LAB — CBC WITH DIFFERENTIAL/PLATELET
BASOS ABS: 0 10*3/uL (ref 0.0–0.1)
BASOS PCT: 0.5 % (ref 0.0–3.0)
EOS ABS: 0 10*3/uL (ref 0.0–0.7)
Eosinophils Relative: 1 % (ref 0.0–5.0)
HCT: 42.3 % (ref 39.0–52.0)
HEMOGLOBIN: 14 g/dL (ref 13.0–17.0)
LYMPHS PCT: 34.2 % (ref 12.0–46.0)
Lymphs Abs: 1.3 10*3/uL (ref 0.7–4.0)
MCHC: 33 g/dL (ref 30.0–36.0)
MCV: 96.2 fl (ref 78.0–100.0)
MONO ABS: 0.3 10*3/uL (ref 0.1–1.0)
Monocytes Relative: 8.8 % (ref 3.0–12.0)
NEUTROS ABS: 2.1 10*3/uL (ref 1.4–7.7)
NEUTROS PCT: 55.5 % (ref 43.0–77.0)
Platelets: 188 10*3/uL (ref 150.0–400.0)
RBC: 4.39 Mil/uL (ref 4.22–5.81)
RDW: 12.9 % (ref 11.5–15.5)
WBC: 3.8 10*3/uL — ABNORMAL LOW (ref 4.0–10.5)

## 2015-05-13 LAB — LIPID PANEL
Cholesterol: 156 mg/dL (ref 0–200)
HDL: 41.2 mg/dL (ref >39.00)
LDL Cholesterol: 100 mg/dL — ABNORMAL HIGH (ref 0–99)
NONHDL: 114.8
Total CHOL/HDL Ratio: 4
Triglycerides: 73 mg/dL (ref 0.0–149.0)
VLDL: 14.6 mg/dL (ref 0.0–40.0)

## 2015-05-13 LAB — HEPATIC FUNCTION PANEL
ALBUMIN: 4 g/dL (ref 3.5–5.2)
ALT: 14 U/L (ref 0–53)
AST: 19 U/L (ref 0–37)
Alkaline Phosphatase: 78 U/L (ref 39–117)
BILIRUBIN TOTAL: 0.8 mg/dL (ref 0.2–1.2)
Bilirubin, Direct: 0.2 mg/dL (ref 0.0–0.3)
TOTAL PROTEIN: 6.7 g/dL (ref 6.0–8.3)

## 2015-05-13 LAB — URINALYSIS, ROUTINE W REFLEX MICROSCOPIC
BILIRUBIN URINE: NEGATIVE
Hgb urine dipstick: NEGATIVE
Ketones, ur: NEGATIVE
NITRITE: NEGATIVE
PH: 7 (ref 5.0–8.0)
Specific Gravity, Urine: 1.02 (ref 1.000–1.030)
Urine Glucose: NEGATIVE
Urobilinogen, UA: 2 — AB (ref 0.0–1.0)

## 2015-05-13 LAB — BASIC METABOLIC PANEL
BUN: 9 mg/dL (ref 6–23)
CHLORIDE: 103 meq/L (ref 96–112)
CO2: 29 mEq/L (ref 19–32)
CREATININE: 0.91 mg/dL (ref 0.40–1.50)
Calcium: 9.2 mg/dL (ref 8.4–10.5)
GFR: 104.11 mL/min (ref >60.00)
Glucose, Bld: 139 mg/dL — ABNORMAL HIGH (ref 70–99)
Potassium: 4.2 mEq/L (ref 3.5–5.1)
Sodium: 139 mEq/L (ref 135–145)

## 2015-05-13 LAB — HEMOGLOBIN A1C: Hgb A1c MFr Bld: 5.5 % (ref 4.6–6.5)

## 2015-05-13 LAB — PSA: PSA: 1.13 ng/mL (ref 0.10–4.00)

## 2015-05-13 LAB — TSH: TSH: 1.21 u[IU]/mL (ref 0.35–4.50)

## 2015-05-13 NOTE — Progress Notes (Signed)
Subjective:    Patient ID: Dylan Benton, male    DOB: Oct 29, 1941, 74 y.o.   MRN: [id number]  HPI  Here for yuearly f/u;  Overall doing ok;  Pt denies Chest pain, worsening SOB, DOE, wheezing, orthopnea, PND, worsening LE edema, palpitations, dizziness or syncope.  Pt denies neurological change such as new headache, facial or extremity weakness, except for numbness in feet at night that bothers him.  Elavil helps somewhat but the 100 mg makes him somnolent and has been taking sometimes during day (half pill still causes sleepiness but not "zombie" like the 100 mg) , but also has some nubmenss that might be positional, also hx left sciatica, waking seems to help that numbness but sitting sometimes worse, and seems to wake him at 2 am most mornings after several hrs lying down.  .  Pt denies polydipsia, polyuria, or low sugar symptoms. Pt states overall good compliance with treatment and medications, good tolerability, and has been trying to follow appropriate diet.  Pt denies worsening depressive symptoms, suicidal ideation or panic. No fever, night sweats, wt loss, loss of appetite, or other constitutional symptoms.  Pt states good ability with ADL's, has low fall risk, home safety reviewed and adequate, no other significant changes in hearing or vision, and only occasionally active with exercise due to left sciatica but still able wk 4 times per wk on stat bike.  Also recently june3 had some oral surgury - had 8 teeth removed.   Last UTI oct 2015, saw Dr WRenn/urology with f/u planned at 6 mo from last visit about mar 2016 per pt. Past Medical History  Diagnosis Date  . Anemia, unspecified     NOS ?resolved  . Prostate cancer     Hx of   . Hypertension   . Chest pain   . Aortic root dilation   . Obesity   . H pylori ulcer     teated H pylori  . Impaired glucose tolerance 05/05/2012  . Male stress incontinence 05/05/2012  . Erectile dysfunction 05/05/2012  . PUD (peptic ulcer disease) 05/05/2012    . GERD (gastroesophageal reflux disease) 05/05/2012  . ECHOCARDIOGRAM, ABNORMAL 12/26/2006     Aortic root dilation  This problem surfaced in 2006 after a cardiology referral for chest pain.  He was seen by Dr. Haroldine Laws in January o6 and cathed on Jan. 18.  Coronaries were nl and EF was 65%.  Echo showed mild aortic root dilation of 38mm. He was started on metoprolol (although I notice that he is no longer on this.  Annual echo to follow aortic root was advised.  Aortic root dimension was unchanged on studies in 2009 and 2010.  Will continue to follow this, perhaps not every year as it seems stable.   Marland Kitchen PROSTATE CANCER, HX OF 08/06/2009    Annotation: prostatectomy and radiotherapy in 1990s,  Dr. Jeffie Pollock Qualifier: Diagnosis of  By: Kelton Pillar MD, [place]    . ASTHMA, UNSPECIFIED, UNSPECIFIED STATUS 08/06/2009    Annotation: No exacerbations.  Qualifier: Diagnosis of  By: Kelton Pillar MD, [place]    . OBESITY 10/06/2006    Qualifier: Diagnosis of  By: Stann Mainland MD, Nicole Kindred    . 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 Edilia Bo Qualifier: Diagnosis of  By: Kelton Pillar MD, [place]    . Bladder calculus 05/05/2012  . Anxiety   . Arthritis   . Hyperlipidemia 05/08/2012  . Lumbar disc disease 05/08/2012  . Allergy   .  Cataract   . Myocardial infarction     pt states, "i've been told I have had two heart attacks  . History of back surgery    Past Surgical History  Procedure Laterality Date  . Eye surgury    . Ear surgury    . Prostatectomy    . Back surgery      reports that he quit smoking about 26 years ago. He has never used smokeless tobacco. He reports that he does not drink alcohol or use illicit drugs. family history includes Coronary artery disease in an other family member; Hypertension in an other family member; Stroke in an other family member. There is no history of Colon cancer, Esophageal cancer, Rectal cancer, or Stomach cancer. No Known Allergies Current  Outpatient Prescriptions on File Prior to Visit  Medication Sig Dispense Refill  . albuterol (PROVENTIL,VENTOLIN) 90 MCG/ACT inhaler Inhale 2 puffs into the lungs every 6 (six) hours as needed. Use as directed     . COMBIGAN 0.2-0.5 % ophthalmic solution Take 1 drop by mouth every 12 (twelve) hours.     . furosemide (LASIX) 40 MG tablet Take 40 mg by mouth daily.    Marland Kitchen ibuprofen (ADVIL,MOTRIN) 200 MG tablet Take 200 mg by mouth every 6 (six) hours as needed for moderate pain.    . meloxicam (MOBIC) 7.5 MG tablet Take 1 tablet (7.5 mg total) by mouth daily. 90 tablet 1  . Multiple Vitamin (MULTIVITAMIN WITH MINERALS) TABS tablet Take 1 tablet by mouth daily.    . ranitidine (ZANTAC 75) 75 MG tablet Take 1 tablet (75 mg total) by mouth daily as needed for heartburn. 90 tablet 3  . sodium chloride (OCEAN) 0.65 % SOLN nasal spray Place 1 spray into both nostrils as needed for congestion.    . TRAVATAN Z 0.004 % ophthalmic solution Place 1 drop into the right eye at bedtime.     . triamcinolone cream (KENALOG) 0.1 % Apply 1 application topically 2 (two) times daily. 30 g 0  . ciprofloxacin (CIPRO) 500 MG tablet Take 1 tablet (500 mg total) by mouth 2 (two) times daily. One po bid x 7 days (Patient not taking: Reported on 05/13/2015) 14 tablet 0   No current facility-administered medications on file prior to visit.   Review of Systems Constitutional: Negative for increased diaphoresis, other activity, appetite or siginficant weight change other than noted HENT: Negative for worsening hearing loss, ear pain, facial swelling, mouth sores and neck stiffness.   Eyes: Negative for other worsening pain, redness or visual disturbance.  Respiratory: Negative for shortness of breath and wheezing  Cardiovascular: Negative for chest pain and palpitations.  Gastrointestinal: Negative for diarrhea, blood in stool, abdominal distention or other pain Genitourinary: Negative for hematuria, flank pain or change in  urine volume.  Musculoskeletal: Negative for myalgias or other joint complaints.  Skin: Negative for color change and wound or drainage.  Neurological: Negative for syncope and numbness. other than noted Hematological: Negative for adenopathy. or other swelling Psychiatric/Behavioral: Negative for hallucinations, SI, self-injury, decreased concentration or other worsening agitation.      Objective:   Physical Exam BP 126/78 mmHg  Pulse 68  Temp(Src) 97.7 F (36.5 C) (Oral)  Ht 6\' 3"  (1.905 m)  Wt 292 lb (132.45 kg)  BMI 36.50 kg/m2  SpO2 96% VS noted,  Constitutional: Pt is oriented to person, place, and time. Appears well-developed and well-nourished, in no significant distress Head: Normocephalic and atraumatic.  Right Ear: External ear normal.  Left Ear: External ear normal.  Nose: Nose normal.  Mouth/Throat: Oropharynx is clear and moist.  Eyes: Conjunctivae and EOM are normal. Pupils are equal, round, and reactive to light.  Neck: Normal range of motion. Neck supple. No JVD present. No tracheal deviation present or significant neck LA or mass Cardiovascular: Normal rate, regular rhythm, normal heart sounds and intact distal pulses.   Pulmonary/Chest: Effort normal and breath sounds without rales or wheezing  Abdominal: Soft. Bowel sounds are normal. NT. No HSM  Musculoskeletal: Normal range of motion. Exhibits no edema.  Lymphadenopathy:  Has no cervical adenopathy.  Neurological: Pt is alert and oriented to person, place, and time. Pt has normal reflexes. No cranial nerve deficit. Motor grossly intact Skin: Skin is warm and dry. No rash noted.  Psychiatric:  Has normal mood and affect. Behavior is normal.     Assessment & Plan:

## 2015-05-13 NOTE — Assessment & Plan Note (Signed)
stable overall by history and exam, recent data reviewed with pt, and pt to continue medical treatment as before,  to f/u any worsening symptoms or concerns Lab Results  Component Value Date   HGBA1C 5.9 05/08/2014

## 2015-05-13 NOTE — Assessment & Plan Note (Signed)
stable overall by history and exam, recent data reviewed with pt, and pt to continue medical treatment as before,  to f/u any worsening symptoms or concerns Lab Results  Component Value Date   LDLCALC 111* 05/08/2014   For f/u labs

## 2015-05-13 NOTE — Assessment & Plan Note (Signed)
See dr wrenn/urology, next f/u not until sept 2016 (approx), pt asks for f/u psa

## 2015-05-13 NOTE — Patient Instructions (Addendum)
Please take all new medication as prescribed - the gabapentin 100 mg three times daily for pain  Please continue all other medications as before, and refills have been done if requested.  Please have the pharmacy call with any other refills you may need.  Please continue your efforts at being more active, low cholesterol diet, and weight control.  You are otherwise up to date with prevention measures today.  Please keep your appointments with your specialists as you may have planned  Please go to the LAB in the Basement (turn left off the elevator) for the tests to be done today  You will be contacted by phone if any changes need to be made immediately.  Otherwise, you will receive a letter about your results with an explanation, but please check with MyChart first.  Please remember to sign up for MyChart if you have not done so, as this will be important to you in the future with finding out test results, communicating by private email, and scheduling acute appointments online when needed.  Please return in 6 months, or sooner if needed

## 2015-05-13 NOTE — Assessment & Plan Note (Signed)
stable overall by history and exam, recent data reviewed with pt, and pt to continue medical treatment as before,  to f/u any worsening symptoms or concerns BP Readings from Last 3 Encounters:  05/13/15 126/78  11/07/14 140/78  09/17/14 138/51

## 2015-05-13 NOTE — Progress Notes (Signed)
Pre visit review using our clinic review tool, if applicable. No additional management support is needed unless otherwise documented below in the visit note. 

## 2015-05-14 MED ORDER — GABAPENTIN 100 MG PO CAPS: 100.0000 mg | ORAL_CAPSULE | Freq: Three times a day (TID) | ORAL | Status: DC

## 2015-05-14 NOTE — Assessment & Plan Note (Signed)
For sciatic gabapentin 100 tid,  to f/u any worsening symptoms or concerns

## 2015-05-14 NOTE — Addendum Note (Signed)
Addended by: Biagio Borg on: 05/14/2015 12:42 PM   Modules accepted: Orders

## 2015-05-19 ENCOUNTER — Telehealth: Payer: Self-pay | Admitting: Internal Medicine

## 2015-05-19 NOTE — Telephone Encounter (Signed)
LVM for pt to call back.

## 2015-05-19 NOTE — Telephone Encounter (Signed)
Patient has question about his urine result, please advise

## 2015-05-20 NOTE — Telephone Encounter (Signed)
Patient has called back in regards. Patient can be reached at [phone].

## 2015-05-20 NOTE — Telephone Encounter (Signed)
No need for treatment at this time, if no symptoms such as fever, dysuria, hematuria, freq, urgency, flank pain, n/v or fever, chills  Please let me know if this is the case and we would consider tx. thanks

## 2015-05-20 NOTE — Telephone Encounter (Signed)
Pt is requesting PCP to look at the labs for his urine again. He is concerned with the WBC and the many bacteria that are shown as present.   Please advise if the pt should do anything else further.

## 2015-05-21 NOTE — Telephone Encounter (Signed)
Pt advised, denied sxs, and will call if sxs develop

## 2015-06-10 ENCOUNTER — Other Ambulatory Visit: Payer: Self-pay | Admitting: Internal Medicine

## 2015-07-07 ENCOUNTER — Other Ambulatory Visit: Payer: Self-pay | Admitting: Internal Medicine

## 2015-08-07 DIAGNOSIS — H4011X3 Primary open-angle glaucoma, severe stage: Secondary | ICD-10-CM | POA: Diagnosis not present

## 2015-08-29 DIAGNOSIS — H401133 Primary open-angle glaucoma, bilateral, severe stage: Secondary | ICD-10-CM | POA: Diagnosis not present

## 2015-08-30 DIAGNOSIS — H31012 Macula scars of posterior pole (postinflammatory) (post-traumatic), left eye: Secondary | ICD-10-CM | POA: Insufficient documentation

## 2015-10-06 DIAGNOSIS — H401133 Primary open-angle glaucoma, bilateral, severe stage: Secondary | ICD-10-CM | POA: Diagnosis not present

## 2015-11-12 ENCOUNTER — Encounter: Payer: Self-pay | Admitting: Internal Medicine

## 2015-11-12 ENCOUNTER — Other Ambulatory Visit (INDEPENDENT_AMBULATORY_CARE_PROVIDER_SITE_OTHER): Payer: Medicare Other

## 2015-11-12 ENCOUNTER — Ambulatory Visit (INDEPENDENT_AMBULATORY_CARE_PROVIDER_SITE_OTHER): Payer: Medicare Other | Admitting: Internal Medicine

## 2015-11-12 VITALS — BP 128/82 | HR 66 | Temp 97.9°F | Ht 75.0 in | Wt 291.0 lb

## 2015-11-12 DIAGNOSIS — R55 Syncope and collapse: Secondary | ICD-10-CM

## 2015-11-12 DIAGNOSIS — E785 Hyperlipidemia, unspecified: Secondary | ICD-10-CM

## 2015-11-12 DIAGNOSIS — I10 Essential (primary) hypertension: Secondary | ICD-10-CM

## 2015-11-12 DIAGNOSIS — I77810 Thoracic aortic ectasia: Secondary | ICD-10-CM | POA: Diagnosis not present

## 2015-11-12 DIAGNOSIS — R7302 Impaired glucose tolerance (oral): Secondary | ICD-10-CM

## 2015-11-12 LAB — CBC WITH DIFFERENTIAL/PLATELET
BASOS ABS: 0 10*3/uL (ref 0.0–0.1)
Basophils Relative: 0.2 % (ref 0.0–3.0)
EOS ABS: 0 10*3/uL (ref 0.0–0.7)
Eosinophils Relative: 1.1 % (ref 0.0–5.0)
HEMATOCRIT: 44.7 % (ref 39.0–52.0)
HEMOGLOBIN: 14.9 g/dL (ref 13.0–17.0)
LYMPHS PCT: 34.2 % (ref 12.0–46.0)
Lymphs Abs: 1.4 10*3/uL (ref 0.7–4.0)
MCHC: 33.4 g/dL (ref 30.0–36.0)
MCV: 94.9 fl (ref 78.0–100.0)
MONOS PCT: 10.1 % (ref 3.0–12.0)
Monocytes Absolute: 0.4 10*3/uL (ref 0.1–1.0)
Neutro Abs: 2.2 10*3/uL (ref 1.4–7.7)
Neutrophils Relative %: 54.4 % (ref 43.0–77.0)
PLATELETS: 192 10*3/uL (ref 150.0–400.0)
RBC: 4.71 Mil/uL (ref 4.22–5.81)
RDW: 12.2 % (ref 11.5–15.5)
WBC: 4.1 10*3/uL (ref 4.0–10.5)

## 2015-11-12 LAB — LIPID PANEL
CHOL/HDL RATIO: 4
Cholesterol: 171 mg/dL (ref 0–200)
HDL: 42.8 mg/dL (ref >39.00)
LDL Cholesterol: 114 mg/dL — ABNORMAL HIGH (ref 0–99)
NONHDL: 128.16
TRIGLYCERIDES: 70 mg/dL (ref 0.0–149.0)
VLDL: 14 mg/dL (ref 0.0–40.0)

## 2015-11-12 LAB — BASIC METABOLIC PANEL
BUN: 9 mg/dL (ref 6–23)
CALCIUM: 9.5 mg/dL (ref 8.4–10.5)
CHLORIDE: 102 meq/L (ref 96–112)
CO2: 29 meq/L (ref 19–32)
CREATININE: 1.02 mg/dL (ref 0.40–1.50)
GFR: 91.14 mL/min (ref >60.00)
Glucose, Bld: 74 mg/dL (ref 70–99)
Potassium: 4.3 mEq/L (ref 3.5–5.1)
Sodium: 138 mEq/L (ref 135–145)

## 2015-11-12 LAB — HEPATIC FUNCTION PANEL
ALT: 16 U/L (ref 0–53)
AST: 20 U/L (ref 0–37)
Albumin: 4.2 g/dL (ref 3.5–5.2)
Alkaline Phosphatase: 84 U/L (ref 39–117)
Bilirubin, Direct: 0.1 mg/dL (ref 0.0–0.3)
TOTAL PROTEIN: 7.3 g/dL (ref 6.0–8.3)
Total Bilirubin: 0.7 mg/dL (ref 0.2–1.2)

## 2015-11-12 LAB — HEMOGLOBIN A1C: HEMOGLOBIN A1C: 5.6 % (ref 4.6–6.5)

## 2015-11-12 MED ORDER — FUROSEMIDE 20 MG PO TABS: 20.0000 mg | ORAL_TABLET | Freq: Every day | ORAL | Status: DC

## 2015-11-12 NOTE — Assessment & Plan Note (Addendum)
Missed 2016 f/u so far - for referral to Dr Gwenlyn Found, also for echo to reassess  Note:  Total time for pt hx, exam, review of record with pt in the room, determination of diagnoses and plan for further eval and tx is > 40 min, with over 50% spent in coordination and counseling of patient

## 2015-11-12 NOTE — Patient Instructions (Signed)
Please decrease the lasix to 20 mg per day  Please continue all other medications as before, including the mobic and the tylenol for pain  Please have the pharmacy call with any other refills you may need.  Please continue your efforts at being more active, low cholesterol diet, and weight control.  You are otherwise up to date with prevention measures today.  Please keep your appointments with your specialists as you may have planned  You will be contacted regarding the referral for: echocardiogram, and Dr Berry/.cardiology  Please go to the LAB in the Basement (turn left off the elevator) for the tests to be done today  You will be contacted by phone if any changes need to be made immediately.  Otherwise, you will receive a letter about your results with an explanation, but please check with MyChart first.  Please remember to sign up for MyChart if you have not done so, as this will be important to you in the future with finding out test results, communicating by private email, and scheduling acute appointments online when needed.  Please return in 6 months, or sooner if needed

## 2015-11-12 NOTE — Assessment & Plan Note (Signed)
stable overall by history and exam, recent data reviewed with pt, and pt to continue medical treatment as before,  to f/u any worsening symptoms or concerns Lab Results  Component Value Date   LDLCALC 100* 05/13/2015   For lab today

## 2015-11-12 NOTE — Progress Notes (Signed)
Subjective:    Patient ID: Dylan Benton, male    DOB: 06-Jul-1941, 74 y.o.   MRN: [id number]  HPI  Here to f/u; overall doing ok,  Pt denies chest pain, increasing sob or doe, wheezing, orthopnea, PND, increased LE swelling, palpitations, or syncope.  Pt denies new neurological symptoms such as new headache, or facial or extremity weakness or numbness.  Pt denies polydipsia, polyuria, or low sugar episode.   Pt denies new neurological symptoms such as new headache, or facial or extremity weakness or numbness.   Pt states overall good compliance with meds, mostly trying to follow appropriate diet, with wt overall stable,  but little exercise however.  Denies worsening reflux, abd pain, dysphagia, n/v, or bloo.d, but has noticed 6 mo intermittent change in caliber of stools so that some seem long and thin, but o/w neg for change in color or blood, and maybe more frequent at times.  No Distension or bloating.  Metamucil seems seems to help constipation, but wondering if could be some kind of blockage. Did require a enema x 1 episode. No melena. Last colonoscopy oct 2014 with ademona polyp, no fu planned due to age.  mobic prn seems to help arthritic pain, but thinks he may be dizzy sometimes with taking this, as well as taking tylenol sometimes.  Only happens with standing, better with sitting, but lately a bit dizzy even with sitting.  Takes furosemide 40 qd - good compliance. Last echo July 2015 with EF slightly low at 50-55%. Some days does not drink as much fluids recently, as before thought it was important to take 8 glasses water per day.  States was supposed to have echo yearly but never got a call, and was not sure if needed to see Dr Gwenlyn Found in f/u.  Has hx of syncope 4 yrs ago Past Medical History  Diagnosis Date  . Anemia, unspecified     NOS ?resolved  . Prostate cancer ([place])     Hx of   . Hypertension   . Chest pain   . Aortic root dilation (HCC)   . Obesity   . H pylori ulcer     teated  H pylori  . Impaired glucose tolerance 05/05/2012  . Male stress incontinence 05/05/2012  . Erectile dysfunction 05/05/2012  . PUD (peptic ulcer disease) 05/05/2012  . GERD (gastroesophageal reflux disease) 05/05/2012  . ECHOCARDIOGRAM, ABNORMAL 12/26/2006     Aortic root dilation  This problem surfaced in 2006 after a cardiology referral for chest pain.  He was seen by Dr. Haroldine Laws in January o6 and cathed on Jan. 18.  Coronaries were nl and EF was 65%.  Echo showed mild aortic root dilation of 40mm. He was started on metoprolol (although I notice that he is no longer on this.  Annual echo to follow aortic root was advised.  Aortic root dimension was unchanged on studies in 2009 and 2010.  Will continue to follow this, perhaps not every year as it seems stable.   Marland Kitchen PROSTATE CANCER, HX OF 08/06/2009    Annotation: prostatectomy and radiotherapy in 1990s,  Dr. Jeffie Pollock Qualifier: Diagnosis of  By: Kelton Pillar MD, [place]    . ASTHMA, UNSPECIFIED, UNSPECIFIED STATUS 08/06/2009    Annotation: No exacerbations.  Qualifier: Diagnosis of  By: Kelton Pillar MD, [place]    . OBESITY 10/06/2006    Qualifier: Diagnosis of  By: Stann Mainland MD, Nicole Kindred    . 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  Bond Qualifier: Diagnosis of  By: Kelton Pillar MD, [place]    . Bladder calculus 05/05/2012  . Anxiety   . Arthritis   . Hyperlipidemia 05/08/2012  . Lumbar disc disease 05/08/2012  . Allergy   . Cataract   . Myocardial infarction [facility])     pt states, "i've been told I have had two heart attacks  . History of back surgery    Past Surgical History  Procedure Laterality Date  . Eye surgury    . Ear surgury    . Prostatectomy    . Back surgery      reports that he quit smoking about 26 years ago. He has never used smokeless tobacco. He reports that he does not drink alcohol or use illicit drugs. family history is negative for Colon cancer, Esophageal cancer, Rectal cancer, and Stomach cancer. No  Known Allergies Current Outpatient Prescriptions on File Prior to Visit  Medication Sig Dispense Refill  . COMBIGAN 0.2-0.5 % ophthalmic solution Take 1 drop by mouth every 12 (twelve) hours.     . furosemide (LASIX) 40 MG tablet Take 40 mg by mouth daily.    Marland Kitchen gabapentin (NEURONTIN) 100 MG capsule Take 1 capsule (100 mg total) by mouth 3 (three) times daily. 90 capsule 5  . ibuprofen (ADVIL,MOTRIN) 200 MG tablet Take 200 mg by mouth every 6 (six) hours as needed for moderate pain.    . meloxicam (MOBIC) 7.5 MG tablet Take 1 tablet (7.5 mg total) by mouth daily. 90 tablet 1  . Multiple Vitamin (MULTIVITAMIN WITH MINERALS) TABS tablet Take 1 tablet by mouth daily.    Marland Kitchen PROAIR HFA 108 (90 BASE) MCG/ACT inhaler USE AS DIRECTED 8.5 Inhaler 5  . ranitidine (ZANTAC 75) 75 MG tablet Take 1 tablet (75 mg total) by mouth daily as needed for heartburn. 90 tablet 3  . sodium chloride (OCEAN) 0.65 % SOLN nasal spray Place 1 spray into both nostrils as needed for congestion.    . TRAVATAN Z 0.004 % ophthalmic solution Place 1 drop into the right eye at bedtime.     . triamcinolone cream (KENALOG) 0.1 % Apply 1 application topically 2 (two) times daily. 30 g 0   No current facility-administered medications on file prior to visit.   Review of Systems  Constitutional: Negative for unusual diaphoresis or night sweats HENT: Negative for ringing in ear or discharge Eyes: Negative for double vision or worsening visual disturbance.  Respiratory: Negative for choking and stridor.   Gastrointestinal: Negative for vomiting or other signifcant bowel change Genitourinary: Negative for hematuria or change in urine volume.  Musculoskeletal: Negative for other MSK pain or swelling Skin: Negative for color change and worsening wound.  Neurological: Negative for tremors and numbness other than noted  Psychiatric/Behavioral: Negative for decreased concentration or agitation other than above       Objective:    Physical Exam BP 128/82 mmHg  Pulse 66  Temp(Src) 97.9 F (36.6 C) (Oral)  Ht 6\' 3"  (1.905 m)  Wt 291 lb (131.997 kg)  BMI 36.37 kg/m2  SpO2 96% VS noted, non toxic Constitutional: Pt appears in no significant distress HENT: Head: NCAT.  Right Ear: External ear normal.  Left Ear: External ear normal.  Eyes: . Pupils are equal, round, and reactive to light. Conjunctivae and EOM are normal Neck: Normal range of motion. Neck supple.  Cardiovascular: Normal rate and regular rhythm.   Pulmonary/Chest: Effort normal and breath sounds without rales or wheezing.  Abd:  Soft, NT,  ND, + BS Neurological: Pt is alert. Not confused , motor grossly intact Skin: Skin is warm. No rash, no LE edema Psychiatric: Pt behavior is normal. No agitation.     Assessment & Plan:

## 2015-11-12 NOTE — Assessment & Plan Note (Signed)
stable overall by history and exam, recent data reviewed with pt, and pt to continue medical treatment as before, but also with mild orthostatic symptoms with reduced routine po fluid intake recent and ongoing lasix 40 qd - to redduce lasix 20 qd

## 2015-11-12 NOTE — Progress Notes (Signed)
Pre visit review using our clinic review tool, if applicable. No additional management support is needed unless otherwise documented below in the visit note. 

## 2015-11-12 NOTE — Assessment & Plan Note (Signed)
stable overall by history and exam, recent data reviewed with pt, and pt to continue medical treatment as before,  to f/u any worsening symptoms or concerns Lab Results  Component Value Date   HGBA1C 5.5 05/13/2015

## 2015-11-26 ENCOUNTER — Encounter: Payer: Self-pay | Admitting: Cardiovascular Disease

## 2015-11-26 ENCOUNTER — Ambulatory Visit (INDEPENDENT_AMBULATORY_CARE_PROVIDER_SITE_OTHER): Payer: Medicare Other | Admitting: Cardiovascular Disease

## 2015-11-26 VITALS — BP 168/76 | HR 77 | Ht 76.0 in | Wt 298.0 lb

## 2015-11-26 DIAGNOSIS — I77810 Thoracic aortic ectasia: Secondary | ICD-10-CM

## 2015-11-26 DIAGNOSIS — E785 Hyperlipidemia, unspecified: Secondary | ICD-10-CM

## 2015-11-26 DIAGNOSIS — I10 Essential (primary) hypertension: Secondary | ICD-10-CM | POA: Diagnosis not present

## 2015-11-26 NOTE — Progress Notes (Signed)
11/26/2015 [name]   Apr 28, 1941  [id number]  Primary Physician Cathlean Cower, MD Primary Cardiologist: Lorretta Harp MD Renae Gloss   HPI:  Mr. Bubier is a 75 year old moderately overweight married African American male father of 2, grandfather and 4 grandchildren referred by Dr. Cathlean Cower, his PCP, for cardiovascular evaluation because of a known dilated thoracic aortic root. I last saw him in the office 05/18/15. His cardiovascular risk factors are notable for 25 pack years of tobacco abuse having quit back in 1990. He has hypertension on diuretic and apparently hyperlipidemia not treated. He has never had a heart attack or stroke. He has had a cart cardiac catheterization in 2006 because of chest pain revealed normal coronary arteries and normal left ventricular function. At that time he was noted to have a dilated thoracic aorta which is the measure by 2-D echo and an CT angiography in the past but not recently. His only symptoms are dyspnea on exertion which probably is related to his remote tobacco abuse. The most recent assessment of his aortic root dilatation was 05/23/14 at which time it measured 3.4 cm.   Current Outpatient Prescriptions  Medication Sig Dispense Refill  . COMBIGAN 0.2-0.5 % ophthalmic solution Take 1 drop by mouth every 12 (twelve) hours.     . furosemide (LASIX) 20 MG tablet Take 1 tablet (20 mg total) by mouth daily. 90 tablet 3  . gabapentin (NEURONTIN) 100 MG capsule Take 1 capsule (100 mg total) by mouth 3 (three) times daily. 90 capsule 5  . ibuprofen (ADVIL,MOTRIN) 200 MG tablet Take 200 mg by mouth every 6 (six) hours as needed for moderate pain.    . meloxicam (MOBIC) 7.5 MG tablet Take 1 tablet (7.5 mg total) by mouth daily. 90 tablet 1  . Multiple Vitamin (MULTIVITAMIN WITH MINERALS) TABS tablet Take 1 tablet by mouth daily.    Marland Kitchen PROAIR HFA 108 (90 BASE) MCG/ACT inhaler USE AS DIRECTED 8.5 Inhaler 5  . ranitidine (ZANTAC 75) 75 MG tablet  Take 1 tablet (75 mg total) by mouth daily as needed for heartburn. 90 tablet 3  . sodium chloride (OCEAN) 0.65 % SOLN nasal spray Place 1 spray into both nostrils as needed for congestion.    . TRAVATAN Z 0.004 % ophthalmic solution Place 1 drop into the right eye at bedtime.     . triamcinolone cream (KENALOG) 0.1 % Apply 1 application topically 2 (two) times daily. 30 g 0   No current facility-administered medications for this visit.    No Known Allergies  Social History   Social History  . Marital Status: Married    Spouse Name: N/A  . Number of Children: 2  . Years of Education: 14   Occupational History  . Camera operator (Part-time)    Social History Main Topics  . Smoking status: Former Smoker    Quit date: 12/02/1988  . Smokeless tobacco: Never Used  . Alcohol Use: No  . Drug Use: No  . Sexual Activity: Not on file   Other Topics Concern  . Not on file   Social History Narrative   Works for [employer] at [employer] in [place].     Review of Systems: General: negative for chills, fever, night sweats or weight changes.  Cardiovascular: negative for chest pain, dyspnea on exertion, edema, orthopnea, palpitations, paroxysmal nocturnal dyspnea or shortness of breath Dermatological: negative for rash Respiratory: negative for cough or wheezing Urologic: negative for hematuria Abdominal: negative for nausea, vomiting,  diarrhea, bright red blood per rectum, melena, or hematemesis Neurologic: negative for visual changes, syncope, or dizziness All other systems reviewed and are otherwise negative except as noted above.    Blood pressure 168/76, pulse 77, height 6\' 4"  (1.93 m), weight 298 lb (135.172 kg).  General appearance: alert and no distress Neck: no adenopathy, no carotid bruit, no JVD, supple, symmetrical, trachea midline and thyroid not enlarged, symmetric, no tenderness/mass/nodules Lungs: clear to auscultation bilaterally Heart: regular rate and rhythm, S1, S2  normal, no murmur, click, rub or gallop Extremities: extremities normal, atraumatic, no cyanosis or edema  EKG sinus rhythm at 77 without ST or T-wave changes. I personally reviewed this EKG  ASSESSMENT AND PLAN:   Hyperlipidemia History of hyperlipidemia currently not on statin therapy with recent lipid profile performed 11/12/15 revealing total cholesterol 171, LDL 114 and HDL 42  Essential hypertension History of hypertension blood pressure measured at 168/76. Usually he says his blood pressure is much lower than this. He is not on antihypertensive medication.  Aortic root dilation (HCC) History of aortic root dilatation with his last assessment being by CT angiogram 05/23/14 measuring 3.4 cm. We will recheck a CT angina      Lorretta Harp MD [facility], [facility] 11/26/2015 2:37 PM

## 2015-11-26 NOTE — Assessment & Plan Note (Signed)
History of aortic root dilatation with his last assessment being by CT angiogram 05/23/14 measuring 3.4 cm. We will recheck a CT angina

## 2015-11-26 NOTE — Assessment & Plan Note (Signed)
History of hyperlipidemia currently not on statin therapy with recent lipid profile performed 11/12/15 revealing total cholesterol 171, LDL 114 and HDL 42

## 2015-11-26 NOTE — Assessment & Plan Note (Signed)
History of hypertension blood pressure measured at 168/76. Usually he says his blood pressure is much lower than this. He is not on antihypertensive medication.

## 2015-11-26 NOTE — Patient Instructions (Signed)
Medication Instructions:  Your physician recommends that you continue on your current medications as directed. Please refer to the Current Medication list given to you today.   Labwork: none  Testing/Procedures: Non-Cardiac CT Angiography (CTA) of Chest, is a special type of CT scan that uses a computer to produce multi-dimensional views of major blood vessels throughout the body. In CT angiography, a contrast material is injected through an IV to help visualize the blood vessels   Follow-Up: Follow up with Dr. Gwenlyn Found as needed.   Any Other Special Instructions Will Be Listed Below (If Applicable).     If you need a refill on your cardiac medications before your next appointment, please call your pharmacy.

## 2015-11-27 ENCOUNTER — Ambulatory Visit (INDEPENDENT_AMBULATORY_CARE_PROVIDER_SITE_OTHER)
Admission: RE | Admit: 2015-11-27 | Discharge: 2015-11-27 | Disposition: A | Discharge status: Home / Self Care | Payer: Medicare Other | Source: Ambulatory Visit | Attending: Cardiovascular Disease | Admitting: Cardiovascular Disease

## 2015-11-27 DIAGNOSIS — I77810 Thoracic aortic ectasia: Secondary | ICD-10-CM | POA: Diagnosis not present

## 2015-11-27 MED ORDER — IOHEXOL 350 MG/ML SOLN
100.0000 mL | Freq: Once | INTRAVENOUS | Status: AC | PRN
  Administered 2015-11-27: 100 mL via INTRAVENOUS

## 2015-12-08 ENCOUNTER — Other Ambulatory Visit: Payer: Self-pay

## 2015-12-08 ENCOUNTER — Ambulatory Visit (HOSPITAL_COMMUNITY): Payer: Medicare Other | Attending: Cardiovascular Disease

## 2015-12-08 DIAGNOSIS — I77810 Thoracic aortic ectasia: Secondary | ICD-10-CM

## 2015-12-08 DIAGNOSIS — Z8249 Family history of ischemic heart disease and other diseases of the circulatory system: Secondary | ICD-10-CM | POA: Diagnosis not present

## 2015-12-08 DIAGNOSIS — R55 Syncope and collapse: Secondary | ICD-10-CM | POA: Insufficient documentation

## 2015-12-08 DIAGNOSIS — I517 Cardiomegaly: Secondary | ICD-10-CM | POA: Diagnosis not present

## 2015-12-08 DIAGNOSIS — Z87891 Personal history of nicotine dependence: Secondary | ICD-10-CM | POA: Insufficient documentation

## 2015-12-08 DIAGNOSIS — E785 Hyperlipidemia, unspecified: Secondary | ICD-10-CM | POA: Insufficient documentation

## 2015-12-08 DIAGNOSIS — I10 Essential (primary) hypertension: Secondary | ICD-10-CM | POA: Diagnosis not present

## 2016-02-09 DIAGNOSIS — H401133 Primary open-angle glaucoma, bilateral, severe stage: Secondary | ICD-10-CM | POA: Diagnosis not present

## 2016-02-27 ENCOUNTER — Ambulatory Visit (HOSPITAL_COMMUNITY)
Admission: EM | Admit: 2016-02-27 | Discharge: 2016-02-27 | Disposition: A | Discharge status: Home / Self Care | Payer: Medicare Other | Attending: Emergency Medicine | Admitting: Emergency Medicine

## 2016-02-27 ENCOUNTER — Telehealth: Payer: Self-pay | Admitting: Internal Medicine

## 2016-02-27 ENCOUNTER — Encounter (HOSPITAL_COMMUNITY): Payer: Self-pay | Admitting: Emergency Medicine

## 2016-02-27 DIAGNOSIS — R609 Edema, unspecified: Secondary | ICD-10-CM

## 2016-02-27 MED ORDER — FUROSEMIDE 40 MG PO TABS: 40.0000 mg | ORAL_TABLET | Freq: Every day | ORAL | Status: DC

## 2016-02-27 NOTE — ED Notes (Signed)
C/o bilateral foot swelling onset 3-4 days... Hx of CHF A&O x4... No acute distress.... Wife at bedside

## 2016-02-27 NOTE — ED Notes (Signed)
Pt d/c by frank p, pa

## 2016-02-27 NOTE — Telephone Encounter (Signed)
Patient Name: Dylan Benton  DOB: [birth year]    Initial Comment Caller States his feet and ankles are swollen   Nurse Assessment  Nurse: Verlin Fester, RN, [facility] Date/Time (Eastern Time): 02/27/2016 8:58:11 AM  Confirm and document reason for call. If symptomatic, describe symptoms. You must click the next button to save text entered. ---Patient states he has swelling in his feet and ankles and he is having more trouble with shortness of breath when he is walking.  Has the patient traveled out of the country within the last 30 days? ---No  Does the patient have any new or worsening symptoms? ---Yes  Will a triage be completed? ---Yes  Related visit to physician within the last 2 weeks? ---No  Does the PT have any chronic conditions? (i.e. diabetes, asthma, etc.) ---Yes  List chronic conditions. ---"CHF,  Is this a behavioral health or substance abuse call? ---No     Guidelines    Guideline Title Affirmed Question Affirmed Notes  Leg Swelling and Edema [1] Difficulty breathing with exertion (e.g., walking) AND [2] new onset or worsening    Final Disposition User   Go to ED Now (or PCP triage) Verlin Fester, RN, [facility]    Referrals  GO TO FACILITY OTHER - SPECIFY   Disagree/Comply: Comply

## 2016-02-27 NOTE — ED Provider Notes (Signed)
CSN: [id number]     Arrival date & time 02/27/16  1304 History   First MD Initiated Contact with Patient 02/27/16 1345     Chief Complaint  Patient presents with  . Leg Swelling   (Consider location/radiation/quality/duration/timing/severity/associated sxs/prior Treatment) HPI Pt states that this morning his wife noticed that his feet were swollen right worse than left. He does not recall how long his feet have been swollen. States his doctor decreased his lasix to 20 mg a day a month or so ago.  Sleeps on 1 pillow, no cp or sob.  Had echo and ct of chest to evaluate heart and aortic root dilation and states he was told it is all stable.  Past Medical History  Diagnosis Date  . Anemia, unspecified     NOS ?resolved  . Prostate cancer ([place])     Hx of   . Hypertension   . Chest pain   . Aortic root dilation (HCC)   . Obesity   . H pylori ulcer     teated H pylori  . Impaired glucose tolerance 05/05/2012  . Male stress incontinence 05/05/2012  . Erectile dysfunction 05/05/2012  . PUD (peptic ulcer disease) 05/05/2012  . GERD (gastroesophageal reflux disease) 05/05/2012  . ECHOCARDIOGRAM, ABNORMAL 12/26/2006     Aortic root dilation  This problem surfaced in 2006 after a cardiology referral for chest pain.  He was seen by Dr. Haroldine Laws in January o6 and cathed on Jan. 18.  Coronaries were nl and EF was 65%.  Echo showed mild aortic root dilation of 16mm. He was started on metoprolol (although I notice that he is no longer on this.  Annual echo to follow aortic root was advised.  Aortic root dimension was unchanged on studies in 2009 and 2010.  Will continue to follow this, perhaps not every year as it seems stable.   Marland Kitchen PROSTATE CANCER, HX OF 08/06/2009    Annotation: prostatectomy and radiotherapy in 1990s,  Dr. Jeffie Pollock Qualifier: Diagnosis of  By: Kelton Pillar MD, [place]    . ASTHMA, UNSPECIFIED, UNSPECIFIED STATUS 08/06/2009    Annotation: No exacerbations.  Qualifier: Diagnosis of  By: Kelton Pillar MD,  [place]    . OBESITY 10/06/2006    Qualifier: Diagnosis of  By: Stann Mainland MD, Nicole Kindred    . 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 Edilia Bo Qualifier: Diagnosis of  By: Kelton Pillar MD, [place]    . Bladder calculus 05/05/2012  . Anxiety   . Arthritis   . Hyperlipidemia 05/08/2012  . Lumbar disc disease 05/08/2012  . Allergy   . Cataract   . Myocardial infarction [facility])     pt states, "i've been told I have had two heart attacks  . History of back surgery   . Thoracic aortic aneurysm [facility])    Past Surgical History  Procedure Laterality Date  . Eye surgury    . Ear surgury    . Prostatectomy    . Back surgery     Family History  Problem Relation Age of Onset  . Coronary artery disease    . Hypertension    . Stroke    . Colon cancer Neg Hx   . Esophageal cancer Neg Hx   . Rectal cancer Neg Hx   . Stomach cancer Neg Hx    Social History  Substance Use Topics  . Smoking status: Former Smoker    Quit date: 12/02/1988  . Smokeless tobacco: Never Used  . Alcohol Use: No  Review of Systems Swollen feet Allergies  Review of patient's allergies indicates no known allergies.  Home Medications   Prior to Admission medications   Medication Sig Start Date End Date Taking? Authorizing Provider  COMBIGAN 0.2-0.5 % ophthalmic solution Take 1 drop by mouth every 12 (twelve) hours.  04/21/11  Yes Historical Provider, MD  furosemide (LASIX) 20 MG tablet Take 1 tablet (20 mg total) by mouth daily. 11/12/15  Yes Biagio Borg, MD  meloxicam (MOBIC) 7.5 MG tablet Take 1 tablet (7.5 mg total) by mouth daily. 11/07/14  Yes Biagio Borg, MD  TRAVATAN Z 0.004 % ophthalmic solution Place 1 drop into the right eye at bedtime.  04/21/11  Yes Historical Provider, MD  furosemide (LASIX) 40 MG tablet Take 1 tablet (40 mg total) by mouth daily. 02/27/16   Konrad Felix, PA  gabapentin (NEURONTIN) 100 MG capsule Take 1 capsule (100 mg total) by mouth 3 (three) times  daily. 05/14/15   Biagio Borg, MD  ibuprofen (ADVIL,MOTRIN) 200 MG tablet Take 200 mg by mouth every 6 (six) hours as needed for moderate pain.    Historical Provider, MD  Multiple Vitamin (MULTIVITAMIN WITH MINERALS) TABS tablet Take 1 tablet by mouth daily.    Historical Provider, MD  PROAIR HFA 108 [phone] BASE) MCG/ACT inhaler USE AS DIRECTED 07/08/15   Biagio Borg, MD  ranitidine (ZANTAC 75) 75 MG tablet Take 1 tablet (75 mg total) by mouth daily as needed for heartburn. 11/06/12   Biagio Borg, MD  sodium chloride (OCEAN) 0.65 % SOLN nasal spray Place 1 spray into both nostrils as needed for congestion.    Historical Provider, MD  triamcinolone cream (KENALOG) 0.1 % Apply 1 application topically 2 (two) times daily. 05/08/14   Biagio Borg, MD   Meds Ordered and Administered this Visit  Medications - No data to display  BP 157/96 mmHg  Pulse 69  Temp(Src) 97.8 F (36.6 C)  SpO2 96% No data found.   Physical Exam  NURSES NOTES AND VITAL SIGNS REVIEWED. CONSTITUTIONAL: Well developed, well nourished, no acute distress HEENT: normocephalic, atraumatic, right and left TM's are normal EYES: Conjunctiva normal NECK:normal ROM, supple, no adenopathy PULMONARY:No respiratory distress, normal effort, Lungs: CTAb/l, no wheezes, or increased work of breathing CARDIOVASCULAR: RRR, no murmur, 1-2 + pitting edema both lower legs.  ABDOMEN: soft, ND, NT, +'ve BS MUSCULOSKELETAL: Normal ROM of all extremities,  SKIN: warm and dry without rash PSYCHIATRIC: Mood and affect, behavior are normal   ED Course  Procedures (including critical care time)  Labs Review Labs Reviewed - No data to display  Imaging Review No results found.   Visual Acuity Review  Right Eye Distance:   Left Eye Distance:   Bilateral Distance:    Right Eye Near:   Left Eye Near:    Bilateral Near:     RX furosemide 40 mg for 7 days Follow up with Dr. Jenny Reichmann.    MDM   1. Dependent edema     Patient is  reassured that there are no issues that require transfer to higher level of care at this time or additional tests. Patient is advised to continue home symptomatic treatment. Patient is advised that if there are new or worsening symptoms to attend the emergency department, contact primary care provider, or return to UC. Instructions of care provided discharged home in stable condition.    THIS NOTE WAS GENERATED USING A VOICE RECOGNITION SOFTWARE PROGRAM. ALL REASONABLE EFFORTS  WERE MADE TO PROOFREAD THIS DOCUMENT FOR ACCURACY.  I have verbally reviewed the discharge instructions with the patient. A printed AVS was given to the patient.  All questions were answered prior to discharge.      Konrad Felix, PA 02/27/16 [phone]

## 2016-02-27 NOTE — Discharge Instructions (Signed)
Peripheral Edema You have swelling in your legs (peripheral edema). This swelling is due to excess accumulation of salt and water in your body. Edema may be a sign of heart, kidney or liver disease, or a side effect of a medication. It may also be due to problems in the leg veins. Elevating your legs and using special support stockings may be very helpful, if the cause of the swelling is due to poor venous circulation. Avoid long periods of standing, whatever the cause. Treatment of edema depends on identifying the cause. Chips, pretzels, pickles and other salty foods should be avoided. Restricting salt in your diet is almost always needed. Water pills (diuretics) are often used to remove the excess salt and water from your body via urine. These medicines prevent the kidney from reabsorbing sodium. This increases urine flow. Diuretic treatment may also result in lowering of potassium levels in your body. Potassium supplements may be needed if you have to use diuretics daily. Daily weights can help you keep track of your progress in clearing your edema. You should call your caregiver for follow up care as recommended. SEEK IMMEDIATE MEDICAL CARE IF:   You have increased swelling, pain, redness, or heat in your legs.  You develop shortness of breath, especially when lying down.  You develop chest or abdominal pain, weakness, or fainting.  You have a fever.   This information is not intended to replace advice given to you by your health care provider. Make sure you discuss any questions you have with your health care provider.   Document Released: 12/16/2004 Document Revised: 01/31/2012 Document Reviewed: 05/21/2015 Elsevier Interactive Patient Education 2016 [place]. Heart Failure Heart failure means your heart has trouble pumping blood. This makes it hard for your body to work well. Heart failure is usually a long-term (chronic) condition. You must take good care of yourself and follow your  doctor's treatment plan. HOME CARE  Take your heart medicine as told by your doctor.  Do not stop taking medicine unless your doctor tells you to.  Do not skip any dose of medicine.  Refill your medicines before they run out.  Take other medicines only as told by your doctor or pharmacist.  Stay active if told by your doctor. The elderly and people with severe heart failure should talk with a doctor about physical activity.  Eat heart-healthy foods. Choose foods that are without trans fat and are low in saturated fat, cholesterol, and salt (sodium). This includes fresh or frozen fruits and vegetables, fish, lean meats, fat-free or low-fat dairy foods, whole grains, and high-fiber foods. Lentils and dried peas and beans (legumes) are also good choices.  Limit salt if told by your doctor.  Cook in a healthy way. Roast, grill, broil, bake, poach, steam, or stir-fry foods.  Limit fluids as told by your doctor.  Weigh yourself every morning. Do this after you pee (urinate) and before you eat breakfast. Write down your weight to give to your doctor.  Take your blood pressure and write it down if your doctor tells you to.  Ask your doctor how to check your pulse. Check your pulse as told.  Lose weight if told by your doctor.  Stop smoking or chewing tobacco. Do not use gum or patches that help you quit without your doctor's approval.  Schedule and go to doctor visits as told.  Nonpregnant women should have no more than 1 drink a day. Men should have no more than 2 drinks a day.  Talk to your doctor about drinking alcohol.  Stop illegal drug use.  Stay current with shots (immunizations).  Manage your health conditions as told by your doctor.  Learn to manage your stress.  Rest when you are tired.  If it is really hot outside:  Avoid intense activities.  Use air conditioning or fans, or get in a cooler place.  Avoid caffeine and alcohol.  Wear loose-fitting, lightweight,  and light-colored clothing.  If it is really cold outside:  Avoid intense activities.  Layer your clothing.  Wear mittens or gloves, a hat, and a scarf when going outside.  Avoid alcohol.  Learn about heart failure and get support as needed.  Get help to maintain or improve your quality of life and your ability to care for yourself as needed. GET HELP IF:   You gain weight quickly.  You are more short of breath than usual.  You cannot do your normal activities.  You tire easily.  You cough more than normal, especially with activity.  You have any or more puffiness (swelling) in areas such as your hands, feet, ankles, or belly (abdomen).  You cannot sleep because it is hard to breathe.  You feel like your heart is beating fast (palpitations).  You get dizzy or light-headed when you stand up. GET HELP RIGHT AWAY IF:   You have trouble breathing.  There is a change in mental status, such as becoming less alert or not being able to focus.  You have chest pain or discomfort.  You faint. MAKE SURE YOU:   Understand these instructions.  Will watch your condition.  Will get help right away if you are not doing well or get worse.   This information is not intended to replace advice given to you by your health care provider. Make sure you discuss any questions you have with your health care provider.   Document Released: 08/17/2008 Document Revised: 11/29/2014 Document Reviewed: 12/25/2012 Elsevier Interactive Patient Education [employer].

## 2016-03-04 ENCOUNTER — Encounter: Payer: Self-pay | Admitting: Internal Medicine

## 2016-03-04 ENCOUNTER — Ambulatory Visit (INDEPENDENT_AMBULATORY_CARE_PROVIDER_SITE_OTHER): Payer: Medicare Other | Admitting: Internal Medicine

## 2016-03-04 VITALS — BP 128/70 | HR 74 | Temp 98.3°F | Wt 291.0 lb

## 2016-03-04 DIAGNOSIS — E785 Hyperlipidemia, unspecified: Secondary | ICD-10-CM

## 2016-03-04 DIAGNOSIS — R609 Edema, unspecified: Secondary | ICD-10-CM | POA: Diagnosis not present

## 2016-03-04 DIAGNOSIS — I10 Essential (primary) hypertension: Secondary | ICD-10-CM

## 2016-03-04 DIAGNOSIS — R600 Localized edema: Secondary | ICD-10-CM | POA: Insufficient documentation

## 2016-03-04 DIAGNOSIS — R7302 Impaired glucose tolerance (oral): Secondary | ICD-10-CM

## 2016-03-04 MED ORDER — MELOXICAM 7.5 MG PO TABS: 7.5000 mg | ORAL_TABLET | Freq: Every day | ORAL | Status: DC

## 2016-03-04 MED ORDER — FUROSEMIDE 40 MG PO TABS: 40.0000 mg | ORAL_TABLET | Freq: Every day | ORAL | Status: DC

## 2016-03-04 NOTE — Assessment & Plan Note (Signed)
Improved with increased, to cont the lasix 40 qd, foir f/u BMP,  to f/u any worsening symptoms or concerns

## 2016-03-04 NOTE — Assessment & Plan Note (Addendum)
stable overall by history and exam, recent data reviewed with pt, and pt to continue medical treatment as before,  to f/u any worsening symptoms or concerns Lab Results  Component Value Date   LDLCALC 114* 11/12/2015   decliens statin, for low chol diet

## 2016-03-04 NOTE — Progress Notes (Signed)
Pre visit review using our clinic review tool, if applicable. No additional management support is needed unless otherwise documented below in the visit note. 

## 2016-03-04 NOTE — Assessment & Plan Note (Signed)
stable overall by history and exam, recent data reviewed with pt, and pt to continue medical treatment as before,  to f/u any worsening symptoms or concerns BP Readings from Last 3 Encounters:  03/04/16 128/70  02/27/16 157/96  11/26/15 168/76

## 2016-03-04 NOTE — Assessment & Plan Note (Signed)
stable overall by history and exam, recent data reviewed with pt, and pt to continue medical treatment as before,  to f/u any worsening symptoms or concerns Lab Results  Component Value Date   HGBA1C 5.6 11/12/2015

## 2016-03-04 NOTE — Progress Notes (Signed)
Subjective:    Patient ID: Dylan Benton, male    DOB: 1941-02-21, 75 y.o.   MRN: [id number]  HPI  Here to f/u  - had Increased edema since decreased lasix from 40 to 20 mg, now taking 40 mg again since edema worsened and seen at [facility]  Lost 7 lbs. Wt Readings from Last 3 Encounters:  03/04/16 291 lb (131.997 kg)  11/26/15 298 lb (135.172 kg)  11/12/15 291 lb (131.997 kg)  Edema now improved.  Pt denies chest pain, increased sob or doe, wheezing, orthopnea, PND, increased LE swelling, palpitations, dizziness or syncope.  Pt denies new neurological symptoms such as new headache, or facial or extremity weakness or numbness   Pt denies polydipsia, polyuria,  BP Readings from Last 3 Encounters:  03/04/16 128/70  02/27/16 157/96  11/26/15 168/76   Past Medical History  Diagnosis Date  . Anemia, unspecified     NOS ?resolved  . Prostate cancer ([place])     Hx of   . Hypertension   . Chest pain   . Aortic root dilation ([facility])   . Obesity   . H pylori ulcer     teated H pylori  . Impaired glucose tolerance 05/05/2012  . Male stress incontinence 05/05/2012  . Erectile dysfunction 05/05/2012  . PUD (peptic ulcer disease) 05/05/2012  . GERD (gastroesophageal reflux disease) 05/05/2012  . ECHOCARDIOGRAM, ABNORMAL 12/26/2006     Aortic root dilation  This problem surfaced in 2006 after a cardiology referral for chest pain.  He was seen by Dr. Haroldine Laws in January o6 and cathed on Jan. 18.  Coronaries were nl and EF was 65%.  Echo showed mild aortic root dilation of 47mm. He was started on metoprolol (although I notice that he is no longer on this.  Annual echo to follow aortic root was advised.  Aortic root dimension was unchanged on studies in 2009 and 2010.  Will continue to follow this, perhaps not every year as it seems stable.   Marland Kitchen PROSTATE CANCER, HX OF 08/06/2009    Annotation: prostatectomy and radiotherapy in 1990s,  Dr. Jeffie Pollock Qualifier: Diagnosis of  By: Kelton Pillar MD, [place]    . ASTHMA,  UNSPECIFIED, UNSPECIFIED STATUS 08/06/2009    Annotation: No exacerbations.  Qualifier: Diagnosis of  By: Kelton Pillar MD, [place]    . OBESITY 10/06/2006    Qualifier: Diagnosis of  By: Stann Mainland MD, Nicole Kindred    . 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 Edilia Bo Qualifier: Diagnosis of  By: Kelton Pillar MD, [place]    . Bladder calculus 05/05/2012  . Anxiety   . Arthritis   . Hyperlipidemia 05/08/2012  . Lumbar disc disease 05/08/2012  . Allergy   . Cataract   . Myocardial infarction [facility])     pt states, "i've been told I have had two heart attacks  . History of back surgery   . Thoracic aortic aneurysm [facility])    Past Surgical History  Procedure Laterality Date  . Eye surgury    . Ear surgury    . Prostatectomy    . Back surgery      reports that he quit smoking about 27 years ago. He has never used smokeless tobacco. He reports that he does not drink alcohol or use illicit drugs. family history is negative for Colon cancer, Esophageal cancer, Rectal cancer, and Stomach cancer. No Known Allergies Current Outpatient Prescriptions on File Prior to Visit  Medication Sig Dispense Refill  . COMBIGAN  0.2-0.5 % ophthalmic solution Take 1 drop by mouth every 12 (twelve) hours.     . gabapentin (NEURONTIN) 100 MG capsule Take 1 capsule (100 mg total) by mouth 3 (three) times daily. 90 capsule 5  . ibuprofen (ADVIL,MOTRIN) 200 MG tablet Take 200 mg by mouth every 6 (six) hours as needed for moderate pain.    . Multiple Vitamin (MULTIVITAMIN WITH MINERALS) TABS tablet Take 1 tablet by mouth daily.    Marland Kitchen PROAIR HFA 108 (90 BASE) MCG/ACT inhaler USE AS DIRECTED 8.5 Inhaler 5  . ranitidine (ZANTAC 75) 75 MG tablet Take 1 tablet (75 mg total) by mouth daily as needed for heartburn. 90 tablet 3  . sodium chloride (OCEAN) 0.65 % SOLN nasal spray Place 1 spray into both nostrils as needed for congestion.    . TRAVATAN Z 0.004 % ophthalmic solution Place 1 drop into the right  eye at bedtime.     . triamcinolone cream (KENALOG) 0.1 % Apply 1 application topically 2 (two) times daily. 30 g 0  . furosemide (LASIX) 20 MG tablet Take 1 tablet (20 mg total) by mouth daily. (Patient not taking: Reported on 03/04/2016) 90 tablet 3   No current facility-administered medications on file prior to visit.    Review of Systems  Constitutional: Negative for unusual diaphoresis or night sweats HENT: Negative for ear swelling or discharge Eyes: Negative for worsening visual haziness  Respiratory: Negative for choking and stridor.   Gastrointestinal: Negative for distension or worsening eructation Genitourinary: Negative for retention or change in urine volume.  Musculoskeletal: Negative for other MSK pain or swelling Skin: Negative for color change and worsening wound Neurological: Negative for tremors and numbness other than noted  Psychiatric/Behavioral: Negative for decreased concentration or agitation other than above       Objective:   Physical Exam BP 128/70 mmHg  Pulse 74  Temp(Src) 98.3 F (36.8 C) (Oral)  Wt 291 lb (131.997 kg)  SpO2 96% VS noted,  Constitutional: Pt appears in no apparent distress HENT: Head: NCAT.  Right Ear: External ear normal.  Left Ear: External ear normal.  Eyes: . Pupils are equal, round, and reactive to light. Conjunctivae and EOM are normal Neck: Normal range of motion. Neck supple.  Cardiovascular: Normal rate and regular rhythm.   Pulmonary/Chest: Effort normal and breath sounds without rales or wheezing.  Abd:  Soft, NT, ND, + BS Neurological: Pt is alert. Not confused , motor grossly intact Skin: Skin is warm. No rash, trace RLE edema, no LLE edema Psychiatric: Pt behavior is normal. No agitation.      Assessment & Plan:

## 2016-03-04 NOTE — Patient Instructions (Signed)
OK to continue the furosemide at 40 mg per day  Please continue all other medications as before, and refills have been done if requested.  Please have the pharmacy call with any other refills you may need.  Please continue your efforts at being more active, low cholesterol diet, and weight control.  Please keep your appointments with your specialists as you may have planned  Please go to the LAB in the Basement (turn left off the elevator) for the tests to be done IN ONE WEEK  You will be contacted by phone if any changes need to be made immediately.  Otherwise, you will receive a letter about your results with an explanation, but please check with MyChart first.  Please remember to sign up for MyChart if you have not done so, as this will be important to you in the future with finding out test results, communicating by private email, and scheduling acute appointments online when needed.  Please return in 6 months, or sooner if needed

## 2016-03-08 DIAGNOSIS — C61 Malignant neoplasm of prostate: Secondary | ICD-10-CM | POA: Diagnosis not present

## 2016-03-16 DIAGNOSIS — Z0000 Encounter for general adult medical examination without abnormal findings: Secondary | ICD-10-CM | POA: Diagnosis not present

## 2016-03-16 DIAGNOSIS — N390 Urinary tract infection, site not specified: Secondary | ICD-10-CM | POA: Diagnosis not present

## 2016-03-16 DIAGNOSIS — C61 Malignant neoplasm of prostate: Secondary | ICD-10-CM | POA: Diagnosis not present

## 2016-03-16 DIAGNOSIS — N5201 Erectile dysfunction due to arterial insufficiency: Secondary | ICD-10-CM | POA: Diagnosis not present

## 2016-03-16 DIAGNOSIS — N393 Stress incontinence (female) (male): Secondary | ICD-10-CM | POA: Diagnosis not present

## 2016-05-13 ENCOUNTER — Other Ambulatory Visit (INDEPENDENT_AMBULATORY_CARE_PROVIDER_SITE_OTHER): Payer: Medicare Other

## 2016-05-13 ENCOUNTER — Ambulatory Visit (INDEPENDENT_AMBULATORY_CARE_PROVIDER_SITE_OTHER): Payer: Medicare Other | Admitting: Internal Medicine

## 2016-05-13 ENCOUNTER — Encounter: Payer: Self-pay | Admitting: Internal Medicine

## 2016-05-13 VITALS — BP 130/72 | HR 94 | Temp 98.1°F | Resp 20 | Wt 292.0 lb

## 2016-05-13 DIAGNOSIS — I5020 Unspecified systolic (congestive) heart failure: Secondary | ICD-10-CM

## 2016-05-13 DIAGNOSIS — I10 Essential (primary) hypertension: Secondary | ICD-10-CM | POA: Diagnosis not present

## 2016-05-13 DIAGNOSIS — R7302 Impaired glucose tolerance (oral): Secondary | ICD-10-CM | POA: Diagnosis not present

## 2016-05-13 DIAGNOSIS — I5022 Chronic systolic (congestive) heart failure: Secondary | ICD-10-CM | POA: Diagnosis not present

## 2016-05-13 DIAGNOSIS — E785 Hyperlipidemia, unspecified: Secondary | ICD-10-CM

## 2016-05-13 HISTORY — DX: Unspecified systolic (congestive) heart failure: I50.20

## 2016-05-13 LAB — URINALYSIS, ROUTINE W REFLEX MICROSCOPIC
Bilirubin Urine: NEGATIVE
HGB URINE DIPSTICK: NEGATIVE
KETONES UR: NEGATIVE
Leukocytes, UA: NEGATIVE
NITRITE: NEGATIVE
RBC / HPF: NONE SEEN (ref 0–?)
Specific Gravity, Urine: 1.025 (ref 1.000–1.030)
TOTAL PROTEIN, URINE-UPE24: NEGATIVE
URINE GLUCOSE: NEGATIVE
UROBILINOGEN UA: 1 (ref 0.0–1.0)
pH: 5.5 (ref 5.0–8.0)

## 2016-05-13 LAB — BASIC METABOLIC PANEL
BUN: 12 mg/dL (ref 6–23)
CHLORIDE: 102 meq/L (ref 96–112)
CO2: 32 mEq/L (ref 19–32)
CREATININE: 0.94 mg/dL (ref 0.40–1.50)
Calcium: 9.2 mg/dL (ref 8.4–10.5)
GFR: 100.02 mL/min (ref >60.00)
Glucose, Bld: 135 mg/dL — ABNORMAL HIGH (ref 70–99)
Potassium: 3.8 mEq/L (ref 3.5–5.1)
Sodium: 138 mEq/L (ref 135–145)

## 2016-05-13 LAB — HEPATIC FUNCTION PANEL
ALBUMIN: 4.2 g/dL (ref 3.5–5.2)
ALT: 14 U/L (ref 0–53)
AST: 18 U/L (ref 0–37)
Alkaline Phosphatase: 68 U/L (ref 39–117)
BILIRUBIN TOTAL: 0.8 mg/dL (ref 0.2–1.2)
Bilirubin, Direct: 0.1 mg/dL (ref 0.0–0.3)
TOTAL PROTEIN: 7 g/dL (ref 6.0–8.3)

## 2016-05-13 LAB — CBC WITH DIFFERENTIAL/PLATELET
BASOS ABS: 0 10*3/uL (ref 0.0–0.1)
BASOS PCT: 0.6 % (ref 0.0–3.0)
EOS ABS: 0.1 10*3/uL (ref 0.0–0.7)
Eosinophils Relative: 1.4 % (ref 0.0–5.0)
HEMATOCRIT: 42 % (ref 39.0–52.0)
HEMOGLOBIN: 14.1 g/dL (ref 13.0–17.0)
LYMPHS PCT: 39 % (ref 12.0–46.0)
Lymphs Abs: 1.8 10*3/uL (ref 0.7–4.0)
MCHC: 33.7 g/dL (ref 30.0–36.0)
MCV: 95 fl (ref 78.0–100.0)
Monocytes Absolute: 0.5 10*3/uL (ref 0.1–1.0)
Monocytes Relative: 11.4 % (ref 3.0–12.0)
Neutro Abs: 2.2 10*3/uL (ref 1.4–7.7)
Neutrophils Relative %: 47.6 % (ref 43.0–77.0)
Platelets: 184 10*3/uL (ref 150.0–400.0)
RBC: 4.42 Mil/uL (ref 4.22–5.81)
RDW: 12.6 % (ref 11.5–15.5)
WBC: 4.6 10*3/uL (ref 4.0–10.5)

## 2016-05-13 LAB — LIPID PANEL
CHOL/HDL RATIO: 4
Cholesterol: 161 mg/dL (ref 0–200)
HDL: 44 mg/dL (ref >39.00)
LDL Cholesterol: 101 mg/dL — ABNORMAL HIGH (ref 0–99)
NonHDL: 117.28
TRIGLYCERIDES: 82 mg/dL (ref 0.0–149.0)
VLDL: 16.4 mg/dL (ref 0.0–40.0)

## 2016-05-13 LAB — HEMOGLOBIN A1C: HEMOGLOBIN A1C: 5.6 % (ref 4.6–6.5)

## 2016-05-13 LAB — TSH: TSH: 1.32 u[IU]/mL (ref 0.35–4.50)

## 2016-05-13 NOTE — Progress Notes (Signed)
Subjective:    Patient ID: Dylan Benton, male    DOB: May 27, 1941, 75 y.o.   MRN: [id number]  HPI   Here for yearly  f/u;  Overall doing ok;  Pt denies Chest pain, worsening SOB, DOE, wheezing, orthopnea, PND, worsening LE edema, palpitations,  or syncope but with occas dizziness he thinks may be due to wax in the ears., also mentions some increased use of inhaler for astham on days working in the yard, ow no worsening cough or sob. .  Pt denies neurological change such as new headache, facial or extremity weakness.  Pt denies polydipsia, polyuria, or low sugar symptoms. Pt states overall good compliance with treatment and medications, good tolerability, and has been trying to follow appropriate diet.  Pt denies worsening depressive symptoms, suicidal ideation or panic. No fever, night sweats, wt loss, loss of appetite, or other constitutional symptoms.  Pt states good ability with ADL's, has low fall risk, home safety reviewed and adequate, no other significant changes in hearing or vision, and only occasionally active with exercise. In fact gaining wt, cant walk outside the 2-3 miles per day liek he used to due back pain.  Pt continues to have recurring LBP without change in severity, bowel or bladder change, fever, wt loss,  worsening LE pain/numbness/weakness, gait change or falls.  Riding the bike just doesn't get the same exercise. Hard to lose wt. Hard to get less than 280's at home without clothes. Decelins statin today, with goal LDL < 70.   Wt Readings from Last 3 Encounters:  05/13/16 292 lb (132.45 kg)  03/04/16 291 lb (131.997 kg)  11/26/15 298 lb (135.172 kg)   Lab Results  Component Value Date   [facility] 114* 11/12/2015  Sees urology with yearly psa, last done April 2017 per pt Past Medical History  Diagnosis Date  . Anemia, unspecified     NOS ?resolved  . Prostate cancer ([place])     Hx of   . Hypertension   . Chest pain   . Aortic root dilation ([facility])   . Obesity   . H  pylori ulcer     teated H pylori  . Impaired glucose tolerance 05/05/2012  . Male stress incontinence 05/05/2012  . Erectile dysfunction 05/05/2012  . PUD (peptic ulcer disease) 05/05/2012  . GERD (gastroesophageal reflux disease) 05/05/2012  . ECHOCARDIOGRAM, ABNORMAL 12/26/2006     Aortic root dilation  This problem surfaced in 2006 after a cardiology referral for chest pain.  He was seen by Dr. Haroldine Laws in January o6 and cathed on Jan. 18.  Coronaries were nl and EF was 65%.  Echo showed mild aortic root dilation of 65mm. He was started on metoprolol (although I notice that he is no longer on this.  Annual echo to follow aortic root was advised.  Aortic root dimension was unchanged on studies in 2009 and 2010.  Will continue to follow this, perhaps not every year as it seems stable.   Marland Kitchen PROSTATE CANCER, HX OF 08/06/2009    Annotation: prostatectomy and radiotherapy in 1990s,  Dr. Jeffie Pollock Qualifier: Diagnosis of  By: Kelton Pillar MD, [place]    . ASTHMA, UNSPECIFIED, UNSPECIFIED STATUS 08/06/2009    Annotation: No exacerbations.  Qualifier: Diagnosis of  By: Kelton Pillar MD, [place]    . OBESITY 10/06/2006    Qualifier: Diagnosis of  By: Stann Mainland MD, Nicole Kindred    . 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 Edilia Bo Qualifier: Diagnosis of  ByKelton Pillar MD, [place]    . Bladder calculus 05/05/2012  . Anxiety   . Arthritis   . Hyperlipidemia 05/08/2012  . Lumbar disc disease 05/08/2012  . Allergy   . Cataract   . Myocardial infarction [facility])     pt states, "i've been told I have had two heart attacks  . History of back surgery   . Thoracic aortic aneurysm [facility])    Past Surgical History  Procedure Laterality Date  . Eye surgury    . Ear surgury    . Prostatectomy    . Back surgery      reports that he quit smoking about 27 years ago. He has never used smokeless tobacco. He reports that he does not drink alcohol or use illicit drugs. family history is negative for Colon  cancer, Esophageal cancer, Rectal cancer, and Stomach cancer. No Known Allergies Current Outpatient Prescriptions on File Prior to Visit  Medication Sig Dispense Refill  . COMBIGAN 0.2-0.5 % ophthalmic solution Take 1 drop by mouth every 12 (twelve) hours.     . furosemide (LASIX) 20 MG tablet Take 1 tablet (20 mg total) by mouth daily. 90 tablet 3  . furosemide (LASIX) 40 MG tablet Take 1 tablet (40 mg total) by mouth daily. 90 tablet 3  . gabapentin (NEURONTIN) 100 MG capsule Take 1 capsule (100 mg total) by mouth 3 (three) times daily. 90 capsule 5  . ibuprofen (ADVIL,MOTRIN) 200 MG tablet Take 200 mg by mouth every 6 (six) hours as needed for moderate pain.    . meloxicam (MOBIC) 7.5 MG tablet Take 1 tablet (7.5 mg total) by mouth daily. 90 tablet 1  . Multiple Vitamin (MULTIVITAMIN WITH MINERALS) TABS tablet Take 1 tablet by mouth daily.    Marland Kitchen PROAIR HFA 108 (90 BASE) MCG/ACT inhaler USE AS DIRECTED 8.5 Inhaler 5  . ranitidine (ZANTAC 75) 75 MG tablet Take 1 tablet (75 mg total) by mouth daily as needed for heartburn. 90 tablet 3  . sodium chloride (OCEAN) 0.65 % SOLN nasal spray Place 1 spray into both nostrils as needed for congestion.    . TRAVATAN Z 0.004 % ophthalmic solution Place 1 drop into the right eye at bedtime.     . triamcinolone cream (KENALOG) 0.1 % Apply 1 application topically 2 (two) times daily. 30 g 0   No current facility-administered medications on file prior to visit.   Review of Systems Constitutional: Negative for increased diaphoresis, or other activity, appetite or siginficant weight change other than noted HENT: Negative for worsening hearing loss, ear pain, facial swelling, mouth sores and neck stiffness.   Eyes: Negative for other worsening pain, redness or visual disturbance.  Respiratory: Negative for choking or stridor Cardiovascular: Negative for other chest pain and palpitations.  Gastrointestinal: Negative for worsening diarrhea, blood in stool, or  abdominal distention Genitourinary: Negative for hematuria, flank pain or change in urine volume.  Musculoskeletal: Negative for myalgias or other joint complaints.  Skin: Negative for other color change and wound or drainage.  Neurological: Negative for syncope and numbness. other than noted Hematological: Negative for adenopathy. or other swelling Psychiatric/Behavioral: Negative for hallucinations, SI, self-injury, decreased concentration or other worsening agitation.      Objective:   Physical Exam BP 130/72 mmHg  Pulse 94  Temp(Src) 98.1 F (36.7 C) (Oral)  Resp 20  Wt 292 lb (132.45 kg)  SpO2 94% VS noted,  Constitutional: Pt is oriented to person, place, and time. Appears well-developed and well-nourished, in  no significant distress Head: Normocephalic and atraumatic  Eyes: Conjunctivae and EOM are normal. Pupils are equal, round, and reactive to light Right Ear: External ear normal.  Left Ear: External ear normal Nose: Nose normal.  Mouth/Throat: Oropharynx is clear and moist  Neck: Normal range of motion. Neck supple. No JVD present. No tracheal deviation present or significant neck LA or mass Cardiovascular: Normal rate, regular rhythm, normal heart sounds and intact distal pulses.   Pulmonary/Chest: Effort normal and breath sounds without rales or wheezing  Abdominal: Soft. Bowel sounds are normal. NT. No HSM  Musculoskeletal: Normal range of motion. Exhibits slight to trace LE edema to left ankle/foot only Lymphadenopathy: Has no cervical adenopathy.  Neurological: Pt is alert and oriented to person, place, and time. Pt has normal reflexes. No cranial nerve deficit. Motor grossly intact Skin: Skin is warm and dry. No rash noted or new ulcers Psychiatric:  Has normal mood and affect. Behavior is normal.    Most recent echo:  Result status: Final result      [facility]  [address], [place] [address]  [phone]  ------------------------------------------------------------------- Transthoracic Echocardiography  Patient: Dylan Benton, Dylan Benton MR #: [id number] Study Date: 12/08/2015 Gender: M Age: 25 Height: 193 cm Weight: 135.2 kg BSA: 2.74 m^2 Pt. Status: Room:  ATTENDING [facility] REFERRING Biagio Borg PERFORMING [facility], Outpatient SONOGRAPHER [facility], RDCS  cc:  ------------------------------------------------------------------- LV EF: 45% - 50%  ------------------------------------------------------------------- Indications: Syncope (R55).  ------------------------------------------------------------------- History: PMH: Myocardial infarction. Risk factors: History of Prostate Cancer, Asthma, Aortic Root Dilation, Obesity, Thoracic Aortic Aneurysm. Family history of coronary artery disease. Former tobacco use. Hypertension. Dyslipidemia.  ------------------------------------------------------------------- Study Conclusions  - Left ventricle: Posterior lateral and inferobasal hypokinesis.  The cavity size was mildly dilated. Wall thickness was increased  in a pattern of mild LVH. Systolic function was mildly reduced.  The estimated ejection fraction was in the range of 45% to 50%. - Aortic valve: Dilated sinuses. - Atrial septum: No defect or patent foramen ovale was identified.       Assessment & Plan:

## 2016-05-13 NOTE — Assessment & Plan Note (Signed)
stable overall by history and exam, recent data reviewed with pt, and pt to continue medical treatment as before,  to f/u any worsening symptoms or concerns BP Readings from Last 3 Encounters:  05/13/16 130/72  03/04/16 128/70  02/27/16 157/96

## 2016-05-13 NOTE — Assessment & Plan Note (Signed)
stable overall by history and exam, recent data reviewed with pt, and pt to continue medical treatment as before,  to f/u any worsening symptoms or concerns Lab Results  Component Value Date   HGBA1C 5.6 11/12/2015

## 2016-05-13 NOTE — Patient Instructions (Signed)

## 2016-05-13 NOTE — Assessment & Plan Note (Signed)
stable overall by history and exam, recent data reviewed with pt, and pt to continue medical treatment as before,  to f/u any worsening symptoms or concerns Lab Results  Component Value Date   LDLCALC 114* 11/12/2015   Goal < 70, declines statin

## 2016-05-13 NOTE — Assessment & Plan Note (Signed)
.  euvolemic today, for f/u lab,  to f/u any worsening symptoms or concerns, cont daily wts

## 2016-05-13 NOTE — Progress Notes (Signed)
Pre visit review using our clinic review tool, if applicable. No additional management support is needed unless otherwise documented below in the visit note. 

## 2016-06-16 DIAGNOSIS — H401133 Primary open-angle glaucoma, bilateral, severe stage: Secondary | ICD-10-CM | POA: Diagnosis not present

## 2016-09-07 ENCOUNTER — Ambulatory Visit: Payer: Medicare Other | Admitting: Internal Medicine

## 2016-09-20 DIAGNOSIS — H401133 Primary open-angle glaucoma, bilateral, severe stage: Secondary | ICD-10-CM | POA: Diagnosis not present

## 2016-11-09 ENCOUNTER — Other Ambulatory Visit (INDEPENDENT_AMBULATORY_CARE_PROVIDER_SITE_OTHER): Payer: Medicare Other

## 2016-11-09 ENCOUNTER — Ambulatory Visit (INDEPENDENT_AMBULATORY_CARE_PROVIDER_SITE_OTHER): Payer: Medicare Other | Admitting: Internal Medicine

## 2016-11-09 VITALS — BP 140/80 | HR 64 | Temp 98.6°F | Resp 20 | Wt 288.0 lb

## 2016-11-09 DIAGNOSIS — M5416 Radiculopathy, lumbar region: Secondary | ICD-10-CM | POA: Insufficient documentation

## 2016-11-09 DIAGNOSIS — M6530 Trigger finger, unspecified finger: Secondary | ICD-10-CM

## 2016-11-09 DIAGNOSIS — I10 Essential (primary) hypertension: Secondary | ICD-10-CM | POA: Diagnosis not present

## 2016-11-09 DIAGNOSIS — E785 Hyperlipidemia, unspecified: Secondary | ICD-10-CM | POA: Diagnosis not present

## 2016-11-09 DIAGNOSIS — E559 Vitamin D deficiency, unspecified: Secondary | ICD-10-CM

## 2016-11-09 DIAGNOSIS — R7302 Impaired glucose tolerance (oral): Secondary | ICD-10-CM

## 2016-11-09 DIAGNOSIS — R252 Cramp and spasm: Secondary | ICD-10-CM

## 2016-11-09 DIAGNOSIS — R202 Paresthesia of skin: Secondary | ICD-10-CM | POA: Insufficient documentation

## 2016-11-09 DIAGNOSIS — R42 Dizziness and giddiness: Secondary | ICD-10-CM

## 2016-11-09 DIAGNOSIS — I5022 Chronic systolic (congestive) heart failure: Secondary | ICD-10-CM

## 2016-11-09 DIAGNOSIS — R5383 Other fatigue: Secondary | ICD-10-CM | POA: Insufficient documentation

## 2016-11-09 LAB — CBC WITH DIFFERENTIAL/PLATELET
BASOS ABS: 0 10*3/uL (ref 0.0–0.1)
Basophils Relative: 0.6 % (ref 0.0–3.0)
Eosinophils Absolute: 0 10*3/uL (ref 0.0–0.7)
Eosinophils Relative: 1 % (ref 0.0–5.0)
HCT: 43.7 % (ref 39.0–52.0)
Hemoglobin: 14.8 g/dL (ref 13.0–17.0)
LYMPHS ABS: 1.6 10*3/uL (ref 0.7–4.0)
Lymphocytes Relative: 39.1 % (ref 12.0–46.0)
MCHC: 33.8 g/dL (ref 30.0–36.0)
MCV: 95.2 fl (ref 78.0–100.0)
MONOS PCT: 10.5 % (ref 3.0–12.0)
Monocytes Absolute: 0.4 10*3/uL (ref 0.1–1.0)
NEUTROS ABS: 2 10*3/uL (ref 1.4–7.7)
NEUTROS PCT: 48.8 % (ref 43.0–77.0)
PLATELETS: 197 10*3/uL (ref 150.0–400.0)
RBC: 4.59 Mil/uL (ref 4.22–5.81)
RDW: 12.2 % (ref 11.5–15.5)
WBC: 4.1 10*3/uL (ref 4.0–10.5)

## 2016-11-09 LAB — VITAMIN D 25 HYDROXY (VIT D DEFICIENCY, FRACTURES): VITD: 35.3 ng/mL (ref 30.00–100.00)

## 2016-11-09 LAB — LIPID PANEL
Cholesterol: 171 mg/dL (ref 0–200)
HDL: 45.9 mg/dL (ref >39.00)
LDL Cholesterol: 107 mg/dL — ABNORMAL HIGH (ref 0–99)
NONHDL: 125.09
Total CHOL/HDL Ratio: 4
Triglycerides: 91 mg/dL (ref 0.0–149.0)
VLDL: 18.2 mg/dL (ref 0.0–40.0)

## 2016-11-09 LAB — BASIC METABOLIC PANEL
BUN: 11 mg/dL (ref 6–23)
CALCIUM: 9.4 mg/dL (ref 8.4–10.5)
CHLORIDE: 101 meq/L (ref 96–112)
CO2: 32 mEq/L (ref 19–32)
Creatinine, Ser: 1 mg/dL (ref 0.40–1.50)
GFR: 93.01 mL/min (ref >60.00)
Glucose, Bld: 125 mg/dL — ABNORMAL HIGH (ref 70–99)
Potassium: 4.4 mEq/L (ref 3.5–5.1)
SODIUM: 138 meq/L (ref 135–145)

## 2016-11-09 LAB — HEPATIC FUNCTION PANEL
ALK PHOS: 68 U/L (ref 39–117)
ALT: 14 U/L (ref 0–53)
AST: 20 U/L (ref 0–37)
Albumin: 4.4 g/dL (ref 3.5–5.2)
BILIRUBIN DIRECT: 0.2 mg/dL (ref 0.0–0.3)
TOTAL PROTEIN: 7.3 g/dL (ref 6.0–8.3)
Total Bilirubin: 0.8 mg/dL (ref 0.2–1.2)

## 2016-11-09 LAB — TSH: TSH: 1.26 u[IU]/mL (ref 0.35–4.50)

## 2016-11-09 LAB — HEMOGLOBIN A1C: HEMOGLOBIN A1C: 5.5 % (ref 4.6–6.5)

## 2016-11-09 NOTE — Progress Notes (Signed)
Subjective:    Patient ID: Dylan Benton, male    DOB: 03/15/1941, 75 y.o.   MRN: [id number]  HPI  Here to f/u, c/o 4th finger right hand with palmar tendon issue and mild trigger finger but still functional.  Has bilat tingling toes of unclear etiology, but legs o/w ok.  Also getting lab worsening leg cramping for several wks.  Etiology unclear, Exam otherwise benign, to check labs as documented, follow with expectant management  Pt denies new neurological symptoms such as new headache, or facial or extremity weakness or numbness, though has chronic LUE cervical radiculpathy symtpoms of pain, numb/weakness. Wt has reduced with a low at home or 275 before thanksgiving, here seems to have lost wt.  Wt Readings from Last 3 Encounters:  11/09/16 288 lb (130.6 kg)  05/13/16 292 lb (132.5 kg)  03/04/16 291 lb (132 kg)  Pt denies chest pain, increased sob or doe, wheezing, orthopnea, PND, increased LE swelling, palpitations, dizziness or syncope. Past Medical History:  Diagnosis Date  . Allergy   . Anemia, unspecified    NOS ?resolved  . Anxiety   . Aortic root dilation (HCC)   . Arthritis   . ASTHMA, UNSPECIFIED, UNSPECIFIED STATUS 08/06/2009   Annotation: No exacerbations.  Qualifier: Diagnosis of  By: Kelton Pillar MD, [place]    . Bladder calculus 05/05/2012  . Cataract   . Chest pain   . ECHOCARDIOGRAM, ABNORMAL 12/26/2006    Aortic root dilation  This problem surfaced in 2006 after a cardiology referral for chest pain.  He was seen by Dr. Haroldine Laws in January o6 and cathed on Jan. 18.  Coronaries were nl and EF was 65%.  Echo showed mild aortic root dilation of 79mm. He was started on metoprolol (although I notice that he is no longer on this.  Annual echo to follow aortic root was advised.  Aortic root dimension was unchanged on studies in 2009 and 2010.  Will continue to follow this, perhaps not every year as it seems stable.   . Erectile dysfunction 05/05/2012  . GERD (gastroesophageal reflux  disease) 05/05/2012  . H pylori ulcer    teated H pylori  . History of back surgery   . Hyperlipidemia 05/08/2012  . Hypertension   . Impaired glucose tolerance 05/05/2012  . Lumbar disc disease 05/08/2012  . Male stress incontinence 05/05/2012  . Myocardial infarction    pt states, "i've been told I have had two heart attacks  . Obesity   . OBESITY 10/06/2006   Qualifier: Diagnosis of  By: Stann Mainland MD, Nicole Kindred    . Prostate cancer ([place])    Hx of   . PROSTATE CANCER, HX OF 08/06/2009   Annotation: prostatectomy and radiotherapy in 1990s,  Dr. Jeffie Pollock Qualifier: Diagnosis of  By: Kelton Pillar MD, [place]    . PUD (peptic ulcer disease) 05/05/2012  . Systolic CHF ([place]) 123456  . Thoracic aortic aneurysm ([place])   . 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 Edilia Bo Qualifier: Diagnosis of  By: Kelton Pillar MD, [place]     Past Surgical History:  Procedure Laterality Date  . BACK SURGERY    . ear surgury    . eye surgury    . PROSTATECTOMY      reports that he quit smoking about 27 years ago. He has never used smokeless tobacco. He reports that he does not drink alcohol or use drugs. family history is not on file. No Known Allergies Current Outpatient Prescriptions on  File Prior to Visit  Medication Sig Dispense Refill  . COMBIGAN 0.2-0.5 % ophthalmic solution Take 1 drop by mouth every 12 (twelve) hours.     . furosemide (LASIX) 20 MG tablet Take 1 tablet (20 mg total) by mouth daily. 90 tablet 3  . furosemide (LASIX) 40 MG tablet Take 1 tablet (40 mg total) by mouth daily. 90 tablet 3  . gabapentin (NEURONTIN) 100 MG capsule Take 1 capsule (100 mg total) by mouth 3 (three) times daily. 90 capsule 5  . ibuprofen (ADVIL,MOTRIN) 200 MG tablet Take 200 mg by mouth every 6 (six) hours as needed for moderate pain.    . meloxicam (MOBIC) 7.5 MG tablet Take 1 tablet (7.5 mg total) by mouth daily. 90 tablet 1  . Multiple Vitamin (MULTIVITAMIN WITH MINERALS) TABS tablet  Take 1 tablet by mouth daily.    Marland Kitchen PROAIR HFA 108 (90 BASE) MCG/ACT inhaler USE AS DIRECTED 8.5 Inhaler 5  . ranitidine (ZANTAC 75) 75 MG tablet Take 1 tablet (75 mg total) by mouth daily as needed for heartburn. 90 tablet 3  . sodium chloride (OCEAN) 0.65 % SOLN nasal spray Place 1 spray into both nostrils as needed for congestion.    . TRAVATAN Z 0.004 % ophthalmic solution Place 1 drop into the right eye at bedtime.      No current facility-administered medications on file prior to visit.    Review of Systems .    Objective:   Physical Exam     1+ bipedal edema  ECG I have personally interpreted Sinus  Rhythm  Low voltage in precordial leads.   -Poor R-wave progression -may be secondary to pulmonary disease   consider old anterior infarct.  Transthoracic Echocardiography 12/08/2015 Study Conclusions  - Left ventricle: Posterior lateral and inferobasal hypokinesis.   The cavity size was mildly dilated. Wall thickness was increased   in a pattern of mild LVH. Systolic function was mildly reduced.   The estimated ejection fraction was in the range of 45% to 50%. - Aortic valve: Dilated sinuses. - Atrial septum: No defect or patent foramen ovale was identified.    Assessment & Plan:

## 2016-11-09 NOTE — Patient Instructions (Addendum)
Please take OTC Magnesium and B12 supplements daily  Please continue all other medications as before, and refills have been done if requested.  Please have the pharmacy call with any other refills you may need.  Please continue your efforts at being more active, low cholesterol diet, and weight control.  Please keep your appointments with your specialists as you may have planned  Please go to the LAB in the Basement (turn left off the elevator) for the tests to be done today  You will be contacted by phone if any changes need to be made immediately.  Otherwise, you will receive a letter about your results with an explanation, but please check with MyChart first.  Please remember to sign up for MyChart if you have not done so, as this will be important to you in the future with finding out test results, communicating by private email, and scheduling acute appointments online when needed.  Please return in 6 months, or sooner if needed

## 2016-11-09 NOTE — Progress Notes (Signed)
Pre visit review using our clinic review tool, if applicable. No additional management support is needed unless otherwise documented below in the visit note. 

## 2016-11-14 NOTE — Assessment & Plan Note (Signed)
stable overall by history and exam, recent data reviewed with pt, and pt to continue medical treatment as before,  to f/u any worsening symptoms or concerns Lab Results  Component Value Date   HGBA1C 5.5 11/09/2016

## 2016-11-14 NOTE — Assessment & Plan Note (Addendum)
stable overall by history and exam,  and pt to continue medical treatment as before,  to f/u any worsening symptoms or concerns  Note:  Total time for pt hx, exam, review of record with pt in the room, determination of diagnoses and plan for further eval and tx is > 40 min, with over 50% spent in coordination and counseling of patient  

## 2016-11-14 NOTE — Assessment & Plan Note (Signed)
Mild, not functionally limiting, d/w pt natrual history, may need eventual hand surgury,  to f/u any worsening symptoms or concerns

## 2016-11-14 NOTE — Assessment & Plan Note (Signed)
For b12 check,  to f/u any worsening symptoms or concerns

## 2016-11-14 NOTE — Assessment & Plan Note (Signed)
No daytime somnolence, etiology unclear,  to f/u any worsening symptoms or concerns  Lab Results  Component Value Date   WBC 4.1 11/09/2016   HGB 14.8 11/09/2016   HCT 43.7 11/09/2016   PLT 197.0 11/09/2016   GLUCOSE 125 (H) 11/09/2016   CHOL 171 11/09/2016   TRIG 91.0 11/09/2016   HDL 45.90 11/09/2016   LDLCALC 107 (H) 11/09/2016   ALT 14 11/09/2016   AST 20 11/09/2016   NA 138 11/09/2016   K 4.4 11/09/2016   CL 101 11/09/2016   CREATININE 1.00 11/09/2016   BUN 11 11/09/2016   CO2 32 11/09/2016   TSH 1.26 11/09/2016   PSA 1.13 05/13/2015   HGBA1C 5.5 11/09/2016

## 2016-11-14 NOTE — Assessment & Plan Note (Signed)
stable overall by history and exam, recent data reviewed with pt, and pt to continue medical treatment as before,  to f/u any worsening symptoms or concerns BP Readings from Last 3 Encounters:  11/09/16 140/80  05/13/16 130/72  03/04/16 128/70

## 2016-11-14 NOTE — Assessment & Plan Note (Signed)
Chronic persistent, mobic helps prn,  to f/u any worsening symptoms or concerns

## 2016-11-14 NOTE — Assessment & Plan Note (Signed)
Also for b12 and mg otc supplements

## 2016-11-14 NOTE — Assessment & Plan Note (Signed)
stable overall by history and exam, recent data reviewed with pt, and pt to continue medical treatment as before,  to f/u any worsening symptoms or concerns Lab Results  Component Value Date   LDLCALC 107 (H) 11/09/2016

## 2016-11-18 ENCOUNTER — Telehealth: Payer: Self-pay | Admitting: Internal Medicine

## 2016-11-18 NOTE — Telephone Encounter (Signed)
Patient would like to know if he needs to take vit b12.

## 2016-11-19 NOTE — Telephone Encounter (Signed)
Called patient unable to reach left message to give is a call back

## 2016-11-19 NOTE — Telephone Encounter (Signed)
There is no low b12 level in labs reviewed on the EMR for last 3 yrs  OK to take otc Vit B12 if he desires (one per day) as this is not harmful

## 2016-11-19 NOTE — Telephone Encounter (Signed)
Inform pt of massage below, pt stated he is taking med that effecting the way his body absorp B12. He is going to take OTC vit B12.

## 2017-01-05 DIAGNOSIS — H401133 Primary open-angle glaucoma, bilateral, severe stage: Secondary | ICD-10-CM | POA: Diagnosis not present

## 2017-01-19 ENCOUNTER — Other Ambulatory Visit: Payer: Self-pay | Admitting: Internal Medicine

## 2017-01-19 DIAGNOSIS — H401133 Primary open-angle glaucoma, bilateral, severe stage: Secondary | ICD-10-CM | POA: Diagnosis not present

## 2017-02-11 ENCOUNTER — Encounter (HOSPITAL_COMMUNITY): Payer: Self-pay | Admitting: Emergency Medicine

## 2017-02-11 ENCOUNTER — Ambulatory Visit (HOSPITAL_COMMUNITY)
Admission: EM | Admit: 2017-02-11 | Discharge: 2017-02-11 | Disposition: A | Discharge status: Home / Self Care | Payer: Medicare Other

## 2017-02-11 DIAGNOSIS — R42 Dizziness and giddiness: Secondary | ICD-10-CM | POA: Diagnosis not present

## 2017-02-11 DIAGNOSIS — R5383 Other fatigue: Secondary | ICD-10-CM

## 2017-02-11 NOTE — ED Triage Notes (Signed)
PT reports dizziness, lightheadedness, and weakness for a year. PT reports it has been worse this week. PT's PCP has been adjusting his meds to try to make it better.

## 2017-02-11 NOTE — Discharge Instructions (Signed)
Continue your meclizine and may take 2 as rx'd.  Please follow up with your pcp and consider getting sleep study as discussed with PCP.

## 2017-03-02 ENCOUNTER — Telehealth: Payer: Self-pay | Admitting: Internal Medicine

## 2017-03-03 ENCOUNTER — Other Ambulatory Visit: Payer: Self-pay | Admitting: Internal Medicine

## 2017-03-03 ENCOUNTER — Ambulatory Visit (INDEPENDENT_AMBULATORY_CARE_PROVIDER_SITE_OTHER): Payer: Medicare Other | Admitting: Internal Medicine

## 2017-03-03 ENCOUNTER — Encounter: Payer: Self-pay | Admitting: Internal Medicine

## 2017-03-03 VITALS — BP 180/88 | HR 82 | Temp 100.5°F | Ht 75.0 in | Wt 294.0 lb

## 2017-03-03 DIAGNOSIS — R062 Wheezing: Secondary | ICD-10-CM | POA: Diagnosis not present

## 2017-03-03 DIAGNOSIS — R059 Cough, unspecified: Secondary | ICD-10-CM | POA: Insufficient documentation

## 2017-03-03 DIAGNOSIS — I10 Essential (primary) hypertension: Secondary | ICD-10-CM

## 2017-03-03 DIAGNOSIS — R05 Cough: Secondary | ICD-10-CM

## 2017-03-03 MED ORDER — PREDNISONE 10 MG PO TABS: ORAL_TABLET | ORAL | 0 refills | Status: DC

## 2017-03-03 MED ORDER — METHYLPREDNISOLONE ACETATE 80 MG/ML IJ SUSP
80.0000 mg | Freq: Once | INTRAMUSCULAR | Status: AC
  Administered 2017-03-03: 80 mg via INTRAMUSCULAR

## 2017-03-03 MED ORDER — HYDROCODONE-HOMATROPINE 5-1.5 MG/5ML PO SYRP: 5.0000 mL | ORAL_SOLUTION | Freq: Four times a day (QID) | ORAL | 0 refills | Status: DC | PRN

## 2017-03-03 MED ORDER — LEVOFLOXACIN 250 MG PO TABS: 250.0000 mg | ORAL_TABLET | Freq: Every day | ORAL | 0 refills | Status: DC

## 2017-03-03 NOTE — Patient Instructions (Signed)
Please take all new medication as prescribed - the antibiotic, cough medicine as needed, and prednisone  You had the steroid shot today  Please continue all other medications as before, and refills have been done if requested.  Please have the pharmacy call with any other refills you may need.  Please keep your appointments with your specialists as you may have planned

## 2017-03-03 NOTE — Progress Notes (Signed)
Subjective:    Patient ID: Dylan Benton, male    DOB: 1941-03-29, 76 y.o.   MRN: [id number]  HPI  Here with acute onset mild to mod 2-3 days ST, HA, general weakness and malaise, with prod cough greenish sputum, but Pt denies chest pain, increased sob or doe, wheezing, orthopnea, PND, increased LE swelling, palpitations, dizziness or syncope, except for onset mild wheezing/sob last PM.  Pt denies new neurological symptoms such as new headache, or facial or extremity weakness or numbness   Pt denies polydipsia, polyuria. Quit tobacco in 1990 Past Medical History:  Diagnosis Date  . Allergy   . Anemia, unspecified    NOS ?resolved  . Anxiety   . Aortic root dilation (HCC)   . Arthritis   . ASTHMA, UNSPECIFIED, UNSPECIFIED STATUS 08/06/2009   Annotation: No exacerbations.  Qualifier: Diagnosis of  By: Kelton Pillar MD, [place]    . Bladder calculus 05/05/2012  . Cataract   . Chest pain   . ECHOCARDIOGRAM, ABNORMAL 12/26/2006    Aortic root dilation  This problem surfaced in 2006 after a cardiology referral for chest pain.  He was seen by Dr. Haroldine Laws in January o6 and cathed on Jan. 18.  Coronaries were nl and EF was 65%.  Echo showed mild aortic root dilation of 37mm. He was started on metoprolol (although I notice that he is no longer on this.  Annual echo to follow aortic root was advised.  Aortic root dimension was unchanged on studies in 2009 and 2010.  Will continue to follow this, perhaps not every year as it seems stable.   . Erectile dysfunction 05/05/2012  . GERD (gastroesophageal reflux disease) 05/05/2012  . H pylori ulcer    teated H pylori  . History of back surgery   . Hyperlipidemia 05/08/2012  . Hypertension   . Impaired glucose tolerance 05/05/2012  . Lumbar disc disease 05/08/2012  . Male stress incontinence 05/05/2012  . Myocardial infarction [facility])    pt states, "i've been told I have had two heart attacks  . Obesity   . OBESITY 10/06/2006   Qualifier: Diagnosis of  By: Stann Mainland  MD, Nicole Kindred    . Prostate cancer ([place])    Hx of   . PROSTATE CANCER, HX OF 08/06/2009   Annotation: prostatectomy and radiotherapy in 1990s,  Dr. Jeffie Pollock Qualifier: Diagnosis of  By: Kelton Pillar MD, [place]    . PUD (peptic ulcer disease) 05/05/2012  . Systolic CHF ([place]) 02/28/8118  . Thoracic aortic aneurysm ([employer])   . 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 Edilia Bo Qualifier: Diagnosis of  By: Kelton Pillar MD, Mad[place]     Past Surgical History:  Procedure Laterality Date  . BACK SURGERY    . ear surgury    . eye surgury    . PROSTATECTOMY      reports that he quit smoking about 28 years ago. He has never used smokeless tobacco. He reports that he does not drink alcohol or use drugs. family history is not on file. No Known Allergies Current Outpatient Prescriptions on File Prior to Visit  Medication Sig Dispense Refill  . COMBIGAN 0.2-0.5 % ophthalmic solution Take 1 drop by mouth every 12 (twelve) hours.     . gabapentin (NEURONTIN) 100 MG capsule Take 1 capsule (100 mg total) by mouth 3 (three) times daily. 90 capsule 5  . ibuprofen (ADVIL,MOTRIN) 200 MG tablet Take 200 mg by mouth every 6 (six) hours as needed for moderate pain.    Marland Kitchen  meloxicam (MOBIC) 7.5 MG tablet Take 1 tablet (7.5 mg total) by mouth daily. 90 tablet 1  . Multiple Vitamin (MULTIVITAMIN WITH MINERALS) TABS tablet Take 1 tablet by mouth daily.    Marland Kitchen PROAIR HFA 108 (90 Base) MCG/ACT inhaler USE AS DIRECTED 8.5 Inhaler 2  . ranitidine (ZANTAC 75) 75 MG tablet Take 1 tablet (75 mg total) by mouth daily as needed for heartburn. 90 tablet 3  . sodium chloride (OCEAN) 0.65 % SOLN nasal spray Place 1 spray into both nostrils as needed for congestion.    . TRAVATAN Z 0.004 % ophthalmic solution Place 1 drop into the right eye at bedtime.      No current facility-administered medications on file prior to visit.    Review of Systems  Constitutional: Negative for other unusual diaphoresis or  sweats HENT: Negative for ear discharge or swelling Eyes: Negative for other worsening visual disturbances Respiratory: Negative for stridor or other swelling  Gastrointestinal: Negative for worsening distension or other blood Genitourinary: Negative for retention or other urinary change Musculoskeletal: Negative for other MSK pain or swelling Skin: Negative for color change or other new lesions Neurological: Negative for worsening tremors and other numbness  Psychiatric/Behavioral: Negative for worsening agitation or other fatigue All other system neg per pt    Objective:   Physical Exam BP (!) 180/88   Pulse 82   Temp (!) 100.5 F (38.1 C) (Oral)   Ht 6\' 3"  (1.905 m)   Wt 294 lb (133.4 kg)   SpO2 98%   BMI 36.75 kg/m  VS noted, mild ill Constitutional: Pt appears in NAD HENT: Head: NCAT.  Right Ear: External ear normal.  Left Ear: External ear normal.  Bilat tm's with mild erythema.  Max sinus areas non tender.  Pharynx with mild erythema, no exudate Eyes: . Pupils are equal, round, and reactive to light. Conjunctivae and EOM are normal Nose: without d/c or deformity Neck: Neck supple. Gross normal ROM Cardiovascular: Normal rate and regular rhythm.   Pulmonary/Chest: Effort normal and breath sounds decreased without rales but with mild bilat scattered wheezing.  Abd:  Soft, NT, ND, + BS, no organomegaly Neurological: Pt is alert. At baseline orientation, motor grossly intact Skin: Skin is warm. No rashes, other new lesions, no LE edema Psychiatric: Pt behavior is normal without agitation  No other exam findings    Assessment & Plan:

## 2017-03-03 NOTE — Progress Notes (Signed)
Pre visit review using our clinic review tool, if applicable. No additional management support is needed unless otherwise documented below in the visit note. 

## 2017-03-05 NOTE — Assessment & Plan Note (Signed)
Mild to mod, c/w bronchitis vs pna, declines cxr, for antibx course, cough med prn,  to f/u any worsening symptoms or concerns 

## 2017-03-05 NOTE — Assessment & Plan Note (Signed)
Mild to mod, for depomedrol IM 80, declines inhaler prn,  to f/u any worsening symptoms or concerns

## 2017-03-05 NOTE — Assessment & Plan Note (Signed)
Repeat BP today 159/82, mild elevated likely situational, o/w stable overall by history and exam, recent data reviewed with pt, declines any med change today, and pt to continue medical treatment as before,  to f/u any worsening symptoms or concerns BP Readings from Last 3 Encounters:  03/03/17 (!) 180/88  02/11/17 (!) 152/79  11/09/16 140/80

## 2017-03-10 ENCOUNTER — Telehealth: Payer: Self-pay | Admitting: Internal Medicine

## 2017-03-10 DIAGNOSIS — R05 Cough: Secondary | ICD-10-CM

## 2017-03-10 DIAGNOSIS — R059 Cough, unspecified: Secondary | ICD-10-CM

## 2017-03-10 NOTE — Telephone Encounter (Signed)
I have ordered cxr for today  Let me know if he is not able for today as a different order for tomorrow will be needed

## 2017-03-10 NOTE — Telephone Encounter (Signed)
Called pt, LVM informing him of below msg from Dr. Jenny Reichmann.

## 2017-03-10 NOTE — Telephone Encounter (Signed)
Pt called in and said that he is not any better. He said that Dr Jenny Reichmann told him if he was not better to call back and he would put in a chest xray to see what is going on?    Best number  [phone]

## 2017-03-11 NOTE — Addendum Note (Signed)
Addended by: Biagio Borg on: 03/11/2017 12:59 PM   Modules accepted: Orders

## 2017-03-11 NOTE — Telephone Encounter (Signed)
Pt wants to come in today to get xray, can you put in new order?    Best number [phone]

## 2017-03-11 NOTE — Telephone Encounter (Signed)
[facility] for cxr today- have reordered

## 2017-03-12 ENCOUNTER — Emergency Department (HOSPITAL_COMMUNITY): Payer: Medicare Other

## 2017-03-12 DIAGNOSIS — J069 Acute upper respiratory infection, unspecified: Secondary | ICD-10-CM | POA: Insufficient documentation

## 2017-03-12 DIAGNOSIS — Z79899 Other long term (current) drug therapy: Secondary | ICD-10-CM | POA: Insufficient documentation

## 2017-03-12 DIAGNOSIS — I5020 Unspecified systolic (congestive) heart failure: Secondary | ICD-10-CM | POA: Insufficient documentation

## 2017-03-12 DIAGNOSIS — I110 Hypertensive heart disease with heart failure: Secondary | ICD-10-CM | POA: Insufficient documentation

## 2017-03-12 DIAGNOSIS — I252 Old myocardial infarction: Secondary | ICD-10-CM | POA: Diagnosis not present

## 2017-03-12 DIAGNOSIS — Z87891 Personal history of nicotine dependence: Secondary | ICD-10-CM | POA: Diagnosis not present

## 2017-03-12 DIAGNOSIS — J45909 Unspecified asthma, uncomplicated: Secondary | ICD-10-CM | POA: Insufficient documentation

## 2017-03-12 DIAGNOSIS — Z8546 Personal history of malignant neoplasm of prostate: Secondary | ICD-10-CM | POA: Insufficient documentation

## 2017-03-12 DIAGNOSIS — R509 Fever, unspecified: Secondary | ICD-10-CM | POA: Diagnosis not present

## 2017-03-12 LAB — CBC WITH DIFFERENTIAL/PLATELET
BASOS ABS: 0 10*3/uL (ref 0.0–0.1)
Basophils Relative: 1 %
Eosinophils Absolute: 0.1 10*3/uL (ref 0.0–0.7)
Eosinophils Relative: 2 %
HEMATOCRIT: 40.6 % (ref 39.0–52.0)
Hemoglobin: 13.7 g/dL (ref 13.0–17.0)
LYMPHS ABS: 2.2 10*3/uL (ref 0.7–4.0)
LYMPHS PCT: 47 %
MCH: 31.8 pg (ref 26.0–34.0)
MCHC: 33.7 g/dL (ref 30.0–36.0)
MCV: 94.2 fL (ref 78.0–100.0)
MONO ABS: 0.5 10*3/uL (ref 0.1–1.0)
Monocytes Relative: 11 %
NEUTROS ABS: 1.8 10*3/uL (ref 1.7–7.7)
Neutrophils Relative %: 39 %
Platelets: 202 10*3/uL (ref 150–400)
RBC: 4.31 MIL/uL (ref 4.22–5.81)
RDW: 12.2 % (ref 11.5–15.5)
WBC: 4.5 10*3/uL (ref 4.0–10.5)

## 2017-03-12 LAB — COMPREHENSIVE METABOLIC PANEL
ALK PHOS: 68 U/L (ref 38–126)
ALT: 21 U/L (ref 17–63)
AST: 33 U/L (ref 15–41)
Albumin: 3.9 g/dL (ref 3.5–5.0)
Anion gap: 8 (ref 5–15)
BUN: 9 mg/dL (ref 6–20)
CALCIUM: 9.2 mg/dL (ref 8.9–10.3)
CHLORIDE: 103 mmol/L (ref 101–111)
CO2: 28 mmol/L (ref 22–32)
CREATININE: 1.14 mg/dL (ref 0.61–1.24)
GFR calc Af Amer: >60 mL/min (ref >60)
GFR, EST NON AFRICAN AMERICAN: 60 mL/min — AB (ref >60)
Glucose, Bld: 153 mg/dL — ABNORMAL HIGH (ref 65–99)
Potassium: 3.9 mmol/L (ref 3.5–5.1)
Sodium: 139 mmol/L (ref 135–145)
Total Bilirubin: 0.8 mg/dL (ref 0.3–1.2)
Total Protein: 6.8 g/dL (ref 6.5–8.1)

## 2017-03-12 LAB — URINALYSIS, ROUTINE W REFLEX MICROSCOPIC
Glucose, UA: NEGATIVE mg/dL
Hgb urine dipstick: NEGATIVE
Ketones, ur: NEGATIVE mg/dL
LEUKOCYTES UA: NEGATIVE
Nitrite: NEGATIVE
Protein, ur: NEGATIVE mg/dL
SPECIFIC GRAVITY, URINE: 1.031 — AB (ref 1.005–1.030)
pH: 5 (ref 5.0–8.0)

## 2017-03-12 LAB — I-STAT CG4 LACTIC ACID, ED: LACTIC ACID, VENOUS: 1.43 mmol/L (ref 0.5–1.9)

## 2017-03-12 NOTE — ED Triage Notes (Signed)
Pt here for fever, pt was dx with respiratory infection and was placed on antibiotics which the pt finished. The pt also received a steroid shot and cough medicine with relief. Pt states that he finished the antibiotics yesterday and his fever went up to 100.4 today. Afebrile in triage. Pt was told that if his temp went up to go to the ED. VSS.

## 2017-03-13 ENCOUNTER — Emergency Department (HOSPITAL_COMMUNITY)
Admission: EM | Admit: 2017-03-13 | Discharge: 2017-03-13 | Disposition: A | Discharge status: Home / Self Care | Payer: Medicare Other | Attending: Emergency Medicine | Admitting: Emergency Medicine

## 2017-03-13 DIAGNOSIS — J069 Acute upper respiratory infection, unspecified: Secondary | ICD-10-CM

## 2017-03-13 NOTE — ED Provider Notes (Signed)
TIME SEEN: 1:50 AM By signing my name below, I, Dylan Benton, attest that this documentation has been prepared under the direction and in the presence of Dylan Jabs, DO Electronically Signed: Hilbert Benton, Scribe. 03/13/17. 2:00 AM.   CHIEF COMPLAINT: Fever and Cough  HPI: HPI Comments: Dylan Benton is a 76 y.o. male with history of hypertension, hyperlipidemia, thoracic aortic aneurysm who presents to the Emergency Department complaining of fever and cough for the past couple of weeks. He was recently seen o 03/03/2017 and was given steroids and antibiotics for a bacterial infection. He has completed his course of antibiotics with no relief in symptoms. He took a dose of aleve prior to coming to the ED today with relief in his fever. He denies chest pain, shortness of breath, nausea, vomiting, or diarrhea today.   ROS: See HPI Constitutional: Fever  Eyes: no drainage  ENT: no runny nose   Cardiovascular:  no chest pain  Resp: Cough. no SOB  GI: no vomiting, nausea, or diarrhea. GU: no dysuria Integumentary: no rash  Allergy: no hives  Musculoskeletal: no leg swelling  Neurological: no slurred speech ROS otherwise negative  PAST MEDICAL HISTORY/PAST SURGICAL HISTORY:  Past Medical History:  Diagnosis Date  . Allergy   . Anemia, unspecified    NOS ?resolved  . Anxiety   . Aortic root dilation (HCC)   . Arthritis   . ASTHMA, UNSPECIFIED, UNSPECIFIED STATUS 08/06/2009   Annotation: No exacerbations.  Qualifier: Diagnosis of  By: Kelton Pillar MD, [place]    . Bladder calculus 05/05/2012  . Cataract   . Chest pain   . ECHOCARDIOGRAM, ABNORMAL 12/26/2006    Aortic root dilation  This problem surfaced in 2006 after a cardiology referral for chest pain.  He was seen by Dr. Haroldine Laws in January o6 and cathed on Jan. 18.  Coronaries were nl and EF was 65%.  Echo showed mild aortic root dilation of 30mm. He was started on metoprolol (although I notice that he is no longer on this.   Annual echo to follow aortic root was advised.  Aortic root dimension was unchanged on studies in 2009 and 2010.  Will continue to follow this, perhaps not every year as it seems stable.   . Erectile dysfunction 05/05/2012  . GERD (gastroesophageal reflux disease) 05/05/2012  . H pylori ulcer    teated H pylori  . History of back surgery   . Hyperlipidemia 05/08/2012  . Hypertension   . Impaired glucose tolerance 05/05/2012  . Lumbar disc disease 05/08/2012  . Male stress incontinence 05/05/2012  . Myocardial infarction [facility])    pt states, "i've been told I have had two heart attacks  . Obesity   . OBESITY 10/06/2006   Qualifier: Diagnosis of  By: Stann Mainland MD, Nicole Kindred    . Prostate cancer ([place])    Hx of   . PROSTATE CANCER, HX OF 08/06/2009   Annotation: prostatectomy and radiotherapy in 1990s,  Dr. Jeffie Pollock Qualifier: Diagnosis of  By: Kelton Pillar MD, [place]    . PUD (peptic ulcer disease) 05/05/2012  . Systolic CHF ([place]) 7/82/9562  . Thoracic aortic aneurysm ([place])   . 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 Edilia Bo Qualifier: Diagnosis of  By: Kelton Pillar MD, [place]      MEDICATIONS:  Prior to Admission medications   Medication Sig Start Date End Date Taking? Authorizing Provider  albuterol (PROVENTIL HFA;VENTOLIN HFA) 108 (90 Base) MCG/ACT inhaler Inhale 1-2 puffs into the lungs every  6 (six) hours as needed for wheezing or shortness of breath.   Yes Historical Provider, MD  COMBIGAN 0.2-0.5 % ophthalmic solution Take 1 drop by mouth every 12 (twelve) hours.  04/21/11  Yes Historical Provider, MD  furosemide (LASIX) 40 MG tablet TAKE 1 TABLET BY MOUTH DAILY 03/03/17  Yes Biagio Borg, MD  meloxicam (MOBIC) 7.5 MG tablet Take 1 tablet (7.5 mg total) by mouth daily. Patient taking differently: Take 7.5 mg by mouth daily as needed.  03/04/16  Yes Biagio Borg, MD  Multiple Vitamin (MULTIVITAMIN WITH MINERALS) TABS tablet Take 1 tablet by mouth daily.   Yes  Historical Provider, MD  TRAVATAN Z 0.004 % ophthalmic solution Place 1 drop into both eyes at bedtime.  04/21/11  Yes Historical Provider, MD  gabapentin (NEURONTIN) 100 MG capsule Take 1 capsule (100 mg total) by mouth 3 (three) times daily. Patient not taking: Reported on 03/13/2017 05/14/15   Biagio Borg, MD  HYDROcodone-homatropine [facility]) 5-1.5 MG/5ML syrup Take 5 mLs by mouth every 6 (six) hours as needed for cough. Patient not taking: Reported on 03/13/2017 03/03/17 03/13/17  Biagio Borg, MD  levofloxacin (LEVAQUIN) 250 MG tablet Take 1 tablet (250 mg total) by mouth daily. Patient not taking: Reported on 03/13/2017 03/03/17 03/13/17  Biagio Borg, MD  predniSONE (DELTASONE) 10 MG tablet 2 tabs by mouth per day for 5 days Patient not taking: Reported on 03/13/2017 03/03/17   Biagio Borg, MD  PROAIR HFA [phone] Base) MCG/ACT inhaler USE AS DIRECTED Patient not taking: Reported on 03/13/2017 01/19/17   Biagio Borg, MD  ranitidine (ZANTAC 75) 75 MG tablet Take 1 tablet (75 mg total) by mouth daily as needed for heartburn. Patient not taking: Reported on 03/13/2017 11/06/12   Biagio Borg, MD    ALLERGIES:  No Known Allergies  SOCIAL HISTORY:  Social History  Substance Use Topics  . Smoking status: Former Smoker    Quit date: 12/02/1988  . Smokeless tobacco: Never Used  . Alcohol use No    FAMILY HISTORY: Family History  Problem Relation Age of Onset  . Coronary artery disease    . Hypertension    . Stroke    . Colon cancer Neg Hx   . Esophageal cancer Neg Hx   . Rectal cancer Neg Hx   . Stomach cancer Neg Hx     EXAM: BP (!) 158/68 (BP Location: Right Arm)   Pulse 67   Temp 98.6 F (37 C) (Oral)   Resp 18   Ht 6\' 3"  (1.905 m)   Wt 285 lb (129.3 kg)   SpO2 97%   BMI 35.62 kg/m  CONSTITUTIONAL: Alert and oriented and responds appropriately to questions. Well-appearing; well-nourished HEAD: Normocephalic EYES: Conjunctivae clear, PERRL ENT: normal nose; no rhinorrhea;  moist mucous membranes; pharynx without lesions noted; No pharyngeal erythema or petechiae, no tonsillar hypertrophy or exudate, no uvular deviation, no unilateral swelling, no trismus or drooling, no muffled voice, normal phonation, no stridor, no dental caries present, no drainable dental abscess noted, no Ludwig's angina, tongue sits flat in the bottom of the mouth, no angioedema, no facial erythema or warmth, no facial swelling; no pain with movement of the neck. NECK: Supple, no meningismus, no LAD  CARD: RRR; S1 and S2 appreciated; no murmurs, no clicks, no rubs, no gallops RESP: Normal chest excursion without splinting or tachypnea; breath sounds clear and equal bilaterally; no wheezes, no rhonchi, no rales, no crackles ABD/GI:  Normal bowel sounds; non-distended; soft, non-tender, no rebound, no guarding BACK:  The back appears normal and is non-tender to palpation, there is no CVA tenderness EXT: Normal ROM in all joints; non-tender to palpation; no edema; normal capillary refill; no cyanosis    SKIN: Normal color for age and race; warm NEURO: Moves all extremities equally PSYCH: The patient's mood and manner are appropriate. Grooming and personal hygiene are appropriate.  MEDICAL DECISION MAKING: Patient here with viral URI symptoms. I suspect the reason he did not get better with Levaquin as that this was likely viral onset of bacterial. His lactate is normal. White blood cell count normal. Urine shows no sign of infection and his chest x-ray is clear. His lungs are completely clear to auscultation with good aeration and no hypoxia or increased work of breathing. I feel he is safe to be discharged home and take Tylenol as needed for fever and pain and follow-up with his primary care physician as needed. I do not feel he needs another course of antibiotics or steroids. Patient and wife are comfortable with this plan. Recommended over-the-counter Mucinex for cough.   At this time, I do not feel  there is any life-threatening condition present. I have reviewed and discussed all results (EKG, imaging, lab, urine as appropriate) and exam findings with patient/family. I have reviewed nursing notes and appropriate previous records.  I feel the patient is safe to be discharged home without further emergent workup and can continue workup as an outpatient as needed. Discussed usual and customary return precautions. Patient/family verbalize understanding and are comfortable with this plan.  Outpatient follow-up has been provided if needed. All questions have been answered.   I personally performed the services described in this documentation, which was scribed in my presence. The recorded information has been reviewed and is accurate.    [place], DO 03/13/17 [phone]

## 2017-03-13 NOTE — ED Notes (Signed)
Pt departed in NAD, escorted by this RN.  

## 2017-03-13 NOTE — Discharge Instructions (Signed)
Your labs, chest x-ray were normal today. I do not feel you need another round of antibiotics. This is likely a virus causing your symptoms. You may continue Tylenol 1000 mg every 6 hours as needed for fever and pain. If your symptoms continue, please follow up with her primary care provider.  You may use over-the-counter Mucinex as needed for cough.

## 2017-03-14 DIAGNOSIS — R972 Elevated prostate specific antigen [PSA]: Secondary | ICD-10-CM | POA: Diagnosis not present

## 2017-03-21 DIAGNOSIS — N5201 Erectile dysfunction due to arterial insufficiency: Secondary | ICD-10-CM | POA: Diagnosis not present

## 2017-03-21 DIAGNOSIS — N393 Stress incontinence (female) (male): Secondary | ICD-10-CM | POA: Diagnosis not present

## 2017-03-21 DIAGNOSIS — C61 Malignant neoplasm of prostate: Secondary | ICD-10-CM | POA: Diagnosis not present

## 2017-03-21 DIAGNOSIS — N320 Bladder-neck obstruction: Secondary | ICD-10-CM | POA: Diagnosis not present

## 2017-05-10 ENCOUNTER — Ambulatory Visit (INDEPENDENT_AMBULATORY_CARE_PROVIDER_SITE_OTHER): Payer: Medicare Other | Admitting: Internal Medicine

## 2017-05-10 ENCOUNTER — Encounter: Payer: Self-pay | Admitting: Internal Medicine

## 2017-05-10 ENCOUNTER — Telehealth: Payer: Self-pay

## 2017-05-10 ENCOUNTER — Other Ambulatory Visit: Payer: Self-pay | Admitting: Internal Medicine

## 2017-05-10 ENCOUNTER — Other Ambulatory Visit (INDEPENDENT_AMBULATORY_CARE_PROVIDER_SITE_OTHER): Payer: Medicare Other

## 2017-05-10 VITALS — BP 128/72 | HR 63 | Ht 75.0 in | Wt 290.0 lb

## 2017-05-10 DIAGNOSIS — I10 Essential (primary) hypertension: Secondary | ICD-10-CM

## 2017-05-10 DIAGNOSIS — E785 Hyperlipidemia, unspecified: Secondary | ICD-10-CM | POA: Diagnosis not present

## 2017-05-10 DIAGNOSIS — R7302 Impaired glucose tolerance (oral): Secondary | ICD-10-CM

## 2017-05-10 LAB — BASIC METABOLIC PANEL
BUN: 12 mg/dL (ref 6–23)
CALCIUM: 9.6 mg/dL (ref 8.4–10.5)
CO2: 33 mEq/L — ABNORMAL HIGH (ref 19–32)
Chloride: 101 mEq/L (ref 96–112)
Creatinine, Ser: 1.04 mg/dL (ref 0.40–1.50)
GFR: 88.78 mL/min (ref >60.00)
GLUCOSE: 138 mg/dL — AB (ref 70–99)
POTASSIUM: 4.2 meq/L (ref 3.5–5.1)
Sodium: 139 mEq/L (ref 135–145)

## 2017-05-10 LAB — LIPID PANEL
CHOL/HDL RATIO: 4
CHOLESTEROL: 183 mg/dL (ref 0–200)
HDL: 47.8 mg/dL (ref >39.00)
LDL Cholesterol: 119 mg/dL — ABNORMAL HIGH (ref 0–99)
NonHDL: 135.28
TRIGLYCERIDES: 79 mg/dL (ref 0.0–149.0)
VLDL: 15.8 mg/dL (ref 0.0–40.0)

## 2017-05-10 LAB — HEPATIC FUNCTION PANEL
ALBUMIN: 4.3 g/dL (ref 3.5–5.2)
ALT: 13 U/L (ref 0–53)
AST: 16 U/L (ref 0–37)
Alkaline Phosphatase: 70 U/L (ref 39–117)
BILIRUBIN TOTAL: 0.8 mg/dL (ref 0.2–1.2)
Bilirubin, Direct: 0.2 mg/dL (ref 0.0–0.3)
Total Protein: 7.1 g/dL (ref 6.0–8.3)

## 2017-05-10 LAB — HEMOGLOBIN A1C: Hgb A1c MFr Bld: 5.9 % (ref 4.6–6.5)

## 2017-05-10 MED ORDER — ROSUVASTATIN CALCIUM 10 MG PO TABS: 10.0000 mg | ORAL_TABLET | Freq: Every day | ORAL | 3 refills | Status: DC

## 2017-05-10 NOTE — Patient Instructions (Signed)

## 2017-05-10 NOTE — Progress Notes (Signed)
Subjective:    Patient ID: Dylan Benton, male    DOB: [birth year], 76 y.o.   MRN: [id number]  HPI      Here for yealy f/u;  Overall doing ok;  Pt denies Chest pain, worsening SOB, DOE, wheezing, orthopnea, PND, worsening LE edema, palpitations, dizziness or syncope.  Pt denies neurological change such as new headache, facial or extremity weakness.  Pt denies polydipsia, polyuria, or low sugar symptoms. Pt states overall good compliance with treatment and medications, good tolerability, and has been trying to follow appropriate diet.  Pt denies worsening depressive symptoms, suicidal ideation or panic. No fever, night sweats, wt loss, loss of appetite, or other constitutional symptoms.  Pt states good ability with ADL's, has low fall risk, home safety reviewed and adequate, no other significant changes in hearing or vision, and only occasionally active with exercise, and cant seem to get wt down below 275 (and mostly at 285) due to left sicatica recurrent he cant seem to get past.   Due for f/u with Dr Olevia Perches in jan 2019, and due for f/u colonoscopy 2019 as well.  Past Medical History:  Diagnosis Date  . Allergy   . Anemia, unspecified    NOS ?resolved  . Anxiety   . Aortic root dilation (HCC)   . Arthritis   . ASTHMA, UNSPECIFIED, UNSPECIFIED STATUS 08/06/2009   Annotation: No exacerbations.  Qualifier: Diagnosis of  By: Kelton Pillar MD, [place]    . Bladder calculus 05/05/2012  . Cataract   . Chest pain   . ECHOCARDIOGRAM, ABNORMAL 12/26/2006    Aortic root dilation  This problem surfaced in 2006 after a cardiology referral for chest pain.  He was seen by Dr. Haroldine Laws in January o6 and cathed on Jan. 18.  Coronaries were nl and EF was 65%.  Echo showed mild aortic root dilation of 71mm. He was started on metoprolol (although I notice that he is no longer on this.  Annual echo to follow aortic root was advised.  Aortic root dimension was unchanged on studies in 2009 and 2010.  Will continue to  follow this, perhaps not every year as it seems stable.   . Erectile dysfunction 05/05/2012  . GERD (gastroesophageal reflux disease) 05/05/2012  . H pylori ulcer    teated H pylori  . History of back surgery   . Hyperlipidemia 05/08/2012  . Hypertension   . Impaired glucose tolerance 05/05/2012  . Lumbar disc disease 05/08/2012  . Male stress incontinence 05/05/2012  . Myocardial infarction [facility])    pt states, "i've been told I have had two heart attacks  . Obesity   . OBESITY 10/06/2006   Qualifier: Diagnosis of  By: Stann Mainland MD, Nicole Kindred    . Prostate cancer ([place])    Hx of   . PROSTATE CANCER, HX OF 08/06/2009   Annotation: prostatectomy and radiotherapy in 1990s,  Dr. Jeffie Pollock Qualifier: Diagnosis of  By: Kelton Pillar MD, [place]    . PUD (peptic ulcer disease) 05/05/2012  . Systolic CHF ([place]) 0/92/3300  . Thoracic aortic aneurysm ([place])   . 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 Edilia Bo Qualifier: Diagnosis of  By: Kelton Pillar MD, [place]     Past Surgical History:  Procedure Laterality Date  . BACK SURGERY    . ear surgury    . eye surgury    . PROSTATECTOMY      reports that he quit smoking about 28 years ago. He has never used smokeless tobacco.  He reports that he does not drink alcohol or use drugs. family history is not on file. No Known Allergies Current Outpatient Prescriptions on File Prior to Visit  Medication Sig Dispense Refill  . albuterol (PROVENTIL HFA;VENTOLIN HFA) 108 (90 Base) MCG/ACT inhaler Inhale 1-2 puffs into the lungs every 6 (six) hours as needed for wheezing or shortness of breath.    . COMBIGAN 0.2-0.5 % ophthalmic solution Take 1 drop by mouth every 12 (twelve) hours.     . furosemide (LASIX) 40 MG tablet TAKE 1 TABLET BY MOUTH DAILY 90 tablet 3  . meloxicam (MOBIC) 7.5 MG tablet Take 1 tablet (7.5 mg total) by mouth daily. (Patient taking differently: Take 7.5 mg by mouth daily as needed. ) 90 tablet 1  . Multiple Vitamin  (MULTIVITAMIN WITH MINERALS) TABS tablet Take 1 tablet by mouth daily.    . TRAVATAN Z 0.004 % ophthalmic solution Place 1 drop into both eyes at bedtime.      No current facility-administered medications on file prior to visit.     Review of Systems  Constitutional: Negative for other unusual diaphoresis or sweats HENT: Negative for ear discharge or swelling Eyes: Negative for other worsening visual disturbances Respiratory: Negative for stridor or other swelling  Gastrointestinal: Negative for worsening distension or other blood Genitourinary: Negative for retention or other urinary change Musculoskeletal: Negative for other MSK pain or swelling Skin: Negative for color change or other new lesions Neurological: Negative for worsening tremors and other numbness  Psychiatric/Behavioral: Negative for worsening agitation or other fatigue All other system neg per pt    Objective:   Physical Exam BP 128/72   Pulse 63   Ht 6\' 3"  (1.905 m)   Wt 290 lb (131.5 kg)   SpO2 98%   BMI 36.25 kg/m  VS noted,  Constitutional: Pt appears in NAD HENT: Head: NCAT.  Right Ear: External ear normal.  Left Ear: External ear normal.  Eyes: . Pupils are equal, round, and reactive to light. Conjunctivae and EOM are normal Nose: without d/c or deformity Neck: Neck supple. Gross normal ROM Cardiovascular: Normal rate and regular rhythm.   Pulmonary/Chest: Effort normal and breath sounds without rales or wheezing.  Abd:  Soft, NT, ND, + BS, no organomegaly Neurological: Pt is alert. At baseline orientation, motor grossly intact Skin: Skin is warm. No rashes, other new lesions, no LE edema Psychiatric: Pt behavior is normal without agitation  No other exam findings Lab Results  Component Value Date   WBC 4.5 03/12/2017   HGB 13.7 03/12/2017   HCT 40.6 03/12/2017   PLT 202 03/12/2017   GLUCOSE 153 (H) 03/12/2017   CHOL 171 11/09/2016   TRIG 91.0 11/09/2016   HDL 45.90 11/09/2016   LDLCALC 107  (H) 11/09/2016   ALT 21 03/12/2017   AST 33 03/12/2017   NA 139 03/12/2017   K 3.9 03/12/2017   CL 103 03/12/2017   CREATININE 1.14 03/12/2017   BUN 9 03/12/2017   CO2 28 03/12/2017   TSH 1.26 11/09/2016   PSA 1.13 05/13/2015   HGBA1C 5.5 11/09/2016       Assessment & Plan:

## 2017-05-10 NOTE — Telephone Encounter (Signed)
Pt has been informed and expressed understanding.  

## 2017-05-10 NOTE — Assessment & Plan Note (Signed)
stable overall by history and exam, recent data reviewed with pt, and pt to continue medical treatment as before,  to f/u any worsening symptoms or concerns Lab Results  Component Value Date   LDLCALC 107 (H) 11/09/2016   Goal < 70, for f/u lab today

## 2017-05-10 NOTE — Assessment & Plan Note (Signed)
stable overall by history and exam, recent data reviewed with pt, and pt to continue medical treatment as before,  to f/u any worsening symptoms or concerns Lab Results  Component Value Date   HGBA1C 5.5 11/09/2016  to cont to work on wt loss with activity and diet

## 2017-05-10 NOTE — Assessment & Plan Note (Signed)
stable overall by history and exam, recent data reviewed with pt, and pt to continue medical treatment as before,  to f/u any worsening symptoms or concerns BP Readings from Last 3 Encounters:  05/10/17 128/72  03/13/17 139/82  03/03/17 (!) 180/88

## 2017-05-10 NOTE — Telephone Encounter (Signed)
-----   Message from Biagio Borg, MD sent at 05/10/2017 12:42 PM EDT ----- Left message on MyChart, pt to cont same tx except  The test results show that your current treatment is OK, except the LDL cholesterol continues to increase. Please follow a lower cholesterol diet, and also start generic Crestor 10 mg per day, as this will help reduce your future risk of heart attack and stroke.Redmond Baseman to please inform pt, I will do rx

## 2017-05-18 DIAGNOSIS — H401133 Primary open-angle glaucoma, bilateral, severe stage: Secondary | ICD-10-CM | POA: Diagnosis not present

## 2017-05-18 DIAGNOSIS — H2511 Age-related nuclear cataract, right eye: Secondary | ICD-10-CM | POA: Diagnosis not present

## 2017-05-18 DIAGNOSIS — H31012 Macula scars of posterior pole (postinflammatory) (post-traumatic), left eye: Secondary | ICD-10-CM | POA: Diagnosis not present

## 2017-05-18 DIAGNOSIS — Z961 Presence of intraocular lens: Secondary | ICD-10-CM | POA: Diagnosis not present

## 2017-08-23 ENCOUNTER — Other Ambulatory Visit (INDEPENDENT_AMBULATORY_CARE_PROVIDER_SITE_OTHER): Payer: Medicare Other

## 2017-08-23 ENCOUNTER — Ambulatory Visit (INDEPENDENT_AMBULATORY_CARE_PROVIDER_SITE_OTHER): Payer: Medicare Other | Admitting: Internal Medicine

## 2017-08-23 ENCOUNTER — Encounter: Payer: Self-pay | Admitting: Internal Medicine

## 2017-08-23 VITALS — BP 132/84 | HR 73 | Temp 98.3°F | Ht 75.0 in | Wt 293.0 lb

## 2017-08-23 DIAGNOSIS — I5020 Unspecified systolic (congestive) heart failure: Secondary | ICD-10-CM

## 2017-08-23 DIAGNOSIS — R062 Wheezing: Secondary | ICD-10-CM

## 2017-08-23 DIAGNOSIS — R8290 Unspecified abnormal findings in urine: Secondary | ICD-10-CM

## 2017-08-23 DIAGNOSIS — R059 Cough, unspecified: Secondary | ICD-10-CM

## 2017-08-23 DIAGNOSIS — R05 Cough: Secondary | ICD-10-CM | POA: Diagnosis not present

## 2017-08-23 LAB — URINALYSIS, ROUTINE W REFLEX MICROSCOPIC
Bilirubin Urine: NEGATIVE
Hgb urine dipstick: NEGATIVE
Leukocytes, UA: NEGATIVE
NITRITE: NEGATIVE
SPECIFIC GRAVITY, URINE: 1.02 (ref 1.000–1.030)
TOTAL PROTEIN, URINE-UPE24: NEGATIVE
URINE GLUCOSE: NEGATIVE
Urobilinogen, UA: 2 — AB (ref 0.0–1.0)
WBC, UA: NONE SEEN (ref 0–?)
pH: 6 (ref 5.0–8.0)

## 2017-08-23 MED ORDER — PREDNISONE 10 MG PO TABS
ORAL_TABLET | ORAL | 0 refills | Status: DC
Start: 2017-08-23 — End: 2017-11-09

## 2017-08-23 MED ORDER — HYDROCODONE-HOMATROPINE 5-1.5 MG/5ML PO SYRP: 5.0000 mL | ORAL_SOLUTION | Freq: Four times a day (QID) | ORAL | 0 refills | Status: AC | PRN

## 2017-08-23 MED ORDER — CEPHALEXIN 500 MG PO CAPS: 500.0000 mg | ORAL_CAPSULE | Freq: Three times a day (TID) | ORAL | 0 refills | Status: AC

## 2017-08-23 NOTE — Assessment & Plan Note (Signed)
Etiology unclear, could just be concentrated urine, for urine studies,  to f/u any worsening symptoms or concerns

## 2017-08-23 NOTE — Assessment & Plan Note (Signed)
Has bilat pedal edema in the past wk, I suspect related to pulm dz, will cont current meds and consider further diuretic for worsening

## 2017-08-23 NOTE — Progress Notes (Signed)
Subjective:    Patient ID: Dylan Benton, male    DOB: 1941-03-14, 76 y.o.   MRN: [id number]  HPI  Here with acute onset mild to mod 2-3 days ST, HA, general weakness and malaise, with prod cough greenish sputum, but Pt denies chest pain, increased sob or doe, wheezing, orthopnea, PND, increased LE swelling, palpitations, dizziness or syncope, except for onset mild sob and wheezing,  Also urine is :strong" odor recently for unclear reason, but denies urinary symptoms such as dysuria, frequency, urgency, flank pain, hematuria or n/v, fever, chills.  Has bilat pedal edema come up in the past wk as well, but Pt denies chest pain, orthopnea, PND, increased LE swelling, palpitations, dizziness or syncope. Past Medical History:  Diagnosis Date  . Allergy   . Anemia, unspecified    NOS ?resolved  . Anxiety   . Aortic root dilation (HCC)   . Arthritis   . ASTHMA, UNSPECIFIED, UNSPECIFIED STATUS 08/06/2009   Annotation: No exacerbations.  Qualifier: Diagnosis of  By: Kelton Pillar MD, [place]    . Bladder calculus 05/05/2012  . Cataract   . Chest pain   . ECHOCARDIOGRAM, ABNORMAL 12/26/2006    Aortic root dilation  This problem surfaced in 2006 after a cardiology referral for chest pain.  He was seen by Dr. Haroldine Laws in January o6 and cathed on Jan. 18.  Coronaries were nl and EF was 65%.  Echo showed mild aortic root dilation of 42mm. He was started on metoprolol (although I notice that he is no longer on this.  Annual echo to follow aortic root was advised.  Aortic root dimension was unchanged on studies in 2009 and 2010.  Will continue to follow this, perhaps not every year as it seems stable.   . Erectile dysfunction 05/05/2012  . GERD (gastroesophageal reflux disease) 05/05/2012  . H pylori ulcer    teated H pylori  . History of back surgery   . Hyperlipidemia 05/08/2012  . Hypertension   . Impaired glucose tolerance 05/05/2012  . Lumbar disc disease 05/08/2012  . Male stress incontinence 05/05/2012  .  Myocardial infarction [facility])    pt states, "i've been told I have had two heart attacks  . Obesity   . OBESITY 10/06/2006   Qualifier: Diagnosis of  By: Stann Mainland MD, Nicole Kindred    . Prostate cancer ([place])    Hx of   . PROSTATE CANCER, HX OF 08/06/2009   Annotation: prostatectomy and radiotherapy in 1990s,  Dr. Jeffie Pollock Qualifier: Diagnosis of  By: Kelton Pillar MD, [place]    . PUD (peptic ulcer disease) 05/05/2012  . Systolic CHF ([place]) 4/58/0998  . Thoracic aortic aneurysm ([place])   . 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 Edilia Bo Qualifier: Diagnosis of  By: Kelton Pillar MD, [place]     Past Surgical History:  Procedure Laterality Date  . BACK SURGERY    . ear surgury    . eye surgury    . PROSTATECTOMY      reports that he quit smoking about 28 years ago. He has never used smokeless tobacco. He reports that he does not drink alcohol or use drugs. family history includes Coronary artery disease in his unknown relative; Hypertension in his unknown relative; Stroke in his unknown relative. No Known Allergies Current Outpatient Prescriptions on File Prior to Visit  Medication Sig Dispense Refill  . albuterol (PROVENTIL HFA;VENTOLIN HFA) 108 (90 Base) MCG/ACT inhaler Inhale 1-2 puffs into the lungs every 6 (six) hours as  needed for wheezing or shortness of breath.    . COMBIGAN 0.2-0.5 % ophthalmic solution Take 1 drop by mouth every 12 (twelve) hours.     . furosemide (LASIX) 40 MG tablet TAKE 1 TABLET BY MOUTH DAILY 90 tablet 3  . meloxicam (MOBIC) 7.5 MG tablet Take 1 tablet (7.5 mg total) by mouth daily. (Patient taking differently: Take 7.5 mg by mouth daily as needed. ) 90 tablet 1  . Multiple Vitamin (MULTIVITAMIN WITH MINERALS) TABS tablet Take 1 tablet by mouth daily.    . rosuvastatin (CRESTOR) 10 MG tablet Take 1 tablet (10 mg total) by mouth daily. 90 tablet 3  . TRAVATAN Z 0.004 % ophthalmic solution Place 1 drop into both eyes at bedtime.      No current  facility-administered medications on file prior to visit.    Review of Systems  Constitutional: Negative for other unusual diaphoresis or sweats HENT: Negative for ear discharge or swelling Eyes: Negative for other worsening visual disturbances Respiratory: Negative for stridor or other swelling  Gastrointestinal: Negative for worsening distension or other blood Genitourinary: Negative for retention or other urinary change Musculoskeletal: Negative for other MSK pain or swelling Skin: Negative for color change or other new lesions Neurological: Negative for worsening tremors and other numbness  Psychiatric/Behavioral: Negative for worsening agitation or other fatigue All other system neg per pt    Objective:   Physical Exam BP 132/84   Pulse 73   Temp 98.3 F (36.8 C) (Oral)   Ht 6\' 3"  (1.905 m)   Wt 293 lb (132.9 kg)   SpO2 98%   BMI 36.62 kg/m  VS noted, mild ill Constitutional: Pt appears in NAD HENT: Head: NCAT.  Right Ear: External ear normal.  Left Ear: External ear normal.  Eyes: . Pupils are equal, round, and reactive to light. Conjunctivae and EOM are normal Bilat tm's with mild erythema.  Max sinus areas non tender.  Pharynx with mild erythema, no exudate Nose: without d/c or deformity Neck: Neck supple. Gross normal ROM Cardiovascular: Normal rate and regular rhythm.   Pulmonary/Chest: Effort normal and breath sounds decreased without rales but with few scattered wheezing.  Neurological: Pt is alert. At baseline orientation, motor grossly intact Skin: Skin is warm. No rashes, other new lesions, has tracel bilat ankle LE edema Psychiatric: Pt behavior is normal without agitation  No other exam findings Lab Results  Component Value Date   WBC 4.5 03/12/2017   HGB 13.7 03/12/2017   HCT 40.6 03/12/2017   PLT 202 03/12/2017   GLUCOSE 138 (H) 05/10/2017   CHOL 183 05/10/2017   TRIG 79.0 05/10/2017   HDL 47.80 05/10/2017   LDLCALC 119 (H) 05/10/2017   ALT 13  05/10/2017   AST 16 05/10/2017   NA 139 05/10/2017   K 4.2 05/10/2017   CL 101 05/10/2017   CREATININE 1.04 05/10/2017   BUN 12 05/10/2017   CO2 33 (H) 05/10/2017   TSH 1.26 11/09/2016   PSA 1.13 05/13/2015   HGBA1C 5.9 05/10/2017       Assessment & Plan:

## 2017-08-23 NOTE — Assessment & Plan Note (Signed)
Mild to mod, c/w bronchitis and pna, declines cxr, for antibx course, cough med prn, to f/u any worsening symptoms or concerns

## 2017-08-23 NOTE — Assessment & Plan Note (Signed)
/  Mild to mod, for predpac asd,  to f/u any worsening symptoms or concerns 

## 2017-08-23 NOTE — Patient Instructions (Signed)
Please take all new medication as prescribed - the antibiotic, cough medicine, and prednisone  Please continue all other medications as before, and refills have been done if requested.  Please have the pharmacy call with any other refills you may need.  Please keep your appointments with your specialists as you may have planned  Please go to the LAB in the Basement (turn left off the elevator) for the tests to be done today - just the urine testing today  You will be contacted by phone if any changes need to be made immediately.  Otherwise, you will receive a letter about your results with an explanation, but please check with MyChart first.  Please remember to sign up for MyChart if you have not done so, as this will be important to you in the future with finding out test results, communicating by private email, and scheduling acute appointments online when needed.

## 2017-08-24 LAB — URINE CULTURE
MICRO NUMBER: 81092104
Result:: NO GROWTH
SPECIMEN QUALITY: ADEQUATE

## 2017-10-24 DIAGNOSIS — H401133 Primary open-angle glaucoma, bilateral, severe stage: Secondary | ICD-10-CM | POA: Diagnosis not present

## 2017-10-26 NOTE — Telephone Encounter (Signed)
error 

## 2017-11-09 ENCOUNTER — Ambulatory Visit (INDEPENDENT_AMBULATORY_CARE_PROVIDER_SITE_OTHER): Payer: Medicare Other | Admitting: Internal Medicine

## 2017-11-09 ENCOUNTER — Other Ambulatory Visit (INDEPENDENT_AMBULATORY_CARE_PROVIDER_SITE_OTHER): Payer: Medicare Other

## 2017-11-09 VITALS — BP 128/82 | HR 63 | Temp 98.0°F | Ht 75.0 in | Wt 292.0 lb

## 2017-11-09 DIAGNOSIS — Z23 Encounter for immunization: Secondary | ICD-10-CM | POA: Diagnosis not present

## 2017-11-09 DIAGNOSIS — I10 Essential (primary) hypertension: Secondary | ICD-10-CM | POA: Diagnosis not present

## 2017-11-09 DIAGNOSIS — E785 Hyperlipidemia, unspecified: Secondary | ICD-10-CM | POA: Diagnosis not present

## 2017-11-09 DIAGNOSIS — N320 Bladder-neck obstruction: Secondary | ICD-10-CM

## 2017-11-09 DIAGNOSIS — R7302 Impaired glucose tolerance (oral): Secondary | ICD-10-CM

## 2017-11-09 LAB — BASIC METABOLIC PANEL
BUN: 9 mg/dL (ref 6–23)
CALCIUM: 9 mg/dL (ref 8.4–10.5)
CO2: 33 mEq/L — ABNORMAL HIGH (ref 19–32)
CREATININE: 0.94 mg/dL (ref 0.40–1.50)
Chloride: 101 mEq/L (ref 96–112)
GFR: 99.63 mL/min (ref >60.00)
GLUCOSE: 129 mg/dL — AB (ref 70–99)
Potassium: 4.2 mEq/L (ref 3.5–5.1)
Sodium: 139 mEq/L (ref 135–145)

## 2017-11-09 LAB — URINALYSIS, ROUTINE W REFLEX MICROSCOPIC
Bilirubin Urine: NEGATIVE
HGB URINE DIPSTICK: NEGATIVE
Ketones, ur: NEGATIVE
NITRITE: POSITIVE — AB
SPECIFIC GRAVITY, URINE: 1.025 (ref 1.000–1.030)
Total Protein, Urine: NEGATIVE
Urine Glucose: NEGATIVE
Urobilinogen, UA: 1 (ref 0.0–1.0)
pH: 6 (ref 5.0–8.0)

## 2017-11-09 LAB — HEPATIC FUNCTION PANEL
ALK PHOS: 73 U/L (ref 39–117)
ALT: 12 U/L (ref 0–53)
AST: 18 U/L (ref 0–37)
Albumin: 4.2 g/dL (ref 3.5–5.2)
BILIRUBIN DIRECT: 0.2 mg/dL (ref 0.0–0.3)
BILIRUBIN TOTAL: 0.8 mg/dL (ref 0.2–1.2)
Total Protein: 6.8 g/dL (ref 6.0–8.3)

## 2017-11-09 LAB — CBC WITH DIFFERENTIAL/PLATELET
BASOS ABS: 0 10*3/uL (ref 0.0–0.1)
Basophils Relative: 0.9 % (ref 0.0–3.0)
EOS ABS: 0.1 10*3/uL (ref 0.0–0.7)
EOS PCT: 1.5 % (ref 0.0–5.0)
HCT: 43.7 % (ref 39.0–52.0)
HEMOGLOBIN: 14.4 g/dL (ref 13.0–17.0)
Lymphocytes Relative: 37.5 % (ref 12.0–46.0)
Lymphs Abs: 1.6 10*3/uL (ref 0.7–4.0)
MCHC: 32.9 g/dL (ref 30.0–36.0)
MCV: 96.1 fl (ref 78.0–100.0)
Monocytes Absolute: 0.5 10*3/uL (ref 0.1–1.0)
Monocytes Relative: 13.1 % — ABNORMAL HIGH (ref 3.0–12.0)
Neutro Abs: 2 10*3/uL (ref 1.4–7.7)
Neutrophils Relative %: 47 % (ref 43.0–77.0)
Platelets: 143 10*3/uL — ABNORMAL LOW (ref 150.0–400.0)
RBC: 4.54 Mil/uL (ref 4.22–5.81)
RDW: 12.7 % (ref 11.5–15.5)
WBC: 4.2 10*3/uL (ref 4.0–10.5)

## 2017-11-09 LAB — LIPID PANEL
CHOL/HDL RATIO: 2
Cholesterol: 104 mg/dL (ref 0–200)
HDL: 44.8 mg/dL (ref >39.00)
LDL Cholesterol: 47 mg/dL (ref 0–99)
NONHDL: 58.91
Triglycerides: 58 mg/dL (ref 0.0–149.0)
VLDL: 11.6 mg/dL (ref 0.0–40.0)

## 2017-11-09 LAB — PSA: PSA: 1.65 ng/mL (ref 0.10–4.00)

## 2017-11-09 LAB — HEMOGLOBIN A1C: HEMOGLOBIN A1C: 6.2 % (ref 4.6–6.5)

## 2017-11-09 LAB — TSH: TSH: 1.89 u[IU]/mL (ref 0.35–4.50)

## 2017-11-09 NOTE — Assessment & Plan Note (Signed)
stable overall by history and exam, recent data reviewed with pt, and pt to continue medical treatment as before,  to f/u any worsening symptoms or concerns  

## 2017-11-09 NOTE — Assessment & Plan Note (Signed)
Tolerating new statin farily well, ok for f/u lab, cont med and diet, goal LDL < 70

## 2017-11-09 NOTE — Assessment & Plan Note (Signed)
stable overall by history and exam, recent data reviewed with pt, and pt to continue medical treatment as before,  to f/u any worsening symptoms or concerns Lab Results  Component Value Date   HGBA1C 5.9 05/10/2017

## 2017-11-09 NOTE — Progress Notes (Signed)
Subjective:    Patient ID: Dylan Benton, male    DOB: 06-May-1941, 76 y.o.   MRN: [id number]  HPI Here for yearly f/u;  Overall doing ok;  Pt denies Chest pain, worsening SOB, DOE, wheezing, orthopnea, PND, worsening LE edema, palpitations, dizziness or syncope.  Pt denies neurological change such as new headache, facial or extremity weakness.  Pt denies polydipsia, polyuria, or low sugar symptoms. Pt states overall good compliance with treatment and medications, good tolerability, and has been trying to follow appropriate diet.  Pt denies worsening depressive symptoms, suicidal ideation or panic. No fever, night sweats, wt loss, loss of appetite, or other constitutional symptoms.  Pt states good ability with ADL's, has low fall risk, home safety reviewed and adequate, no other significant changes in hearing or vision, and only occasionally active with exercise  Has some general weakness on the crestor but willing to take further.    Pt continues to have recurring LBP, bowel or bladder change, fever, wt loss,  worsening LE pain/numbness/weakness, gait change or falls, but had recent LS spine per pt at [facility], given 50% disability immediately and asked to return if he wanted what sounds like nerve ablation tx.  Pt p.ans to take the MRI to his [facility].   Takes the meloxicam prn, thinks he may be getting dizzy from it sometimes.  Declines flu shot, ok for pneumovax. No other new complaints Past Medical History:  Diagnosis Date  . Allergy   . Anemia, unspecified    NOS ?resolved  . Anxiety   . Aortic root dilation (HCC)   . Arthritis   . ASTHMA, UNSPECIFIED, UNSPECIFIED STATUS 08/06/2009   Annotation: No exacerbations.  Qualifier: Diagnosis of  By: Kelton Pillar MD, [place]    . Bladder calculus 05/05/2012  . Cataract   . Chest pain   . ECHOCARDIOGRAM, ABNORMAL 12/26/2006    Aortic root dilation  This problem surfaced in 2006 after a cardiology referral for chest pain.  He was seen by Dr. Haroldine Laws in  January o6 and cathed on Jan. 18.  Coronaries were nl and EF was 65%.  Echo showed mild aortic root dilation of 48mm. He was started on metoprolol (although I notice that he is no longer on this.  Annual echo to follow aortic root was advised.  Aortic root dimension was unchanged on studies in 2009 and 2010.  Will continue to follow this, perhaps not every year as it seems stable.   . Erectile dysfunction 05/05/2012  . GERD (gastroesophageal reflux disease) 05/05/2012  . H pylori ulcer    teated H pylori  . History of back surgery   . Hyperlipidemia 05/08/2012  . Hypertension   . Impaired glucose tolerance 05/05/2012  . Lumbar disc disease 05/08/2012  . Male stress incontinence 05/05/2012  . Myocardial infarction [facility])    pt states, "i've been told I have had two heart attacks  . Obesity   . OBESITY 10/06/2006   Qualifier: Diagnosis of  By: Stann Mainland MD, Nicole Kindred    . Prostate cancer ([place])    Hx of   . PROSTATE CANCER, HX OF 08/06/2009   Annotation: prostatectomy and radiotherapy in 1990s,  Dr. Jeffie Pollock Qualifier: Diagnosis of  By: Kelton Pillar MD, [place]    . PUD (peptic ulcer disease) 05/05/2012  . Systolic CHF ([place]) 3/81/0175  . Thoracic aortic aneurysm ([place])   . 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 Edilia Bo Qualifier: Diagnosis of  By: Kelton Pillar MD, [place]iana Gerold  Past Surgical History:  Procedure Laterality Date  . BACK SURGERY    . ear surgury    . eye surgury    . PROSTATECTOMY      reports that he quit smoking about 28 years ago. he has never used smokeless tobacco. He reports that he does not drink alcohol or use drugs. family history includes Coronary artery disease in his unknown relative; Hypertension in his unknown relative; Stroke in his unknown relative. No Known Allergies Current Outpatient Medications on File Prior to Visit  Medication Sig Dispense Refill  . albuterol (PROVENTIL HFA;VENTOLIN HFA) 108 (90 Base) MCG/ACT inhaler Inhale 1-2 puffs  into the lungs every 6 (six) hours as needed for wheezing or shortness of breath.    . COMBIGAN 0.2-0.5 % ophthalmic solution Take 1 drop by mouth every 12 (twelve) hours.     . furosemide (LASIX) 40 MG tablet TAKE 1 TABLET BY MOUTH DAILY 90 tablet 3  . meloxicam (MOBIC) 7.5 MG tablet Take 1 tablet (7.5 mg total) by mouth daily. (Patient taking differently: Take 7.5 mg by mouth daily as needed. ) 90 tablet 1  . Multiple Vitamin (MULTIVITAMIN WITH MINERALS) TABS tablet Take 1 tablet by mouth daily.    . rosuvastatin (CRESTOR) 10 MG tablet Take 1 tablet (10 mg total) by mouth daily. 90 tablet 3  . TRAVATAN Z 0.004 % ophthalmic solution Place 1 drop into both eyes at bedtime.      No current facility-administered medications on file prior to visit.     Review of Systems Constitutional: Negative for other unusual diaphoresis, sweats, appetite or weight changes HENT: Negative for other worsening hearing loss, ear pain, facial swelling, mouth sores or neck stiffness.   Eyes: Negative for other worsening pain, redness or other visual disturbance.  Respiratory: Negative for other stridor or swelling Cardiovascular: Negative for other palpitations or other chest pain  Gastrointestinal: Negative for worsening diarrhea or loose stools, blood in stool, distention or other pain Genitourinary: Negative for hematuria, flank pain or other change in urine volume.  Musculoskeletal: Negative for myalgias or other joint swelling.  Skin: Negative for other color change, or other wound or worsening drainage.  Neurological: Negative for other syncope or numbness. Hematological: Negative for other adenopathy or swelling Psychiatric/Behavioral: Negative for hallucinations, other worsening agitation, SI, self-injury, or new decreased concentration All other system neg per pt    Objective:   Physical Exam BP 128/82   Pulse 63   Temp 98 F (36.7 C) (Oral)   Ht 6\' 3"  (1.905 m)   Wt 292 lb (132.5 kg)   SpO2 99%    BMI 36.50 kg/m  VS noted, obese Constitutional: Pt is oriented to person, place, and time. Appears well-developed and well-nourished, in no significant distress and comfortable Head: Normocephalic and atraumatic  Eyes: Conjunctivae and EOM are normal. Pupils are equal, round, and reactive to light Right Ear: External ear normal without discharge Left Ear: External ear normal without discharge Nose: Nose without discharge or deformity Mouth/Throat: Oropharynx is without other ulcerations and moist  Neck: Normal range of motion. Neck supple. No JVD present. No tracheal deviation present or significant neck LA or mass Cardiovascular: Normal rate, regular rhythm, normal heart sounds and intact distal pulses.   Pulmonary/Chest: WOB normal and breath sounds without rales or wheezing  Abdominal: Soft. Bowel sounds are normal. NT. No HSM  Musculoskeletal: Normal range of motion. Exhibits no edema Lymphadenopathy: Has no other cervical adenopathy.  Neurological: Pt is alert and oriented  to person, place, and time. Pt has normal reflexes. No cranial nerve deficit. Motor grossly intact, Gait intact Skin: Skin is warm and dry. No rash noted or new ulcerations Psychiatric:  Has normal mood and affect. Behavior is normal without agitation No other exam findings Lab Results  Component Value Date   WBC 4.5 03/12/2017   HGB 13.7 03/12/2017   HCT 40.6 03/12/2017   PLT 202 03/12/2017   GLUCOSE 138 (H) 05/10/2017   CHOL 183 05/10/2017   TRIG 79.0 05/10/2017   HDL 47.80 05/10/2017   LDLCALC 119 (H) 05/10/2017   ALT 13 05/10/2017   AST 16 05/10/2017   NA 139 05/10/2017   K 4.2 05/10/2017   CL 101 05/10/2017   CREATININE 1.04 05/10/2017   BUN 12 05/10/2017   CO2 33 (H) 05/10/2017   TSH 1.26 11/09/2016   PSA 1.13 05/13/2015   HGBA1C 5.9 05/10/2017          Assessment & Plan:

## 2017-11-09 NOTE — Patient Instructions (Addendum)
You had the Pneumovax pneumonia shot today  Please continue all other medications as before, and refills have been done if requested.  Please have the pharmacy call with any other refills you may need.  Please continue your efforts at being more active, low cholesterol diet, and weight control.  You are otherwise up to date with prevention measures today.  Please keep your appointments with your specialists as you may have planned  Please go to the LAB in the Basement (turn left off the elevator) for the tests to be done today  You will be contacted by phone if any changes need to be made immediately.  Otherwise, you will receive a letter about your results with an explanation, but please check with MyChart first.  Please remember to sign up for MyChart if you have not done so, as this will be important to you in the future with finding out test results, communicating by private email, and scheduling acute appointments online when needed.  Please return in 6 months, or sooner if needed 

## 2017-11-09 NOTE — Addendum Note (Signed)
Addended by: Biagio Borg on: 11/09/2017 09:39 AM   Modules accepted: Orders

## 2017-11-10 ENCOUNTER — Telehealth: Payer: Self-pay | Admitting: Internal Medicine

## 2017-11-10 MED ORDER — ALBUTEROL SULFATE HFA 108 (90 BASE) MCG/ACT IN AERS: 2.0000 | INHALATION_SPRAY | Freq: Four times a day (QID) | RESPIRATORY_TRACT | 5 refills | Status: DC | PRN

## 2017-11-10 NOTE — Telephone Encounter (Signed)
Notified pt rx has been sent to your local pharmacy.../lmb  

## 2017-11-10 NOTE — Telephone Encounter (Signed)
Done erx 

## 2017-11-10 NOTE — Telephone Encounter (Signed)
Never prescribed by Dr. Jenny Reichmann please advise

## 2017-11-10 NOTE — Telephone Encounter (Signed)
Copied from [place]. Topic: Quick Communication - See Telephone Encounter >> Nov 10, 2017  8:39 AM Aurelio Brash B wrote: CRM for notification. See Telephone encounter for:  Refill albuterol inhaler   [facility]   PT was in office yesterday, said pharm  denied refill 11/10/17.  Pt is out needs asap

## 2017-11-14 ENCOUNTER — Ambulatory Visit (INDEPENDENT_AMBULATORY_CARE_PROVIDER_SITE_OTHER): Payer: Medicare Other | Admitting: Internal Medicine

## 2017-11-14 ENCOUNTER — Encounter: Payer: Self-pay | Admitting: Internal Medicine

## 2017-11-14 VITALS — BP 130/60 | HR 81 | Temp 98.3°F | Wt 293.4 lb

## 2017-11-14 DIAGNOSIS — R058 Other specified cough: Secondary | ICD-10-CM

## 2017-11-14 DIAGNOSIS — R05 Cough: Secondary | ICD-10-CM | POA: Diagnosis not present

## 2017-11-14 DIAGNOSIS — I509 Heart failure, unspecified: Secondary | ICD-10-CM

## 2017-11-14 DIAGNOSIS — Z8709 Personal history of other diseases of the respiratory system: Secondary | ICD-10-CM | POA: Diagnosis not present

## 2017-11-14 MED ORDER — DOXYCYCLINE HYCLATE 100 MG PO TABS: 100.0000 mg | ORAL_TABLET | Freq: Two times a day (BID) | ORAL | 0 refills | Status: DC

## 2017-11-14 MED ORDER — PREDNISONE 20 MG PO TABS: 20.0000 mg | ORAL_TABLET | Freq: Two times a day (BID) | ORAL | 0 refills | Status: DC

## 2017-11-14 NOTE — Patient Instructions (Addendum)
Get chest x ray .  Today .  To check for fluid  Or unexpected findings on  the lungs .      Add antibiotic and  Prednisone .   As we discussed   Plan fu with Dr Jenny Reichmann if not getting better next week or if worse   Wt Readings from Last 3 Encounters:  11/14/17 293 lb 6.4 oz (133.1 kg)  11/09/17 292 lb (132.5 kg)  08/23/17 293 lb (132.9 kg)

## 2017-11-14 NOTE — Progress Notes (Signed)
Chief Complaint  Patient presents with  . Cough    HPI: Dylan Benton 76 y.o.  sda PCP NA   Had sx onset about the time that he came in for his last visit with Dr. Jenny Reichmann wife said it could have been from from pna 85.   He states that he is having problems with the symptoms off and on for almost a year.  However on further questioning these have been episodic.  Had infection in April  Viral and  Bacterial infection .   Has asthma   Has inhaler  .   Last  Times  Prednisone.   And antibiotic.  When he had similar symptoms.  ? No fever  Tylenol cold . advil for symptoms relief. There is no hemoptysis.  Thinks he could have some fluid in his lung and over the last couple months feels that he has gained weight and has some fluid retention.  There is no increasing PND.  Real change in exercise tolerance however. No bleeding falling fainting.  He is followed by cardiology for CHF and dilated aortic root.  Is due for follow-up imaging. ROS: See pertinent positives and negatives per HPI.  Past Medical History:  Diagnosis Date  . Allergy   . Anemia, unspecified    NOS ?resolved  . Anxiety   . Aortic root dilation (HCC)   . Arthritis   . ASTHMA, UNSPECIFIED, UNSPECIFIED STATUS 08/06/2009   Annotation: No exacerbations.  Qualifier: Diagnosis of  By: Kelton Pillar MD, [place]    . Bladder calculus 05/05/2012  . Cataract   . Chest pain   . ECHOCARDIOGRAM, ABNORMAL 12/26/2006    Aortic root dilation  This problem surfaced in 2006 after a cardiology referral for chest pain.  He was seen by Dr. Haroldine Laws in January o6 and cathed on Jan. 18.  Coronaries were nl and EF was 65%.  Echo showed mild aortic root dilation of 45mm. He was started on metoprolol (although I notice that he is no longer on this.  Annual echo to follow aortic root was advised.  Aortic root dimension was unchanged on studies in 2009 and 2010.  Will continue to follow this, perhaps not every year as it seems stable.   . Erectile dysfunction  05/05/2012  . GERD (gastroesophageal reflux disease) 05/05/2012  . H pylori ulcer    teated H pylori  . History of back surgery   . Hyperlipidemia 05/08/2012  . Hypertension   . Impaired glucose tolerance 05/05/2012  . Lumbar disc disease 05/08/2012  . Male stress incontinence 05/05/2012  . Myocardial infarction [facility])    pt states, "i've been told I have had two heart attacks  . Obesity   . OBESITY 10/06/2006   Qualifier: Diagnosis of  By: Stann Mainland MD, Nicole Kindred    . Prostate cancer ([place])    Hx of   . PROSTATE CANCER, HX OF 08/06/2009   Annotation: prostatectomy and radiotherapy in 1990s,  Dr. Jeffie Pollock Qualifier: Diagnosis of  By: Kelton Pillar MD, [place]    . PUD (peptic ulcer disease) 05/05/2012  . Systolic CHF ([place]) 6/43/3295  . Thoracic aortic aneurysm ([place])   . 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 Edilia Bo Qualifier: Diagnosis of  By: Kelton Pillar MD, [place]      Family History  Problem Relation Age of Onset  . Coronary artery disease Unknown   . Hypertension Unknown   . Stroke Unknown   . Colon cancer Neg Hx   .  Esophageal cancer Neg Hx   . Rectal cancer Neg Hx   . Stomach cancer Neg Hx     Social History   Socioeconomic History  . Marital status: Married    Spouse name: None  . Number of children: 2  . Years of education: 69  . Highest education level: None  Social Needs  . Financial resource strain: None  . Food insecurity - worry: None  . Food insecurity - inability: None  . Transportation needs - medical: None  . Transportation needs - non-medical: None  Occupational History  . Occupation: Camera operator (Part-time)  Tobacco Use  . Smoking status: Former Smoker    Last attempt to quit: 12/02/1988    Years since quitting: 28.9  . Smokeless tobacco: Never Used  Substance and Sexual Activity  . Alcohol use: No  . Drug use: No  . Sexual activity: None  Other Topics Concern  . None  Social History Narrative   Works for [employer] at  [employer] in [place].    Outpatient Medications Prior to Visit  Medication Sig Dispense Refill  . albuterol (PROVENTIL HFA;VENTOLIN HFA) 108 (90 Base) MCG/ACT inhaler Inhale 2 puffs into the lungs every 6 (six) hours as needed for wheezing or shortness of breath. 18 g 5  . COMBIGAN 0.2-0.5 % ophthalmic solution Take 1 drop by mouth every 12 (twelve) hours.     . furosemide (LASIX) 40 MG tablet TAKE 1 TABLET BY MOUTH DAILY 90 tablet 3  . meloxicam (MOBIC) 7.5 MG tablet Take 1 tablet (7.5 mg total) by mouth daily. (Patient taking differently: Take 7.5 mg by mouth daily as needed. ) 90 tablet 1  . Multiple Vitamin (MULTIVITAMIN WITH MINERALS) TABS tablet Take 1 tablet by mouth daily.    . rosuvastatin (CRESTOR) 10 MG tablet Take 1 tablet (10 mg total) by mouth daily. 90 tablet 3  . TRAVATAN Z 0.004 % ophthalmic solution Place 1 drop into both eyes at bedtime.      No facility-administered medications prior to visit.      EXAM:  BP 130/60 (BP Location: Left Arm, Cuff Size: Large)   Pulse 81   Temp 98.3 F (36.8 C) (Oral)   Wt 293 lb 6.4 oz (133.1 kg)   SpO2 97%   BMI 36.67 kg/m   Body mass index is 36.67 kg/m.  GENERAL: vitals reviewed and listed above, alert, oriented, appears well hydrated and in no acute distress cognition intact and normal interaction.  He appears nontoxic having intermittent coughing but no retractions or respiratory distress. HEENT: atraumatic, conjunctiva  clear, no obvious abnormalities on inspection of external nose and ears TMs show no acute findings OP : no lesion edema or exudate some postnasal drainage NECK: no obvious masses on inspection palpation  LUNGS: No rales or rhonchi question decreased breath sounds left base but present.  No acute wheezing.  CV: HRRR, no clubbing cyanosis 2+ peripheral edema nl cap refill  MS: moves all extremities without noticeable focal  abnormality PSYCH: pleasant and cooperative, no obvious depression or  anxiety  ASSESSMENT AND PLAN:  Discussed the following assessment and plan:  Respiratory tract congestion with cough - Plan: DG Chest 2 View  Chronic congestive heart failure, unspecified heart failure type ([place]) - Plan: DG Chest 2 View  History of asthma Possible respiratory infection whether there is secondary infection is unclear at this time however has risk with history of asthma by report and congestive heart failure by report.  He feels that he  could have something fluid in his left lung but he has no dramatic change in the last week.  Plan scheduled chest x-ray for that reason however all x-ray modalities will close at noon today and is unable to get it today so we will get it on 1226. In the meantime short course of prednisone empiric antibiotics risk-benefit about 50-50. We will seek care for decompensation acute distress. -Patient advised to return or notify health care team  if symptoms worsen ,persist or new concerns arise.  Patient Instructions   Get chest x ray .  Today .  To check for fluid  Or unexpected findings on  the lungs .      Add antibiotic and  Prednisone .   As we discussed   Plan fu with Dr Jenny Reichmann if not getting better next week or if worse   Wt Readings from Last 3 Encounters:  11/14/17 293 lb 6.4 oz (133.1 kg)  11/09/17 292 lb (132.5 kg)  08/23/17 293 lb (132.9 kg)       Standley Brooking. Panosh M.D.

## 2017-11-16 ENCOUNTER — Ambulatory Visit (INDEPENDENT_AMBULATORY_CARE_PROVIDER_SITE_OTHER)
Admission: RE | Admit: 2017-11-16 | Discharge: 2017-11-16 | Disposition: A | Discharge status: Home / Self Care | Payer: Medicare Other | Source: Ambulatory Visit | Attending: Internal Medicine | Admitting: Internal Medicine

## 2017-11-16 DIAGNOSIS — R05 Cough: Secondary | ICD-10-CM | POA: Diagnosis not present

## 2017-11-16 DIAGNOSIS — R0602 Shortness of breath: Secondary | ICD-10-CM | POA: Diagnosis not present

## 2017-11-16 DIAGNOSIS — I509 Heart failure, unspecified: Secondary | ICD-10-CM

## 2017-11-16 DIAGNOSIS — R058 Other specified cough: Secondary | ICD-10-CM

## 2017-11-23 ENCOUNTER — Telehealth: Payer: Self-pay | Admitting: Cardiovascular Disease

## 2017-11-23 NOTE — Telephone Encounter (Signed)
Pt c/o Shortness Of Breath: STAT if SOB developed within the last 24 hours or pt is noticeably SOB on the phone  1. Are you currently SOB (can you hear that pt is SOB on the phone)? no  2. How long have you been experiencing SOB? Since April   3. Are you SOB when sitting or when up moving around? n/a  4. Are you currently experiencing any other symptoms? Little tired    Patient is scheduled with Gwenlyn Found on 12-06-17 @ 3:45

## 2017-11-23 NOTE — Telephone Encounter (Signed)
Spoke with pt, he has been having problems for some time now. He reports currently he is having swelling in his feet and ankles, his weight is up and he is having SOB with exertion. He denies orthopnea. He has a follow up appointment 12-06-17. Advised the patient to take 80 mg of furosemide for the next 3 days and then reduce back to 40 mg once daily. She will call if symptoms worsen by the time of his appointment. Pt agreed with this plan.

## 2017-12-06 ENCOUNTER — Ambulatory Visit (INDEPENDENT_AMBULATORY_CARE_PROVIDER_SITE_OTHER): Payer: Medicare Other | Admitting: Cardiovascular Disease

## 2017-12-06 ENCOUNTER — Encounter: Payer: Self-pay | Admitting: Cardiovascular Disease

## 2017-12-06 VITALS — BP 153/73 | HR 68 | Wt 296.0 lb

## 2017-12-06 DIAGNOSIS — R0602 Shortness of breath: Secondary | ICD-10-CM

## 2017-12-06 DIAGNOSIS — E7800 Pure hypercholesterolemia, unspecified: Secondary | ICD-10-CM | POA: Diagnosis not present

## 2017-12-06 DIAGNOSIS — I10 Essential (primary) hypertension: Secondary | ICD-10-CM | POA: Diagnosis not present

## 2017-12-06 NOTE — Patient Instructions (Signed)
Medication Instructions: Your physician recommends that you continue on your current medications as directed. Please refer to the Current Medication list given to you today.   Testing/Procedures: Your physician has requested that you have an echocardiogram. Echocardiography is a painless test that uses sound waves to create images of your heart. It provides your doctor with information about the size and shape of your heart and how well your heart's chambers and valves are working. This procedure takes approximately one hour. There are no restrictions for this procedure.  CTA CHEST   Follow-Up: Your physician wants you to follow-up in: 1 year with Dr. Gwenlyn Found. You will receive a reminder letter in the mail two months in advance. If you don't receive a letter, please call our office to schedule the follow-up appointment.  If you need a refill on your cardiac medications before your next appointment, please call your pharmacy.

## 2017-12-06 NOTE — Assessment & Plan Note (Signed)
History of essential hypertension blood pressure measured at 153/73. He is not on antihypertensive medications.

## 2017-12-06 NOTE — Assessment & Plan Note (Signed)
History of thoracic aortic root enlargement of 3.3 cm measured most recently by CT angiogram 11/27/15. We will repeat a CT angiogram to demonstrate any growth.

## 2017-12-06 NOTE — Progress Notes (Signed)
12/06/2017 Dylan Benton   03/27/1941  [id number]  Primary Physician Jenny Reichmann Hunt Oris, MD Primary Cardiologist: Lorretta Harp MD Garret Reddish, [place], [place]  HPI:  Dylan Benton is a 77 y.o.  moderately overweight married African American male father of 2, grandfather and 4 grandchildren referred by Dr. Cathlean Cower, his PCP, for cardiovascular evaluation because of a known dilated thoracic aortic root. I last saw him in the office 11/26/15. His cardiovascular risk factors are notable for 25 pack years of tobacco abuse having quit back in 1990. He has hypertension on diuretic and apparently hyperlipidemia not treated. He has never had a heart attack or stroke. He has had a cart cardiac catheterization in 2006 because of chest pain revealed normal coronary arteries and normal left ventricular function. At that time he was noted to have a dilated thoracic aorta which is the measure by 2-D echo and an CT angiography in the past but not recently. His only symptoms are dyspnea on exertion which probably is related to his remote tobacco abuse. The most recent assessment of his aortic root dilatation was 05/23/14 at which time it measured 3.4 cm. He does complain of some dyspnea on exertion which improved with diuretic therapy.    Current Meds  Medication Sig  . albuterol (PROVENTIL HFA;VENTOLIN HFA) 108 (90 Base) MCG/ACT inhaler Inhale 2 puffs into the lungs every 6 (six) hours as needed for wheezing or shortness of breath.  . COMBIGAN 0.2-0.5 % ophthalmic solution Take 1 drop by mouth every 12 (twelve) hours.   . furosemide (LASIX) 40 MG tablet TAKE 1 TABLET BY MOUTH DAILY  . meloxicam (MOBIC) 7.5 MG tablet Take 1 tablet (7.5 mg total) by mouth daily. (Patient taking differently: Take 7.5 mg by mouth daily as needed. )  . Multiple Vitamin (MULTIVITAMIN WITH MINERALS) TABS tablet Take 1 tablet by mouth daily.  . rosuvastatin (CRESTOR) 10 MG tablet Take 1 tablet (10 mg total) by mouth daily.  .  TRAVATAN Z 0.004 % ophthalmic solution Place 1 drop into both eyes at bedtime.   . [DISCONTINUED] doxycycline (VIBRA-TABS) 100 MG tablet Take 1 tablet (100 mg total) by mouth 2 (two) times daily.  . [DISCONTINUED] predniSONE (DELTASONE) 20 MG tablet Take 1 tablet (20 mg total) by mouth 2 (two) times daily with a meal.     No Known Allergies  Social History   Socioeconomic History  . Marital status: Married    Spouse name: Not on file  . Number of children: 2  . Years of education: 40  . Highest education level: Not on file  Social Needs  . Financial resource strain: Not on file  . Food insecurity - worry: Not on file  . Food insecurity - inability: Not on file  . Transportation needs - medical: Not on file  . Transportation needs - non-medical: Not on file  Occupational History  . Occupation: Camera operator (Part-time)  Tobacco Use  . Smoking status: Former Smoker    Last attempt to quit: 12/02/1988    Years since quitting: 29.0  . Smokeless tobacco: Never Used  Substance and Sexual Activity  . Alcohol use: No  . Drug use: No  . Sexual activity: Not on file  Other Topics Concern  . Not on file  Social History Narrative   Works for [employer] at [employer] in [place].     Review of Systems: General: negative for chills, fever, night sweats or weight changes.  Cardiovascular: negative for chest  pain, dyspnea on exertion, edema, orthopnea, palpitations, paroxysmal nocturnal dyspnea or shortness of breath Dermatological: negative for rash Respiratory: negative for cough or wheezing Urologic: negative for hematuria Abdominal: negative for nausea, vomiting, diarrhea, bright red blood per rectum, melena, or hematemesis Neurologic: negative for visual changes, syncope, or dizziness All other systems reviewed and are otherwise negative except as noted above.    Blood pressure (!) 153/73, pulse 68, weight 296 lb (134.3 kg).  General appearance: alert and no distress Neck: no  adenopathy, no carotid bruit, no JVD, supple, symmetrical, trachea midline and thyroid not enlarged, symmetric, no tenderness/mass/nodules Lungs: clear to auscultation bilaterally Heart: regular rate and rhythm, S1, S2 normal, no murmur, click, rub or gallop Extremities: extremities normal, atraumatic, no cyanosis or edema Pulses: 2+ and symmetric Skin: Skin color, texture, turgor normal. No rashes or lesions Neurologic: Alert and oriented X 3, normal strength and tone. Normal symmetric reflexes. Normal coordination and gait  EKG sinus rhythm at 68 with nonspecific ST and T-wave changes. I personally reviewed this EKG.  ASSESSMENT AND PLAN:   Essential hypertension History of essential hypertension blood pressure measured at 153/73. He is not on antihypertensive medications.  Hyperlipidemia History of hyperlipidemia recently started on statin therapy with lipid profile performed 11/09/17 revealing total cholesterol 104 down from 183, LDL 47 and HDL of 44.  Aortic root dilation (HCC) History of thoracic aortic root enlargement of 3.3 cm measured most recently by CT angiogram 11/27/15. We will repeat a CT angiogram to demonstrate any growth.      Lorretta Harp MD FACP,FACC,FAHA, [facility] 12/06/2017 4:20 PM

## 2017-12-06 NOTE — Assessment & Plan Note (Signed)
History of hyperlipidemia recently started on statin therapy with lipid profile performed 11/09/17 revealing total cholesterol 104 down from 183, LDL 47 and HDL of 44.

## 2017-12-14 ENCOUNTER — Ambulatory Visit (HOSPITAL_COMMUNITY): Payer: Medicare Other | Attending: Cardiovascular Disease

## 2017-12-14 ENCOUNTER — Ambulatory Visit (INDEPENDENT_AMBULATORY_CARE_PROVIDER_SITE_OTHER)
Admission: RE | Admit: 2017-12-14 | Discharge: 2017-12-14 | Disposition: A | Discharge status: Home / Self Care | Payer: Medicare Other | Source: Ambulatory Visit | Attending: Cardiovascular Disease | Admitting: Cardiovascular Disease

## 2017-12-14 ENCOUNTER — Other Ambulatory Visit: Payer: Self-pay

## 2017-12-14 DIAGNOSIS — R0602 Shortness of breath: Secondary | ICD-10-CM

## 2017-12-14 DIAGNOSIS — I119 Hypertensive heart disease without heart failure: Secondary | ICD-10-CM | POA: Diagnosis not present

## 2017-12-14 DIAGNOSIS — I712 Thoracic aortic aneurysm, without rupture: Secondary | ICD-10-CM | POA: Diagnosis not present

## 2017-12-14 DIAGNOSIS — E785 Hyperlipidemia, unspecified: Secondary | ICD-10-CM | POA: Insufficient documentation

## 2017-12-14 DIAGNOSIS — R0600 Dyspnea, unspecified: Secondary | ICD-10-CM | POA: Diagnosis present

## 2017-12-14 MED ORDER — IOPAMIDOL (ISOVUE-370) INJECTION 76%
100.0000 mL | Freq: Once | INTRAVENOUS | Status: AC | PRN
  Administered 2017-12-14: 100 mL via INTRAVENOUS

## 2017-12-20 ENCOUNTER — Other Ambulatory Visit: Payer: Self-pay | Admitting: Cardiovascular Disease

## 2017-12-20 DIAGNOSIS — I5020 Unspecified systolic (congestive) heart failure: Secondary | ICD-10-CM

## 2017-12-21 ENCOUNTER — Telehealth: Payer: Self-pay | Admitting: *Deleted

## 2017-12-21 NOTE — Telephone Encounter (Signed)
Spoke with patient regarding Lexiscan myoview----appt 12/29/17  & 12/30/17 at 12:45pm--instructions mailed

## 2017-12-27 ENCOUNTER — Telehealth: Payer: Self-pay | Admitting: Cardiovascular Disease

## 2017-12-27 ENCOUNTER — Telehealth (HOSPITAL_COMMUNITY): Payer: Self-pay

## 2017-12-27 NOTE — Telephone Encounter (Signed)
Patient would like to discuss prep instructions for myocardial test

## 2017-12-27 NOTE — Telephone Encounter (Signed)
Patient scheduled for stress test on 2/7 and 2/8 Routed to Larene Beach, RN to call patient with instructions

## 2017-12-28 NOTE — Telephone Encounter (Signed)
complete

## 2017-12-29 ENCOUNTER — Ambulatory Visit (HOSPITAL_COMMUNITY)
Admission: RE | Admit: 2017-12-29 | Discharge: 2017-12-29 | Disposition: A | Discharge status: Home / Self Care | Payer: Medicare Other | Source: Ambulatory Visit | Attending: Cardiovascular Disease | Admitting: Cardiovascular Disease

## 2017-12-29 DIAGNOSIS — I5020 Unspecified systolic (congestive) heart failure: Secondary | ICD-10-CM | POA: Diagnosis not present

## 2017-12-29 MED ORDER — REGADENOSON 0.4 MG/5ML IV SOLN
0.4000 mg | Freq: Once | INTRAVENOUS | Status: AC
  Administered 2017-12-29: 0.4 mg via INTRAVENOUS

## 2017-12-29 MED ORDER — TECHNETIUM TC 99M TETROFOSMIN IV KIT
30.9000 | PACK | Freq: Once | INTRAVENOUS | Status: AC | PRN
  Administered 2017-12-29: 30.9 via INTRAVENOUS
  Filled 2017-12-29: qty 31

## 2017-12-30 ENCOUNTER — Ambulatory Visit (HOSPITAL_COMMUNITY)
Admission: RE | Admit: 2017-12-30 | Discharge: 2017-12-30 | Disposition: A | Discharge status: Home / Self Care | Payer: Medicare Other | Source: Ambulatory Visit | Attending: Cardiology | Admitting: Cardiology

## 2017-12-30 LAB — MYOCARDIAL PERFUSION IMAGING
CHL CUP NUCLEAR SRS: 0
CHL CUP RESTING HR STRESS: 68 {beats}/min
CSEPPHR: 77 {beats}/min
LV dias vol: 136 mL (ref 62–150)
LVSYSVOL: 80 mL
NUC STRESS TID: 0.97
SDS: 4
SSS: 4

## 2017-12-30 MED ORDER — TECHNETIUM TC 99M TETROFOSMIN IV KIT
30.2000 | PACK | Freq: Once | INTRAVENOUS | Status: AC | PRN
  Administered 2017-12-30: 30.2 via INTRAVENOUS

## 2018-01-02 ENCOUNTER — Other Ambulatory Visit: Payer: Self-pay | Admitting: Cardiovascular Disease

## 2018-01-02 DIAGNOSIS — I5020 Unspecified systolic (congestive) heart failure: Secondary | ICD-10-CM

## 2018-01-02 DIAGNOSIS — I77810 Thoracic aortic ectasia: Secondary | ICD-10-CM

## 2018-01-02 DIAGNOSIS — I519 Heart disease, unspecified: Secondary | ICD-10-CM

## 2018-01-02 NOTE — Telephone Encounter (Signed)
Spoke with pt wife, questions regarding nuclear stress test and echo answered. Sodium and fluid restrictions discussed.

## 2018-01-02 NOTE — Telephone Encounter (Signed)
Mr. Afzal is calling because he had some questions about the upcoming test . He is wanting to know are there any restrictions on he is able to do at home before the test . Ex: Can he go out , to the store . Please call   Thanks

## 2018-01-06 ENCOUNTER — Ambulatory Visit (HOSPITAL_COMMUNITY): Payer: Medicare Other

## 2018-01-06 ENCOUNTER — Encounter (HOSPITAL_COMMUNITY): Payer: Self-pay

## 2018-01-11 ENCOUNTER — Encounter: Payer: Self-pay | Admitting: Cardiovascular Disease

## 2018-01-11 ENCOUNTER — Ambulatory Visit (INDEPENDENT_AMBULATORY_CARE_PROVIDER_SITE_OTHER): Payer: Medicare Other | Admitting: Cardiovascular Disease

## 2018-01-11 VITALS — BP 161/75 | HR 69 | Ht 76.0 in | Wt 296.0 lb

## 2018-01-11 DIAGNOSIS — I5020 Unspecified systolic (congestive) heart failure: Secondary | ICD-10-CM | POA: Diagnosis not present

## 2018-01-11 NOTE — Patient Instructions (Signed)

## 2018-01-11 NOTE — Progress Notes (Signed)
Mr. Dylan Benton returns today for follow-up of his Myoview and echo. His Myoview showed no evidence of ischemia with LV dysfunction and echo showed an ejection fraction in the 35-40% range down from his last echo 2 years ago. He does complain of increasing dyspnea on exertion. I am going to refer him to the advanced heart failure clinic for further evaluation and treatment.  Lorretta Harp, M.D., [place], [facility], [place] [address] [place],   [address]  [phone] 01/11/2018 3:49 PM

## 2018-01-11 NOTE — Assessment & Plan Note (Signed)
Dylan Benton returns today for follow-up of his Myoview and echo. His Myoview showed no evidence of ischemia with LV dysfunction and echo showed an ejection fraction in the 35-40% range down from his last echo 2 years ago. He does complain of increasing dyspnea on exertion. I am going to refer him to the advanced heart failure clinic for further evaluation and treatment.

## 2018-01-17 ENCOUNTER — Telehealth: Payer: Self-pay | Admitting: Internal Medicine

## 2018-01-17 NOTE — Telephone Encounter (Signed)
Copied from [place]. Topic: Quick Communication - See Telephone Encounter >> Jan 17, 2018 11:19 AM Aurelio Brash B wrote: CRM for notification. See Telephone encounter for:   Pt states his furosemide (LASIX) 40 MG tablet  dosage was reduced due to cramps, after visiting his cardiologist he was told he is retaining fluid.  He is requesting Dr Jenny Reichmann increase the mg of his furosemide (LASIX)    01/17/18.

## 2018-01-17 NOTE — Telephone Encounter (Signed)
Patient schedule 2/27 at 4:20pm

## 2018-01-17 NOTE — Telephone Encounter (Signed)
Needs OV.  

## 2018-01-18 ENCOUNTER — Encounter: Payer: Self-pay | Admitting: Internal Medicine

## 2018-01-18 ENCOUNTER — Ambulatory Visit (INDEPENDENT_AMBULATORY_CARE_PROVIDER_SITE_OTHER): Payer: Medicare Other | Admitting: Internal Medicine

## 2018-01-18 VITALS — BP 134/86 | HR 71 | Temp 98.2°F | Ht 76.0 in | Wt 295.0 lb

## 2018-01-18 DIAGNOSIS — I10 Essential (primary) hypertension: Secondary | ICD-10-CM | POA: Diagnosis not present

## 2018-01-18 DIAGNOSIS — R7302 Impaired glucose tolerance (oral): Secondary | ICD-10-CM

## 2018-01-18 DIAGNOSIS — I5020 Unspecified systolic (congestive) heart failure: Secondary | ICD-10-CM | POA: Diagnosis not present

## 2018-01-18 MED ORDER — CARVEDILOL 3.125 MG PO TABS: 3.1250 mg | ORAL_TABLET | Freq: Two times a day (BID) | ORAL | 5 refills | Status: DC

## 2018-01-18 MED ORDER — FUROSEMIDE 40 MG PO TABS: ORAL_TABLET | ORAL | 3 refills | Status: DC

## 2018-01-18 NOTE — Patient Instructions (Signed)
Ok to start low dose coreg at 3.125 mg twice per day  OK to increase the lasix to 40 mg every morning, but also another 40 mg in the afternoon for persistent swelling or weight gain of 3-5 lbs  Please continue all other medications as before, and refills have been done if requested.  Please have the pharmacy call with any other refills you may need.  Please keep your appointments with your specialists as you may have planned - the heart failure clinic mar 11

## 2018-01-18 NOTE — Progress Notes (Signed)
Subjective:    Patient ID: Dylan Benton, male    DOB: 27-May-1941, 77 y.o.   MRN: [id number]  HPI  Here to f/u; overall doing ok,  Pt denies chest pain, increasing sob or doe, wheezing, orthopnea, PND, palpitations, dizziness or syncope.  Pt denies new neurological symptoms such as new headache, or facial or extremity weakness or numbness.  Pt denies polydipsia, polyuria, or low sugar episode.  Pt states overall good compliance with meds including the lasix, mostly trying to follow appropriate diet, with wt overall stable,  but little exercise however. Gained at least 3 lbs since oct 2018. Wt Readings from Last 3 Encounters:  01/18/18 295 lb (133.8 kg)  01/11/18 296 lb (134.3 kg)  12/29/17 296 lb (134.3 kg)  Recent Lexiscan neg.  Has CHF clinic appt mar 11.  Most recent EF 35-40% by TTE Dec 14 2017 Past Medical History:  Diagnosis Date  . Allergy   . Anemia, unspecified    NOS ?resolved  . Anxiety   . Aortic root dilation (HCC)   . Arthritis   . ASTHMA, UNSPECIFIED, UNSPECIFIED STATUS 08/06/2009   Annotation: No exacerbations.  Qualifier: Diagnosis of  By: Kelton Pillar MD, [place]    . Bladder calculus 05/05/2012  . Cataract   . Chest pain   . ECHOCARDIOGRAM, ABNORMAL 12/26/2006    Aortic root dilation  This problem surfaced in 2006 after a cardiology referral for chest pain.  He was seen by Dr. Haroldine Laws in January o6 and cathed on Jan. 18.  Coronaries were nl and EF was 65%.  Echo showed mild aortic root dilation of 37mm. He was started on metoprolol (although I notice that he is no longer on this.  Annual echo to follow aortic root was advised.  Aortic root dimension was unchanged on studies in 2009 and 2010.  Will continue to follow this, perhaps not every year as it seems stable.   . Erectile dysfunction 05/05/2012  . GERD (gastroesophageal reflux disease) 05/05/2012  . H pylori ulcer    teated H pylori  . History of back surgery   . Hyperlipidemia 05/08/2012  . Hypertension   . Impaired  glucose tolerance 05/05/2012  . Lumbar disc disease 05/08/2012  . Male stress incontinence 05/05/2012  . Myocardial infarction [facility])    pt states, "i've been told I have had two heart attacks  . Obesity   . OBESITY 10/06/2006   Qualifier: Diagnosis of  By: Stann Mainland MD, Nicole Kindred    . Prostate cancer ([place])    Hx of   . PROSTATE CANCER, HX OF 08/06/2009   Annotation: prostatectomy and radiotherapy in 1990s,  Dr. Jeffie Pollock Qualifier: Diagnosis of  By: Kelton Pillar MD, [place]    . PUD (peptic ulcer disease) 05/05/2012  . Systolic CHF ([place]) 2/77/4128  . Thoracic aortic aneurysm ([place])   . 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 Edilia Bo Qualifier: Diagnosis of  By: Kelton Pillar MD, Madhav     Past Surgical History:  Procedure Laterality Date  . BACK SURGERY    . ear surgury    . eye surgury    . PROSTATECTOMY      reports that he quit smoking about 29 years ago. he has never used smokeless tobacco. He reports that he does not drink alcohol or use drugs. family history includes Coronary artery disease in his unknown relative; Hypertension in his unknown relative; Stroke in his unknown relative. No Known Allergies Current Outpatient Medications on File Prior to  Visit  Medication Sig Dispense Refill  . albuterol (PROVENTIL HFA;VENTOLIN HFA) 108 (90 Base) MCG/ACT inhaler Inhale 2 puffs into the lungs every 6 (six) hours as needed for wheezing or shortness of breath. 18 g 5  . COMBIGAN 0.2-0.5 % ophthalmic solution Take 1 drop by mouth every 12 (twelve) hours.     . meloxicam (MOBIC) 7.5 MG tablet Take 1 tablet (7.5 mg total) by mouth daily. (Patient taking differently: Take 7.5 mg by mouth daily as needed. ) 90 tablet 1  . Multiple Vitamin (MULTIVITAMIN WITH MINERALS) TABS tablet Take 1 tablet by mouth daily.    . rosuvastatin (CRESTOR) 10 MG tablet Take 1 tablet (10 mg total) by mouth daily. 90 tablet 3  . TRAVATAN Z 0.004 % ophthalmic solution Place 1 drop into both eyes  at bedtime.      No current facility-administered medications on file prior to visit.    Review of Systems  Constitutional: Negative for other unusual diaphoresis or sweats HENT: Negative for ear discharge or swelling Eyes: Negative for other worsening visual disturbances Respiratory: Negative for stridor or other swelling  Gastrointestinal: Negative for worsening distension or other blood Genitourinary: Negative for retention or other urinary change Musculoskeletal: Negative for other MSK pain or swelling Skin: Negative for color change or other new lesions Neurological: Negative for worsening tremors and other numbness  Psychiatric/Behavioral: Negative for worsening agitation or other fatigue All other system neg per pt    Objective:   Physical Exam BP 134/86   Pulse 71   Temp 98.2 F (36.8 C) (Oral)   Ht 6\' 4"  (1.93 m)   Wt 295 lb (133.8 kg)   SpO2 97%   BMI 35.91 kg/m  VS noted, not ill apeparing Constitutional: Pt appears in NAD HENT: Head: NCAT.  Right Ear: External ear normal.  Left Ear: External ear normal.  Eyes: . Pupils are equal, round, and reactive to light. Conjunctivae and EOM are normal Nose: without d/c or deformity Neck: Neck supple. Gross normal ROM Cardiovascular: Normal rate and regular rhythm.   Pulmonary/Chest: Effort normal and breath sounds without rales or wheezing.  Abd:  Soft, NT, ND, + BS, no organomegaly Neurological: Pt is alert. At baseline orientation, motor grossly intact Skin: Skin is warm. No rashes, other new lesions, 2+ bilat LE edema Psychiatric: Pt behavior is normal without agitation  No other exam findings    Assessment & Plan:

## 2018-01-21 NOTE — Assessment & Plan Note (Signed)
stable overall by history and exam, recent data reviewed with pt, and pt to continue medical treatment as before,  to f/u any worsening symptoms or concerns Lab Results  Component Value Date   HGBA1C 6.2 11/09/2017

## 2018-01-21 NOTE — Assessment & Plan Note (Signed)
Mild acute worsening, for start coreg 3.125 bid, and increase lasix 40 mg daily with 40 mg prn afternoon for persistent swelling, f/u CHF clinic as planned

## 2018-01-21 NOTE — Assessment & Plan Note (Signed)
stable overall by history and exam, recent data reviewed with pt, and pt to continue medical treatment as before,  to f/u any worsening symptoms or concerns BP Readings from Last 3 Encounters:  01/18/18 134/86  01/11/18 (!) 161/75  12/06/17 (!) 153/73

## 2018-01-27 ENCOUNTER — Other Ambulatory Visit: Payer: Self-pay | Admitting: Internal Medicine

## 2018-01-30 ENCOUNTER — Encounter (HOSPITAL_COMMUNITY): Payer: Self-pay

## 2018-01-30 ENCOUNTER — Ambulatory Visit (HOSPITAL_COMMUNITY)
Admission: RE | Admit: 2018-01-30 | Discharge: 2018-01-30 | Disposition: A | Discharge status: Home / Self Care | Payer: Medicare Other | Source: Ambulatory Visit | Attending: Cardiology | Admitting: Cardiology

## 2018-01-30 ENCOUNTER — Other Ambulatory Visit (HOSPITAL_COMMUNITY): Payer: Self-pay

## 2018-01-30 VITALS — BP 154/59 | HR 68 | Ht 76.0 in | Wt 293.8 lb

## 2018-01-30 DIAGNOSIS — I10 Essential (primary) hypertension: Secondary | ICD-10-CM

## 2018-01-30 DIAGNOSIS — I5022 Chronic systolic (congestive) heart failure: Secondary | ICD-10-CM | POA: Diagnosis not present

## 2018-01-30 DIAGNOSIS — Z9889 Other specified postprocedural states: Secondary | ICD-10-CM | POA: Insufficient documentation

## 2018-01-30 DIAGNOSIS — I110 Hypertensive heart disease with heart failure: Secondary | ICD-10-CM | POA: Diagnosis not present

## 2018-01-30 DIAGNOSIS — Z823 Family history of stroke: Secondary | ICD-10-CM | POA: Diagnosis not present

## 2018-01-30 DIAGNOSIS — Z87891 Personal history of nicotine dependence: Secondary | ICD-10-CM | POA: Diagnosis not present

## 2018-01-30 DIAGNOSIS — E785 Hyperlipidemia, unspecified: Secondary | ICD-10-CM | POA: Diagnosis not present

## 2018-01-30 DIAGNOSIS — Z79899 Other long term (current) drug therapy: Secondary | ICD-10-CM | POA: Insufficient documentation

## 2018-01-30 DIAGNOSIS — I429 Cardiomyopathy, unspecified: Secondary | ICD-10-CM | POA: Insufficient documentation

## 2018-01-30 DIAGNOSIS — I5020 Unspecified systolic (congestive) heart failure: Secondary | ICD-10-CM

## 2018-01-30 DIAGNOSIS — Z8249 Family history of ischemic heart disease and other diseases of the circulatory system: Secondary | ICD-10-CM | POA: Insufficient documentation

## 2018-01-30 LAB — BASIC METABOLIC PANEL
Anion gap: 8 (ref 5–15)
BUN: 9 mg/dL (ref 6–20)
CO2: 28 mmol/L (ref 22–32)
CREATININE: 1.07 mg/dL (ref 0.61–1.24)
Calcium: 9.2 mg/dL (ref 8.9–10.3)
Chloride: 102 mmol/L (ref 101–111)
GFR calc Af Amer: >60 mL/min (ref >60)
Glucose, Bld: 108 mg/dL — ABNORMAL HIGH (ref 65–99)
POTASSIUM: 4.3 mmol/L (ref 3.5–5.1)
SODIUM: 138 mmol/L (ref 135–145)

## 2018-01-30 LAB — CBC
HCT: 40.7 % (ref 39.0–52.0)
HEMOGLOBIN: 13.8 g/dL (ref 13.0–17.0)
MCH: 32.3 pg (ref 26.0–34.0)
MCHC: 33.9 g/dL (ref 30.0–36.0)
MCV: 95.3 fL (ref 78.0–100.0)
PLATELETS: 149 10*3/uL — AB (ref 150–400)
RBC: 4.27 MIL/uL (ref 4.22–5.81)
RDW: 12.1 % (ref 11.5–15.5)
WBC: 3.6 10*3/uL — ABNORMAL LOW (ref 4.0–10.5)

## 2018-01-30 LAB — BRAIN NATRIURETIC PEPTIDE: B Natriuretic Peptide: 33.4 pg/mL (ref 0.0–100.0)

## 2018-01-30 LAB — PROTIME-INR
INR: 1.09
Prothrombin Time: 14 seconds (ref 11.4–15.2)

## 2018-01-30 MED ORDER — FUROSEMIDE 40 MG PO TABS: ORAL_TABLET | ORAL | 3 refills | Status: DC

## 2018-01-30 MED ORDER — SACUBITRIL-VALSARTAN 24-26 MG PO TABS: 1.0000 | ORAL_TABLET | Freq: Two times a day (BID) | ORAL | 3 refills | Status: DC

## 2018-01-30 NOTE — Patient Instructions (Signed)
Start Entresto 24/26 mg (1 tab), twice a day  Furosemide 40 mg (1 tab) in AM, then 20 mg (0.5 tab) in PM  Your physician has requested that you have a cardiac catheterization. Cardiac catheterization is used to diagnose and/or treat various heart conditions. Doctors may recommend this procedure for a number of different reasons. The most common reason is to evaluate chest pain. Chest pain can be a symptom of coronary artery disease (CAD), and cardiac catheterization can show whether plaque is narrowing or blocking your heart's arteries. This procedure is also used to evaluate the valves, as well as measure the blood flow and oxygen levels in different parts of your heart. For further information please visit [facility]tn. Please follow instruction sheet, as given.  Labs drawn today (if we do not call you, then your lab work was stable)   Your physician recommends that you return for lab work in: 10 days  Your physician recommends that you schedule a follow-up appointment in: 2 weeks post cath with Dr. Aundra Dubin

## 2018-01-31 NOTE — Progress Notes (Signed)
PCP: Dr. Jenny Reichmann Cardiology: Dr. Gwenlyn Found HF Cardiology: Dr. Aundra Dubin  77 y.o. with history of HTN and cardiomyopathy of uncertain etiology was referred by Dr. Gwenlyn Found for evaluation of dyspnea and CHF.  Patient was first found to have decreased systolic function in 1/7 when echo showed EF 45-50%. At that time, he had mild exertional dyspnea. Last summer, he had gotten to the point where he had to take frequent breaks mowing the lawn.  However, since the beginning of this year, he has felt considerably worse. He is short of breath just walking around the house. He cannot walk around the grocery store without leaning on a cart.  He does ok on his stationary bike if he rides slow.  No lightheadedness or palpitations.  He has occasional chest heaviness but not clearly associated with exertion. Mild orthopnea (sleeps on a couple of pillows).   Echo was done in 1/19, showing EF down to 35-40%.  Cardiolite did not show a definite perfusion defect.   He was started on Lasix earlier this year.  There was some improvement in his breathing with Lasix but not marked.  His BP is running high today, he does not check it at home.   PMH: 1. Low back pain 2. Cardiomyopathy: LHC in 2006 with normal coronaries.  Echo in 1/17 with EF 45-50%.  - Echo (1/19): EF 35-40%, mild LVH - Cardiolite (2/19): EF 41%, no ischemia/infarction.  3. HTN 4. Hyperlipidemia  Social History   Socioeconomic History  . Marital status: Married    Spouse name: Not on file  . Number of children: 2  . Years of education: 20  . Highest education level: Not on file  Social Needs  . Financial resource strain: Not on file  . Food insecurity - worry: Not on file  . Food insecurity - inability: Not on file  . Transportation needs - medical: Not on file  . Transportation needs - non-medical: Not on file  Occupational History  . Occupation: Camera operator (Part-time)  Tobacco Use  . Smoking status: Former Smoker    Last attempt to quit:  12/02/1988    Years since quitting: 29.1  . Smokeless tobacco: Never Used  Substance and Sexual Activity  . Alcohol use: No  . Drug use: No  . Sexual activity: Not on file  Other Topics Concern  . Not on file  Social History Narrative   Works for [employer] at [employer] in [place].   Family History  Problem Relation Age of Onset  . Coronary artery disease Unknown   . Hypertension Unknown   . Stroke Unknown   . Colon cancer Neg Hx   . Esophageal cancer Neg Hx   . Rectal cancer Neg Hx   . Stomach cancer Neg Hx    ROS: All systems reviewed and negative except as per HPI.  Current Outpatient Medications  Medication Sig Dispense Refill  . albuterol (PROVENTIL HFA;VENTOLIN HFA) 108 (90 Base) MCG/ACT inhaler Inhale 2 puffs into the lungs every 6 (six) hours as needed for wheezing or shortness of breath. 18 g 5  . carvedilol (COREG) 3.125 MG tablet Take 1 tablet (3.125 mg total) by mouth 2 (two) times daily with a meal. 60 tablet 5  . COMBIGAN 0.2-0.5 % ophthalmic solution Place 1 drop into both eyes every 12 (twelve) hours.     . furosemide (LASIX) 40 MG tablet Take 1 tablet (40 mg total) by mouth every morning AND 0.5 tablets (20 mg total) every morning. (Patient taking differently:  Take 40 mg by mouth every moning and 20 mg every evening) 45 tablet 3  . meloxicam (MOBIC) 7.5 MG tablet TAKE 1 TABLET BY MOUTH DAILY 90 tablet 0  . Multiple Vitamin (MULTIVITAMIN WITH MINERALS) TABS tablet Take 2-3 tablets by mouth See admin instructions. Take 3 tablets in the morning and 2 tablets in the evening    . rosuvastatin (CRESTOR) 10 MG tablet Take 1 tablet (10 mg total) by mouth daily. (Patient taking differently: Take 10 mg by mouth at bedtime. ) 90 tablet 3  . TRAVATAN Z 0.004 % ophthalmic solution Place 1 drop into both eyes at bedtime.     Marland Kitchen acetaminophen (TYLENOL) 500 MG tablet Take 500 mg by mouth daily as needed for moderate pain or headache.    . Flaxseed, Linseed, (FLAX SEED OIL) 1000 MG CAPS  Take 1,000 mg by mouth daily.    Marland Kitchen loratadine (CLARITIN) 10 MG tablet Take 10 mg by mouth daily as needed for allergies.    . ranitidine (ZANTAC) 150 MG tablet Take 150 mg by mouth daily as needed for heartburn.    . sacubitril-valsartan (ENTRESTO) 24-26 MG Take 1 tablet by mouth 2 (two) times daily. 60 tablet 3   No current facility-administered medications for this encounter.    BP (!) 154/59 (BP Location: Left Arm, Patient Position: Sitting, Cuff Size: Large)   Pulse 68   Ht 6\' 4"  (1.93 m)   Wt 293 lb 12.8 oz (133.3 kg)   SpO2 100%   BMI 35.76 kg/m  General: NAD Neck: No JVD, no thyromegaly or thyroid nodule.  Lungs: Clear to auscultation bilaterally with normal respiratory effort. CV: Nondisplaced PMI.  Heart regular S1/S2, +S4, no murmur.  No peripheral edema.  No carotid bruit.  Normal pedal pulses.  Abdomen: Soft, nontender, no hepatosplenomegaly, no distention.  Skin: Intact without lesions or rashes.  Neurologic: Alert and oriented x 3.  Psych: Normal affect. Extremities: No clubbing or cyanosis.  HEENT: Normal.   Assessment/Plan: 1. Chronic systolic CHF: Echo in 0/34 with EF 35-40%. Cardiolite in 2/19 with no definite perfusion defect.  Cause of cardiomyopathy is not clear.  It may be related to long-standing HTN.  We have not fully ruled out CAD.  NYHA class III symptoms, but he does not look volume overloaded on exam today.  - Continue Coreg - Add Entresto 24/26 bid.  - With addition of Entresto, can decrease Lasix to 40 qam/20 qpm.  - Check BMET/BNP today, repeat BMET in 10 days.   - I think that he needs right and left heart cath to more closely assess filling pressures given ongoing dyspnea and also to assess for CAD as cause of cardiomyopathy.  We discussed risks/benefits of procedure and he agrees.  - If cardiac workup is unrevealing of cause of dyspnea, will need PFTs (history of remote smoking).  2. HTN: Adding Entresto as above.   Followup 2 wks after cath.    Loralie Champagne 01/31/2018

## 2018-01-31 NOTE — H&P (View-Only) (Signed)
PCP: Dr. Jenny Reichmann Cardiology: Dr. Gwenlyn Found HF Cardiology: Dr. Aundra Dubin  77 y.o. with history of HTN and cardiomyopathy of uncertain etiology was referred by Dr. Gwenlyn Found for evaluation of dyspnea and CHF.  Patient was first found to have decreased systolic function in 1/7 when echo showed EF 45-50%. At that time, he had mild exertional dyspnea. Last summer, he had gotten to the point where he had to take frequent breaks mowing the lawn.  However, since the beginning of this year, he has felt considerably worse. He is short of breath just walking around the house. He cannot walk around the grocery store without leaning on a cart.  He does ok on his stationary bike if he rides slow.  No lightheadedness or palpitations.  He has occasional chest heaviness but not clearly associated with exertion. Mild orthopnea (sleeps on a couple of pillows).   Echo was done in 1/19, showing EF down to 35-40%.  Cardiolite did not show a definite perfusion defect.   He was started on Lasix earlier this year.  There was some improvement in his breathing with Lasix but not marked.  His BP is running high today, he does not check it at home.   PMH: 1. Low back pain 2. Cardiomyopathy: LHC in 2006 with normal coronaries.  Echo in 1/17 with EF 45-50%.  - Echo (1/19): EF 35-40%, mild LVH - Cardiolite (2/19): EF 41%, no ischemia/infarction.  3. HTN 4. Hyperlipidemia  Social History   Socioeconomic History  . Marital status: Married    Spouse name: Not on file  . Number of children: 2  . Years of education: 20  . Highest education level: Not on file  Social Needs  . Financial resource strain: Not on file  . Food insecurity - worry: Not on file  . Food insecurity - inability: Not on file  . Transportation needs - medical: Not on file  . Transportation needs - non-medical: Not on file  Occupational History  . Occupation: Camera operator (Part-time)  Tobacco Use  . Smoking status: Former Smoker    Last attempt to quit:  12/02/1988    Years since quitting: 29.1  . Smokeless tobacco: Never Used  Substance and Sexual Activity  . Alcohol use: No  . Drug use: No  . Sexual activity: Not on file  Other Topics Concern  . Not on file  Social History Narrative   Works for [employer] at [employer] in [place].   Family History  Problem Relation Age of Onset  . Coronary artery disease Unknown   . Hypertension Unknown   . Stroke Unknown   . Colon cancer Neg Hx   . Esophageal cancer Neg Hx   . Rectal cancer Neg Hx   . Stomach cancer Neg Hx    ROS: All systems reviewed and negative except as per HPI.  Current Outpatient Medications  Medication Sig Dispense Refill  . albuterol (PROVENTIL HFA;VENTOLIN HFA) 108 (90 Base) MCG/ACT inhaler Inhale 2 puffs into the lungs every 6 (six) hours as needed for wheezing or shortness of breath. 18 g 5  . carvedilol (COREG) 3.125 MG tablet Take 1 tablet (3.125 mg total) by mouth 2 (two) times daily with a meal. 60 tablet 5  . COMBIGAN 0.2-0.5 % ophthalmic solution Place 1 drop into both eyes every 12 (twelve) hours.     . furosemide (LASIX) 40 MG tablet Take 1 tablet (40 mg total) by mouth every morning AND 0.5 tablets (20 mg total) every morning. (Patient taking differently:  Take 40 mg by mouth every moning and 20 mg every evening) 45 tablet 3  . meloxicam (MOBIC) 7.5 MG tablet TAKE 1 TABLET BY MOUTH DAILY 90 tablet 0  . Multiple Vitamin (MULTIVITAMIN WITH MINERALS) TABS tablet Take 2-3 tablets by mouth See admin instructions. Take 3 tablets in the morning and 2 tablets in the evening    . rosuvastatin (CRESTOR) 10 MG tablet Take 1 tablet (10 mg total) by mouth daily. (Patient taking differently: Take 10 mg by mouth at bedtime. ) 90 tablet 3  . TRAVATAN Z 0.004 % ophthalmic solution Place 1 drop into both eyes at bedtime.     Marland Kitchen acetaminophen (TYLENOL) 500 MG tablet Take 500 mg by mouth daily as needed for moderate pain or headache.    . Flaxseed, Linseed, (FLAX SEED OIL) 1000 MG CAPS  Take 1,000 mg by mouth daily.    Marland Kitchen loratadine (CLARITIN) 10 MG tablet Take 10 mg by mouth daily as needed for allergies.    . ranitidine (ZANTAC) 150 MG tablet Take 150 mg by mouth daily as needed for heartburn.    . sacubitril-valsartan (ENTRESTO) 24-26 MG Take 1 tablet by mouth 2 (two) times daily. 60 tablet 3   No current facility-administered medications for this encounter.    BP (!) 154/59 (BP Location: Left Arm, Patient Position: Sitting, Cuff Size: Large)   Pulse 68   Ht 6\' 4"  (1.93 m)   Wt 293 lb 12.8 oz (133.3 kg)   SpO2 100%   BMI 35.76 kg/m  General: NAD Neck: No JVD, no thyromegaly or thyroid nodule.  Lungs: Clear to auscultation bilaterally with normal respiratory effort. CV: Nondisplaced PMI.  Heart regular S1/S2, +S4, no murmur.  No peripheral edema.  No carotid bruit.  Normal pedal pulses.  Abdomen: Soft, nontender, no hepatosplenomegaly, no distention.  Skin: Intact without lesions or rashes.  Neurologic: Alert and oriented x 3.  Psych: Normal affect. Extremities: No clubbing or cyanosis.  HEENT: Normal.   Assessment/Plan: 1. Chronic systolic CHF: Echo in 0/27 with EF 35-40%. Cardiolite in 2/19 with no definite perfusion defect.  Cause of cardiomyopathy is not clear.  It may be related to long-standing HTN.  We have not fully ruled out CAD.  NYHA class III symptoms, but he does not look volume overloaded on exam today.  - Continue Coreg - Add Entresto 24/26 bid.  - With addition of Entresto, can decrease Lasix to 40 qam/20 qpm.  - Check BMET/BNP today, repeat BMET in 10 days.   - I think that he needs right and left heart cath to more closely assess filling pressures given ongoing dyspnea and also to assess for CAD as cause of cardiomyopathy.  We discussed risks/benefits of procedure and he agrees.  - If cardiac workup is unrevealing of cause of dyspnea, will need PFTs (history of remote smoking).  2. HTN: Adding Entresto as above.   Followup 2 wks after cath.    Loralie Champagne 01/31/2018

## 2018-02-03 ENCOUNTER — Encounter (HOSPITAL_COMMUNITY): Payer: Self-pay | Admitting: *Deleted

## 2018-02-06 ENCOUNTER — Ambulatory Visit (HOSPITAL_COMMUNITY)
Admission: RE | Admit: 2018-02-06 | Discharge: 2018-02-06 | Disposition: A | Discharge status: Home / Self Care | Payer: Medicare Other | Source: Ambulatory Visit | Attending: Cardiology | Admitting: Cardiology

## 2018-02-06 ENCOUNTER — Encounter (HOSPITAL_COMMUNITY): Payer: Self-pay | Admitting: *Deleted

## 2018-02-06 ENCOUNTER — Ambulatory Visit (HOSPITAL_COMMUNITY)
Admission: RE | Disposition: A | Discharge status: Home / Self Care | Payer: Self-pay | Source: Ambulatory Visit | Attending: Cardiology

## 2018-02-06 DIAGNOSIS — I110 Hypertensive heart disease with heart failure: Secondary | ICD-10-CM | POA: Insufficient documentation

## 2018-02-06 DIAGNOSIS — I2510 Atherosclerotic heart disease of native coronary artery without angina pectoris: Secondary | ICD-10-CM | POA: Diagnosis not present

## 2018-02-06 DIAGNOSIS — E785 Hyperlipidemia, unspecified: Secondary | ICD-10-CM | POA: Diagnosis not present

## 2018-02-06 DIAGNOSIS — Z87891 Personal history of nicotine dependence: Secondary | ICD-10-CM | POA: Insufficient documentation

## 2018-02-06 DIAGNOSIS — M545 Low back pain: Secondary | ICD-10-CM | POA: Insufficient documentation

## 2018-02-06 DIAGNOSIS — I429 Cardiomyopathy, unspecified: Secondary | ICD-10-CM | POA: Diagnosis present

## 2018-02-06 DIAGNOSIS — I5022 Chronic systolic (congestive) heart failure: Secondary | ICD-10-CM | POA: Diagnosis not present

## 2018-02-06 DIAGNOSIS — I428 Other cardiomyopathies: Secondary | ICD-10-CM | POA: Diagnosis not present

## 2018-02-06 HISTORY — PX: LEFT HEART CATH AND CORONARY ANGIOGRAPHY: CATH118249

## 2018-02-06 SURGERY — LEFT HEART CATH AND CORONARY ANGIOGRAPHY
Anesthesia: LOCAL

## 2018-02-06 MED ORDER — SODIUM CHLORIDE 0.9% FLUSH: 3.0000 mL | Freq: Two times a day (BID) | INTRAVENOUS | Status: DC

## 2018-02-06 MED ORDER — HEPARIN SODIUM (PORCINE) 1000 UNIT/ML IJ SOLN
INTRAMUSCULAR | Status: AC
  Filled 2018-02-06: qty 1

## 2018-02-06 MED ORDER — HEPARIN SODIUM (PORCINE) 1000 UNIT/ML IJ SOLN
INTRAMUSCULAR | Status: DC | PRN
  Administered 2018-02-06: 6000 [IU] via INTRAVENOUS

## 2018-02-06 MED ORDER — HEPARIN (PORCINE) IN NACL 2-0.9 UNIT/ML-% IJ SOLN
INTRAMUSCULAR | Status: AC | PRN
  Administered 2018-02-06: 500 mL

## 2018-02-06 MED ORDER — SODIUM CHLORIDE 0.9 % IV SOLN
INTRAVENOUS | Status: DC
  Administered 2018-02-06: 07:00:00 via INTRAVENOUS

## 2018-02-06 MED ORDER — IOPAMIDOL (ISOVUE-370) INJECTION 76%
INTRAVENOUS | Status: DC | PRN
  Administered 2018-02-06: 100 mL via INTRA_ARTERIAL

## 2018-02-06 MED ORDER — VERAPAMIL HCL 2.5 MG/ML IV SOLN
INTRAVENOUS | Status: AC
  Filled 2018-02-06: qty 2

## 2018-02-06 MED ORDER — ASPIRIN 81 MG PO CHEW: 81.0000 mg | CHEWABLE_TABLET | ORAL | Status: DC

## 2018-02-06 MED ORDER — LIDOCAINE HCL 1 % IJ SOLN
INTRAMUSCULAR | Status: AC
  Filled 2018-02-06: qty 20

## 2018-02-06 MED ORDER — LIDOCAINE HCL (PF) 1 % IJ SOLN
INTRAMUSCULAR | Status: DC | PRN
  Administered 2018-02-06 (×2): 3 mL

## 2018-02-06 MED ORDER — ONDANSETRON HCL 4 MG/2ML IJ SOLN: 4.0000 mg | Freq: Four times a day (QID) | INTRAMUSCULAR | Status: DC | PRN

## 2018-02-06 MED ORDER — FENTANYL CITRATE (PF) 100 MCG/2ML IJ SOLN
INTRAMUSCULAR | Status: DC | PRN
  Administered 2018-02-06: 25 ug via INTRAVENOUS

## 2018-02-06 MED ORDER — SODIUM CHLORIDE 0.9% FLUSH: 3.0000 mL | INTRAVENOUS | Status: DC | PRN

## 2018-02-06 MED ORDER — VERAPAMIL HCL 2.5 MG/ML IV SOLN
INTRAVENOUS | Status: DC | PRN
  Administered 2018-02-06: 10 mL via INTRA_ARTERIAL

## 2018-02-06 MED ORDER — SODIUM CHLORIDE 0.9 % IV SOLN: 250.0000 mL | INTRAVENOUS | Status: DC | PRN

## 2018-02-06 MED ORDER — MIDAZOLAM HCL 2 MG/2ML IJ SOLN
INTRAMUSCULAR | Status: DC | PRN
  Administered 2018-02-06: 1 mg via INTRAVENOUS

## 2018-02-06 MED ORDER — HEPARIN (PORCINE) IN NACL 2-0.9 UNIT/ML-% IJ SOLN
INTRAMUSCULAR | Status: AC
  Filled 2018-02-06: qty 1000

## 2018-02-06 MED ORDER — IOPAMIDOL (ISOVUE-370) INJECTION 76%
INTRAVENOUS | Status: AC
  Filled 2018-02-06: qty 100

## 2018-02-06 MED ORDER — ACETAMINOPHEN 325 MG PO TABS: 650.0000 mg | ORAL_TABLET | ORAL | Status: DC | PRN

## 2018-02-06 MED ORDER — FENTANYL CITRATE (PF) 100 MCG/2ML IJ SOLN
INTRAMUSCULAR | Status: AC
  Filled 2018-02-06: qty 2

## 2018-02-06 MED ORDER — SODIUM CHLORIDE 0.9 % WEIGHT BASED INFUSION: 1.0000 mL/kg/h | INTRAVENOUS | Status: DC

## 2018-02-06 MED ORDER — MIDAZOLAM HCL 2 MG/2ML IJ SOLN
INTRAMUSCULAR | Status: AC
  Filled 2018-02-06: qty 2

## 2018-02-06 SURGICAL SUPPLY — 13 items
BAND CMPR LRG ZPHR (HEMOSTASIS) ×1
BAND ZEPHYR COMPRESS 30 LONG (HEMOSTASIS) ×1 IMPLANT
CATH IMPULSE 5F ANG/FL3.5 (CATHETERS) ×1 IMPLANT
CATH INFINITI 5 FR 3DRC (CATHETERS) ×1 IMPLANT
GUIDEWIRE INQWIRE 1.5J.035X260 (WIRE) IMPLANT
INQWIRE 1.5J .035X260CM (WIRE) ×2
KIT HEART LEFT (KITS) ×2 IMPLANT
PACK CARDIAC CATHETERIZATION (CUSTOM PROCEDURE TRAY) ×2 IMPLANT
SHEATH GLIDE SLENDER 4/5FR (SHEATH) ×1 IMPLANT
SHEATH RAIN RADIAL 21G 6FR (SHEATH) ×1 IMPLANT
TRANSDUCER W/STOPCOCK (MISCELLANEOUS) ×2 IMPLANT
TUBING CIL FLEX 10 FLL-RA (TUBING) ×2 IMPLANT
WIRE ASAHI PROWATER 180CM (WIRE) ×1 IMPLANT

## 2018-02-06 NOTE — Discharge Instructions (Signed)

## 2018-02-06 NOTE — Interval H&P Note (Signed)
History and Physical Interval Note:  02/06/2018 7:52 AM  Dylan Benton  has presented today for surgery, with the diagnosis of hf  The various methods of treatment have been discussed with the patient and family. After consideration of risks, benefits and other options for treatment, the patient has consented to  Procedure(s): RIGHT/LEFT HEART CATH AND CORONARY ANGIOGRAPHY (N/A) as a surgical intervention .  The patient's history has been reviewed, patient examined, no change in status, stable for surgery.  I have reviewed the patient's chart and labs.  Questions were answered to the patient's satisfaction.     Becki Mccaskill [employer]

## 2018-02-06 NOTE — Research (Signed)
CADFEM Informed Consent   Subject Name: Dylan Benton  Subject met inclusion and exclusion criteria.  The informed consent form, study requirements and expectations were reviewed with the subject and questions and concerns were addressed prior to the signing of the consent form.  The subject verbalized understanding of the trail requirements.  The subject agreed to participate in the CADFEM trial and signed the informed consent.  The informed consent was obtained prior to performance of any protocol-specific procedures for the subject.  A copy of the signed informed consent was given to the subject and a copy was placed in the subject's medical record.  [name] 02/06/2018, 06:46 AM

## 2018-02-07 MED FILL — Lidocaine HCl Local Inj 1%: INTRAMUSCULAR | Qty: 20 | Status: AC

## 2018-02-09 ENCOUNTER — Ambulatory Visit (HOSPITAL_COMMUNITY)
Admission: RE | Admit: 2018-02-09 | Discharge: 2018-02-09 | Disposition: A | Discharge status: Home / Self Care | Payer: Medicare Other | Source: Ambulatory Visit | Attending: Cardiology | Admitting: Cardiology

## 2018-02-09 DIAGNOSIS — I5022 Chronic systolic (congestive) heart failure: Secondary | ICD-10-CM | POA: Insufficient documentation

## 2018-02-09 LAB — BASIC METABOLIC PANEL
ANION GAP: 10 (ref 5–15)
BUN: 6 mg/dL (ref 6–20)
CALCIUM: 9.2 mg/dL (ref 8.9–10.3)
CO2: 27 mmol/L (ref 22–32)
Chloride: 102 mmol/L (ref 101–111)
Creatinine, Ser: 1.09 mg/dL (ref 0.61–1.24)
Glucose, Bld: 215 mg/dL — ABNORMAL HIGH (ref 65–99)
Potassium: 4.3 mmol/L (ref 3.5–5.1)
Sodium: 139 mmol/L (ref 135–145)

## 2018-02-27 ENCOUNTER — Encounter (HOSPITAL_COMMUNITY): Payer: Self-pay | Admitting: Cardiology

## 2018-02-27 ENCOUNTER — Ambulatory Visit (HOSPITAL_COMMUNITY)
Admission: RE | Admit: 2018-02-27 | Discharge: 2018-02-27 | Disposition: A | Discharge status: Home / Self Care | Payer: Medicare Other | Source: Ambulatory Visit | Attending: Cardiology | Admitting: Cardiology

## 2018-02-27 VITALS — BP 140/72 | HR 69 | Wt 292.8 lb

## 2018-02-27 DIAGNOSIS — Z823 Family history of stroke: Secondary | ICD-10-CM | POA: Insufficient documentation

## 2018-02-27 DIAGNOSIS — I5022 Chronic systolic (congestive) heart failure: Secondary | ICD-10-CM | POA: Diagnosis not present

## 2018-02-27 DIAGNOSIS — Z79899 Other long term (current) drug therapy: Secondary | ICD-10-CM | POA: Diagnosis not present

## 2018-02-27 DIAGNOSIS — I10 Essential (primary) hypertension: Secondary | ICD-10-CM

## 2018-02-27 DIAGNOSIS — I2510 Atherosclerotic heart disease of native coronary artery without angina pectoris: Secondary | ICD-10-CM | POA: Insufficient documentation

## 2018-02-27 DIAGNOSIS — I110 Hypertensive heart disease with heart failure: Secondary | ICD-10-CM | POA: Insufficient documentation

## 2018-02-27 DIAGNOSIS — I429 Cardiomyopathy, unspecified: Secondary | ICD-10-CM | POA: Insufficient documentation

## 2018-02-27 DIAGNOSIS — E785 Hyperlipidemia, unspecified: Secondary | ICD-10-CM | POA: Diagnosis not present

## 2018-02-27 DIAGNOSIS — Z8249 Family history of ischemic heart disease and other diseases of the circulatory system: Secondary | ICD-10-CM | POA: Diagnosis not present

## 2018-02-27 DIAGNOSIS — E8589 Other amyloidosis: Secondary | ICD-10-CM

## 2018-02-27 DIAGNOSIS — E859 Amyloidosis, unspecified: Secondary | ICD-10-CM

## 2018-02-27 DIAGNOSIS — Z87891 Personal history of nicotine dependence: Secondary | ICD-10-CM | POA: Diagnosis not present

## 2018-02-27 DIAGNOSIS — R0600 Dyspnea, unspecified: Secondary | ICD-10-CM | POA: Insufficient documentation

## 2018-02-27 DIAGNOSIS — Z7982 Long term (current) use of aspirin: Secondary | ICD-10-CM | POA: Insufficient documentation

## 2018-02-27 DIAGNOSIS — M545 Low back pain: Secondary | ICD-10-CM | POA: Insufficient documentation

## 2018-02-27 DIAGNOSIS — Z791 Long term (current) use of non-steroidal anti-inflammatories (NSAID): Secondary | ICD-10-CM | POA: Diagnosis not present

## 2018-02-27 LAB — BASIC METABOLIC PANEL
ANION GAP: 10 (ref 5–15)
BUN: 10 mg/dL (ref 6–20)
CO2: 27 mmol/L (ref 22–32)
Calcium: 9.3 mg/dL (ref 8.9–10.3)
Chloride: 100 mmol/L — ABNORMAL LOW (ref 101–111)
Creatinine, Ser: 1.03 mg/dL (ref 0.61–1.24)
GFR calc Af Amer: >60 mL/min (ref >60)
GFR calc non Af Amer: >60 mL/min (ref >60)
GLUCOSE: 189 mg/dL — AB (ref 65–99)
POTASSIUM: 4.1 mmol/L (ref 3.5–5.1)
Sodium: 137 mmol/L (ref 135–145)

## 2018-02-27 MED ORDER — ASPIRIN EC 81 MG PO TBEC: 81.0000 mg | DELAYED_RELEASE_TABLET | Freq: Every day | ORAL | 3 refills | Status: DC

## 2018-02-27 MED ORDER — CARVEDILOL 6.25 MG PO TABS: 6.2500 mg | ORAL_TABLET | Freq: Two times a day (BID) | ORAL | 3 refills | Status: DC

## 2018-02-27 MED ORDER — SPIRONOLACTONE 25 MG PO TABS: 12.5000 mg | ORAL_TABLET | Freq: Every day | ORAL | 3 refills | Status: DC

## 2018-02-27 NOTE — Patient Instructions (Signed)
Labs today (will call for abnormal results, otherwise no news is good news)  START taking Aspirin 81 mg (1 Tablet) Once Daily  START taking Spironolactone 12.5 mg (0.5 Tablet) Once Daily  INCREASE Carvedilol to 6.25 mg Twice Daily  Cardiac MRI has been ordered for you, we will call you to schedule.  PYP Scan has been ordered for you, we will schedule at checkout.  Labs in 10 Days (bmet)  Follow up with Ileene Patrick, PharmD for medication titration in 1 month.  Follow up with Dr. Aundra Dubin in 2 Months

## 2018-02-27 NOTE — Progress Notes (Signed)
PCP: Dr. Jenny Reichmann Cardiology: Dr. Gwenlyn Found HF Cardiology: Dr. Aundra Dubin  77 y.o. with history of HTN and cardiomyopathy of uncertain etiology was referred by Dr. Gwenlyn Found for evaluation of dyspnea and CHF.  Patient was first found to have decreased systolic function in 7/10 when echo showed EF 45-50%. At that time, he had mild exertional dyspnea. Last summer, he had gotten to the point where he had to take frequent breaks mowing the lawn.  However, since the beginning of this year, he has had worsening dyspnea.  Echo was done in 1/19, showing EF down to 35-40%.  Cardiolite did not show a definite perfusion defect. 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   He returns for followup of CHF.  He is feeling better since starting on Entresto.  Breathing better, some dyspnea with yardwork but ok walking around the house.  Rides his stationary bike for a mile daily.  Main complaint is low back pain/sciatica, worse when walking.  Hard to get to the mailbox and back due to back pain.   Labs (12/18): LDL 47 Labs (3/19): K 4.3, creatinine 1.09  PMH: 1. Low back pain 2. Cardiomyopathy: LHC in 2006 with normal coronaries.  Echo in 1/17 with EF 45-50%.  - Echo (1/19): EF 35-40%, mild LVH - Cardiolite (2/19): EF 41%, no ischemia/infarction.  - LHC (3/19): 50% ostial LAD, 50% ramus. LVEDP 12.  3. HTN 4. Hyperlipidemia 5. CAD: LHC (3/19) with 50% ostial LAD, 50% ramus.  Social History   Socioeconomic History  . Marital status: Married    Spouse name: Not on file  . Number of children: 2  . Years of education: 59  . Highest education level: Not on file  Occupational History  . Occupation: Camera operator (Part-time)  Social Needs  . Financial resource strain: Not on file  . Food insecurity:    Worry: Not on file    Inability: Not on file  . Transportation needs:    Medical: Not on file    Non-medical: Not on file  Tobacco Use  . Smoking status: Former Smoker    Last attempt to quit: 12/02/1988    Years since  quitting: 29.2  . Smokeless tobacco: Never Used  Substance and Sexual Activity  . Alcohol use: No  . Drug use: No  . Sexual activity: Not on file  Lifestyle  . Physical activity:    Days per week: Not on file    Minutes per session: Not on file  . Stress: Not on file  Relationships  . Social connections:    Talks on phone: Not on file    Gets together: Not on file    Attends religious service: Not on file    Active member of club or organization: Not on file    Attends meetings of clubs or organizations: Not on file    Relationship status: Not on file  . Intimate partner violence:    Fear of current or ex partner: Not on file    Emotionally abused: Not on file    Physically abused: Not on file    Forced sexual activity: Not on file  Other Topics Concern  . Not on file  Social History Narrative   Works for [employer] at [employer] in [place].   Family History  Problem Relation Age of Onset  . Coronary artery disease Unknown   . Hypertension Unknown   . Stroke Unknown   . Colon cancer Neg Hx   . Esophageal cancer Neg Hx   .  Rectal cancer Neg Hx   . Stomach cancer Neg Hx    ROS: All systems reviewed and negative except as per HPI.  Current Outpatient Medications  Medication Sig Dispense Refill  . acetaminophen (TYLENOL) 500 MG tablet Take 500 mg by mouth daily as needed for moderate pain or headache.    . albuterol (PROVENTIL HFA;VENTOLIN HFA) 108 (90 Base) MCG/ACT inhaler Inhale 2 puffs into the lungs every 6 (six) hours as needed for wheezing or shortness of breath. 18 g 5  . carvedilol (COREG) 6.25 MG tablet Take 1 tablet (6.25 mg total) by mouth 2 (two) times daily with a meal. 60 tablet 3  . COMBIGAN 0.2-0.5 % ophthalmic solution Place 1 drop into both eyes every 12 (twelve) hours.     . Flaxseed, Linseed, (FLAX SEED OIL) 1000 MG CAPS Take 1,000 mg by mouth daily.    . furosemide (LASIX) 40 MG tablet Take 1 tablet (40 mg total) by mouth every morning AND 0.5 tablets (20 mg  total) every morning. (Patient taking differently: Take 40 mg by mouth every moning and 20 mg every evening) 45 tablet 3  . loratadine (CLARITIN) 10 MG tablet Take 10 mg by mouth daily as needed for allergies.    . meloxicam (MOBIC) 7.5 MG tablet TAKE 1 TABLET BY MOUTH DAILY 90 tablet 0  . Multiple Vitamin (MULTIVITAMIN WITH MINERALS) TABS tablet Take 2-3 tablets by mouth See admin instructions. Take 3 tablets in the morning and 2 tablets in the evening    . ranitidine (ZANTAC) 150 MG tablet Take 150 mg by mouth daily as needed for heartburn.    . rosuvastatin (CRESTOR) 10 MG tablet Take 1 tablet (10 mg total) by mouth daily. (Patient taking differently: Take 10 mg by mouth at bedtime. ) 90 tablet 3  . sacubitril-valsartan (ENTRESTO) 24-26 MG Take 1 tablet by mouth 2 (two) times daily. 60 tablet 3  . TRAVATAN Z 0.004 % ophthalmic solution Place 1 drop into both eyes at bedtime.     Marland Kitchen aspirin EC 81 MG tablet Take 1 tablet (81 mg total) by mouth daily. 90 tablet 3  . spironolactone (ALDACTONE) 25 MG tablet Take 0.5 tablets (12.5 mg total) by mouth daily. 45 tablet 3   No current facility-administered medications for this encounter.    BP 140/72   Pulse 69   Wt 292 lb 12.8 oz (132.8 kg)   SpO2 97%   BMI 35.64 kg/m  General: NAD Neck: No JVD, no thyromegaly or thyroid nodule.  Lungs: Clear to auscultation bilaterally with normal respiratory effort. CV: Nondisplaced PMI.  Heart regular S1/S2, no S3/S4, no murmur.  No peripheral edema.  No carotid bruit.  Normal pedal pulses.  Abdomen: Soft, nontender, no hepatosplenomegaly, no distention.  Skin: Intact without lesions or rashes.  Neurologic: Alert and oriented x 3.  Psych: Normal affect. Extremities: No clubbing or cyanosis.  HEENT: Normal.   Assessment/Plan: 1. Chronic systolic CHF: Echo in 3/26 with EF 35-40%, mild LVH. LHC in 3/19 with nonobstructive CAD.  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  He looks euvolemic, NYHA class II symptoms now.  - Will send myeloma panel and Tc-PYP scan.  - Cardiac MRI to look for infiltrative disease.  - Increase Coreg to 6.25 mg bid.  - Continue Entresto 24/26 bid.  - Add spironolactone 12.5 mg daily.  BMET today and again in 10 days.  - Continue  Lasix 40 qam/20 qpm.   2. HTN: BP still borderline high, med changes as above.   Followup 1 month with HF pharmacist for med titration, see me in 2 months.   Loralie Champagne 02/27/2018

## 2018-03-02 LAB — MULTIPLE MYELOMA PANEL, SERUM
ALBUMIN SERPL ELPH-MCNC: 2.9 g/dL (ref 2.9–4.4)
ALPHA 1: 0.2 g/dL (ref 0.0–0.4)
ALPHA2 GLOB SERPL ELPH-MCNC: 0.5 g/dL (ref 0.4–1.0)
Albumin/Glob SerPl: 1.3 (ref 0.7–1.7)
B-GLOBULIN SERPL ELPH-MCNC: 0.8 g/dL (ref 0.7–1.3)
Gamma Glob SerPl Elph-Mcnc: 0.7 g/dL (ref 0.4–1.8)
Globulin, Total: 2.3 g/dL (ref 2.2–3.9)
IGG (IMMUNOGLOBIN G), SERUM: 1066 mg/dL (ref 700–1600)
IgA: 320 mg/dL (ref 61–437)
IgM (Immunoglobulin M), Srm: 111 mg/dL (ref 15–143)
TOTAL PROTEIN ELP: 5.2 g/dL — AB (ref 6.0–8.5)

## 2018-03-08 ENCOUNTER — Encounter (HOSPITAL_COMMUNITY)
Admission: RE | Admit: 2018-03-08 | Discharge: 2018-03-08 | Disposition: A | Discharge status: Home / Self Care | Payer: Medicare Other | Source: Ambulatory Visit | Attending: Cardiology | Admitting: Cardiology

## 2018-03-08 ENCOUNTER — Ambulatory Visit (HOSPITAL_COMMUNITY)
Admission: RE | Admit: 2018-03-08 | Discharge: 2018-03-08 | Disposition: A | Discharge status: Home / Self Care | Payer: Medicare Other | Source: Ambulatory Visit | Attending: Internal Medicine | Admitting: Internal Medicine

## 2018-03-08 ENCOUNTER — Ambulatory Visit (HOSPITAL_COMMUNITY)
Admission: RE | Admit: 2018-03-08 | Discharge: 2018-03-08 | Disposition: A | Discharge status: Home / Self Care | Payer: Medicare Other | Source: Ambulatory Visit | Attending: Cardiology | Admitting: Cardiology

## 2018-03-08 ENCOUNTER — Encounter (HOSPITAL_COMMUNITY): Payer: Medicare Other

## 2018-03-08 DIAGNOSIS — E8589 Other amyloidosis: Secondary | ICD-10-CM

## 2018-03-08 DIAGNOSIS — R9439 Abnormal result of other cardiovascular function study: Secondary | ICD-10-CM | POA: Diagnosis not present

## 2018-03-08 DIAGNOSIS — I509 Heart failure, unspecified: Secondary | ICD-10-CM | POA: Diagnosis not present

## 2018-03-08 DIAGNOSIS — I429 Cardiomyopathy, unspecified: Secondary | ICD-10-CM | POA: Diagnosis not present

## 2018-03-08 DIAGNOSIS — E859 Amyloidosis, unspecified: Secondary | ICD-10-CM

## 2018-03-08 DIAGNOSIS — I5022 Chronic systolic (congestive) heart failure: Secondary | ICD-10-CM

## 2018-03-08 LAB — BASIC METABOLIC PANEL
Anion gap: 6 (ref 5–15)
BUN: 11 mg/dL (ref 6–20)
CHLORIDE: 103 mmol/L (ref 101–111)
CO2: 28 mmol/L (ref 22–32)
CREATININE: 1.06 mg/dL (ref 0.61–1.24)
Calcium: 9.1 mg/dL (ref 8.9–10.3)
GFR calc non Af Amer: >60 mL/min (ref >60)
Glucose, Bld: 213 mg/dL — ABNORMAL HIGH (ref 65–99)
Potassium: 4.9 mmol/L (ref 3.5–5.1)
Sodium: 137 mmol/L (ref 135–145)

## 2018-03-08 MED ORDER — GADOBENATE DIMEGLUMINE 529 MG/ML IV SOLN
38.0000 mL | Freq: Once | INTRAVENOUS | Status: AC | PRN
  Administered 2018-03-08: 38 mL via INTRAVENOUS

## 2018-03-08 MED ORDER — TECHNETIUM TC 99M PYROPHOSPHATE
20.0000 | Freq: Once | INTRAVENOUS | Status: AC
  Administered 2018-03-08: 20 via INTRAVENOUS
  Filled 2018-03-08: qty 20

## 2018-03-13 DIAGNOSIS — C61 Malignant neoplasm of prostate: Secondary | ICD-10-CM | POA: Diagnosis not present

## 2018-03-20 DIAGNOSIS — N393 Stress incontinence (female) (male): Secondary | ICD-10-CM | POA: Diagnosis not present

## 2018-03-20 DIAGNOSIS — N3000 Acute cystitis without hematuria: Secondary | ICD-10-CM | POA: Diagnosis not present

## 2018-03-20 DIAGNOSIS — C61 Malignant neoplasm of prostate: Secondary | ICD-10-CM | POA: Diagnosis not present

## 2018-03-20 DIAGNOSIS — N5201 Erectile dysfunction due to arterial insufficiency: Secondary | ICD-10-CM | POA: Diagnosis not present

## 2018-03-27 ENCOUNTER — Ambulatory Visit (HOSPITAL_COMMUNITY)
Admission: RE | Admit: 2018-03-27 | Discharge: 2018-03-27 | Disposition: A | Discharge status: Home / Self Care | Payer: Medicare Other | Source: Ambulatory Visit | Attending: Internal Medicine | Admitting: Internal Medicine

## 2018-03-27 VITALS — BP 152/74 | HR 52 | Wt 289.0 lb

## 2018-03-27 DIAGNOSIS — I110 Hypertensive heart disease with heart failure: Secondary | ICD-10-CM | POA: Insufficient documentation

## 2018-03-27 DIAGNOSIS — M5440 Lumbago with sciatica, unspecified side: Secondary | ICD-10-CM | POA: Insufficient documentation

## 2018-03-27 DIAGNOSIS — I5022 Chronic systolic (congestive) heart failure: Secondary | ICD-10-CM | POA: Diagnosis not present

## 2018-03-27 DIAGNOSIS — Z79899 Other long term (current) drug therapy: Secondary | ICD-10-CM | POA: Diagnosis not present

## 2018-03-27 DIAGNOSIS — I10 Essential (primary) hypertension: Secondary | ICD-10-CM | POA: Diagnosis present

## 2018-03-27 NOTE — Patient Instructions (Signed)
Continue current medication regimen Blood work today - We will call you with any changes Next appointment with Pharmacist Wed 5/15 at 2pm

## 2018-03-27 NOTE — Progress Notes (Signed)
PCP: Dr. Jenny Reichmann Cardiology: Dr. Gwenlyn Found HF Cardiology: Dr. Aundra Dubin    HPI:  77 y.o. with history of HTN and cardiomyopathy of uncertain etiology was referred by Dr. Gwenlyn Found for evaluation of dyspnea and CHF.  Patient was first found to have decreased systolic function in 8/31 when echo showed EF 45-50%. At that time, he had mild exertional dyspnea. Last summer, he had gotten to the point where he had to take frequent breaks mowing the lawn.  However, since the beginning of this year, he has had worsening dyspnea.  Echo was done in 1/19, showing EF down to 35-40%.  Cardiolite did not show a definite perfusion defect. 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   He returns for his first visit with pharmacist led medication titration of CHF.  At last visit on 02/27/2018 Dr. Aundra Dubin increased carvedilol to 6.25mg  BID and added spironolactone 12.5mg  daily.  His follow up lab showed normal renal function Cr 1 and slight bump in potassium 4.1>4.9.  He is feeling better since starting on Entresto.  Breathing better, and weight has decreased.  Rides his stationary bike for a mile daily.  Main complaint is low back pain/sciatica, worse when walking.  Hard to get to the mailbox and back due to back pain   . Shortness of breath/dyspnea on exertion? no - except if walks quickly to mailbox and back - can ride stationary bike no problems . Orthopnea/PND? no . Edema? no . Lightheadedness/dizziness? Yes - occassionally . Daily weights at home? Yes stable 281lb without clothes or shoes . Blood pressure/heart rate monitoring at home? Yes but he doesn't feel accurate will bring to next visit to check  . Following low-sodium/fluid-restricted diet? yes  HF Medications: Carvedilol 6.25mg  BID Entresto 24-26mg   BID Furosemide 40mg  in am Spironolactone 12.5mg  daily   Has the patient been experiencing any side effects to the medications prescribed?  no  Does the patient have any problems obtaining medications due to  transportation or finances?   no  Understanding of regimen: good Understanding of indications: good Potential of compliance: good Patient understands to avoid NSAIDs - takes meloxicam for back pain once daily but avoids others Patient understands to avoid decongestants.    Pertinent Lab Values: last 03/08/2018 . Serum creatinine 1, CO2 28, Potassium 4.9  Sodium 137, BNP 33 last 3/19,  . Today's labs   Vital Signs: . Weight: 289lb (lower than last clinic visit 295lb)  . Blood pressure: 152/74 . Heart rate: 52   Assessment: 1. Chronicsystolic CHF (EF 51-76%), per ECHO 1/60 due to uncertain etiology. NYHA class IIsymptoms.  - Volume status stable  - With recent increased lightheadedness and increase in potassium will continue   Carvedilol 6.25mg  BID, Entresto 24-26mg   BID, Furosemide 40mg  in am, Spironolactone 12.5mg  daily - Basic disease state pathophysiology, medication indication, mechanism and side effects reviewed at length with patient and he verbalized understanding  2. HTN: BP remains elevated over goal < 130/80 - no changes as above   Plan: 1) Medication changes: Based on clinical presentation, vital signs will recheck labs prior to increasing spironolactone.  At follow up visit will either increase spironolactone or add Bidil for improved BP control and morbidity and mortality benefit in AA.  2) Labs: Bmet today - lab never recieved blood - will recheck at next visit   3) Follow-up: with Pharmacist clinic 1 week 04/05/18 and with  Dr. Aundra Dubin 05/02/2018   Bonnita Nasuti Pharm.D. CPP, BCPS Clinical Pharmacist [phone] 03/27/2018 2:01 PM

## 2018-04-05 ENCOUNTER — Ambulatory Visit (HOSPITAL_COMMUNITY)
Admission: RE | Admit: 2018-04-05 | Discharge: 2018-04-05 | Disposition: A | Discharge status: Home / Self Care | Payer: Medicare Other | Source: Ambulatory Visit | Attending: Cardiology | Admitting: Cardiology

## 2018-04-05 VITALS — BP 132/70 | HR 54 | Wt 291.0 lb

## 2018-04-05 DIAGNOSIS — I5022 Chronic systolic (congestive) heart failure: Secondary | ICD-10-CM | POA: Diagnosis not present

## 2018-04-05 DIAGNOSIS — I110 Hypertensive heart disease with heart failure: Secondary | ICD-10-CM | POA: Diagnosis not present

## 2018-04-05 LAB — BASIC METABOLIC PANEL
ANION GAP: 7 (ref 5–15)
BUN: 11 mg/dL (ref 6–20)
CALCIUM: 8.9 mg/dL (ref 8.9–10.3)
CO2: 30 mmol/L (ref 22–32)
Chloride: 104 mmol/L (ref 101–111)
Creatinine, Ser: 1.07 mg/dL (ref 0.61–1.24)
GFR calc non Af Amer: >60 mL/min (ref >60)
Glucose, Bld: 120 mg/dL — ABNORMAL HIGH (ref 65–99)
Potassium: 4.6 mmol/L (ref 3.5–5.1)
Sodium: 141 mmol/L (ref 135–145)

## 2018-04-05 MED ORDER — SPIRONOLACTONE 25 MG PO TABS: 25.0000 mg | ORAL_TABLET | Freq: Every day | ORAL | 3 refills | Status: DC

## 2018-04-05 NOTE — Patient Instructions (Addendum)
Continue low salt diet Continue current medicines Carvedilol 6.25mg  BID Entresto 24-26mg   BID Furosemide 40mg  in am  Increase Spironolactone 25mg  Daily Follow Up lab appointment next week Wed 5/22 at 915  Follow up appointment with Dr. Tomasita Morrow June 11 at 10:40

## 2018-04-05 NOTE — Progress Notes (Signed)
PCP: Dr. Jenny Reichmann Cardiology: Dr. Gwenlyn Found HF Cardiology: Dr. Aundra Dubin   HPI:   77 y.o.with history of HTN and cardiomyopathy of uncertain etiology was referred by Dr. Gwenlyn Found for evaluation of dyspnea and CHF. Patient was first found to have decreased systolic function in 7/06 when echo showed EF 45-50%. At that time, he had mild exertional dyspnea. Last summer, he had gotten to the point where he had to take frequent breaks mowing the lawn. However, since the beginning of this year, he has had worsening dyspnea.Echo was done in 1/19, showing EF down to 35-40%. Cardiolite did not show a definite perfusion defect. 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   He returns for his  visit with pharmacist led medication titration of CHF.  At last visit discussed increase spironolactone or add Bidil.  Labs drawn but lost so no medication changes at that time. Previous labs showed normal renal function Cr 1 and slight bump in potassium 4.1>4.9.  He is feeling better since starting on Entresto. Breathing better, and weight has decreased. He does get a little lightheaded right after he takes it but he sits for an hour after he takes it and it improves.He rides his stationary bike 3 times a week for a mile daily. Main complaint is low back pain/sciatica, worse when walking. Hard to get to the mailbox and back due to back pain      . Shortness of breath/dyspnea on exertion? no - unless walks quickly . Orthopnea/PND? no . Edema? no . Lightheadedness/dizziness? Yes - right after Entresto but goes away . Daily weights at home? yes . Blood pressure/heart rate monitoring at home? yes . Following low-sodium/fluid-restricted diet? No - does not add salt to food but does eat salty foods - crackers etc  HF Medications: Carvedilol 6.25mg  BID Entresto 24-26mg   BID Furosemide 40mg  in am Spironolactone 12.5mg  daily   Has the patient been experiencing any side effects to the medications prescribed?  no  Does the  patient have any problems obtaining medications due to transportation or finances?   no  Understanding of regimen: good Understanding of indications: good Potential of compliance: good Patient understands to avoid NSAIDs - except for daily low dose meloxicam for hip pain Patient understands to avoid decongestants.    Pertinent Lab Values: Serum creatinine 1 stable, CO2 30, Potassium 4.6 slightly hemolyzed, Sodium 141,   Vital Signs: . Weight: 291lb (dry weight: 288lb at home) . Blood pressure: 132/70 . Heart rate: 54  Assessment: 1. Chronicsystolic CHF (EF 23%), due to NICM. NYHA class IIsymptoms. -Volume status stable -With lightheadedness after Entresto dose will not increase this at this time. Discussed possibly adding Bidil but he is refusing additional medications - spent lots of visit reinforcing the need for each current medication.   -Stable potassium at 4.6 and Cr 1, and stable BP will increase Spironolactone 25mg  daily  - Continue Carvedilol 6.25mg  bid, Entresto 24-26mg  BID, Furosemide 40mg  daily,  - Basic disease state pathophysiology, medication indication, mechanism and side effects reviewed at length with patient and he verbalized understanding  2. HTN:BP remains elevated over goal < 130/80 - may improved with increasing spironolactone    Plan: 1) Medication changes: Based on clinical presentation, vital signs and recent labs - will increase Spironolactone 25mg  daily 2) Labs: Follow up BMET in 1 week 3) Follow-up: with Dr. Aundra Dubin 05/02/2018   Bonnita Nasuti Pharm.D. CPP, BCPS Clinical Pharmacist [phone] 04/05/2018 3:37 PM

## 2018-04-12 ENCOUNTER — Ambulatory Visit (HOSPITAL_COMMUNITY)
Admission: RE | Admit: 2018-04-12 | Discharge: 2018-04-12 | Disposition: A | Discharge status: Home / Self Care | Payer: Medicare Other | Source: Ambulatory Visit | Attending: Internal Medicine | Admitting: Internal Medicine

## 2018-04-12 DIAGNOSIS — I5022 Chronic systolic (congestive) heart failure: Secondary | ICD-10-CM | POA: Diagnosis not present

## 2018-04-12 LAB — BASIC METABOLIC PANEL
Anion gap: 6 (ref 5–15)
BUN: 9 mg/dL (ref 6–20)
CHLORIDE: 103 mmol/L (ref 101–111)
CO2: 29 mmol/L (ref 22–32)
CREATININE: 1.06 mg/dL (ref 0.61–1.24)
Calcium: 9.1 mg/dL (ref 8.9–10.3)
GFR calc non Af Amer: >60 mL/min (ref >60)
Glucose, Bld: 160 mg/dL — ABNORMAL HIGH (ref 65–99)
Potassium: 4.6 mmol/L (ref 3.5–5.1)
Sodium: 138 mmol/L (ref 135–145)

## 2018-04-18 DIAGNOSIS — N3000 Acute cystitis without hematuria: Secondary | ICD-10-CM | POA: Diagnosis not present

## 2018-04-18 DIAGNOSIS — N3946 Mixed incontinence: Secondary | ICD-10-CM | POA: Diagnosis not present

## 2018-04-26 ENCOUNTER — Other Ambulatory Visit: Payer: Self-pay | Admitting: Internal Medicine

## 2018-04-28 ENCOUNTER — Other Ambulatory Visit: Payer: Self-pay | Admitting: Internal Medicine

## 2018-05-02 ENCOUNTER — Other Ambulatory Visit: Payer: Self-pay

## 2018-05-02 ENCOUNTER — Ambulatory Visit (HOSPITAL_COMMUNITY)
Admission: RE | Admit: 2018-05-02 | Discharge: 2018-05-02 | Disposition: A | Discharge status: Home / Self Care | Payer: Medicare Other | Source: Ambulatory Visit | Attending: Cardiology | Admitting: Cardiology

## 2018-05-02 ENCOUNTER — Encounter (HOSPITAL_COMMUNITY): Payer: Self-pay | Admitting: Cardiology

## 2018-05-02 VITALS — BP 126/60 | HR 56 | Wt 288.5 lb

## 2018-05-02 DIAGNOSIS — Z7982 Long term (current) use of aspirin: Secondary | ICD-10-CM | POA: Insufficient documentation

## 2018-05-02 DIAGNOSIS — I2510 Atherosclerotic heart disease of native coronary artery without angina pectoris: Secondary | ICD-10-CM | POA: Insufficient documentation

## 2018-05-02 DIAGNOSIS — Z8249 Family history of ischemic heart disease and other diseases of the circulatory system: Secondary | ICD-10-CM | POA: Insufficient documentation

## 2018-05-02 DIAGNOSIS — E785 Hyperlipidemia, unspecified: Secondary | ICD-10-CM | POA: Insufficient documentation

## 2018-05-02 DIAGNOSIS — I110 Hypertensive heart disease with heart failure: Secondary | ICD-10-CM | POA: Diagnosis not present

## 2018-05-02 DIAGNOSIS — Z87891 Personal history of nicotine dependence: Secondary | ICD-10-CM | POA: Diagnosis not present

## 2018-05-02 DIAGNOSIS — I5022 Chronic systolic (congestive) heart failure: Secondary | ICD-10-CM

## 2018-05-02 DIAGNOSIS — Z79899 Other long term (current) drug therapy: Secondary | ICD-10-CM | POA: Diagnosis not present

## 2018-05-02 LAB — BASIC METABOLIC PANEL
ANION GAP: 6 (ref 5–15)
BUN: 11 mg/dL (ref 6–20)
CHLORIDE: 106 mmol/L (ref 101–111)
CO2: 29 mmol/L (ref 22–32)
Calcium: 9.3 mg/dL (ref 8.9–10.3)
Creatinine, Ser: 1.15 mg/dL (ref 0.61–1.24)
GFR, EST NON AFRICAN AMERICAN: 59 mL/min — AB (ref >60)
Glucose, Bld: 136 mg/dL — ABNORMAL HIGH (ref 65–99)
POTASSIUM: 4 mmol/L (ref 3.5–5.1)
Sodium: 141 mmol/L (ref 135–145)

## 2018-05-02 LAB — LIPID PANEL
CHOL/HDL RATIO: 2.5 ratio
CHOLESTEROL: 104 mg/dL (ref 0–200)
HDL: 42 mg/dL (ref >40)
LDL Cholesterol: 48 mg/dL (ref 0–99)
Triglycerides: 69 mg/dL (ref <150)
VLDL: 14 mg/dL (ref 0–40)

## 2018-05-02 NOTE — Progress Notes (Signed)
PCP: Dr. Jenny Reichmann Cardiology: Dr. Gwenlyn Found HF Cardiology: Dr. Aundra Dubin  77 y.o. with history of HTN and cardiomyopathy of uncertain etiology was referred by Dr. Gwenlyn Found for evaluation of dyspnea and CHF.  Patient was first found to have decreased systolic function in 6/83 when echo showed EF 45-50%. At that time, he had mild exertional dyspnea. Last summer, he had gotten to the point where he had to take frequent breaks mowing the lawn.  However, since the beginning of this year, he has had worsening dyspnea.  Echo was done in 1/19, showing EF down to 35-40%.  Cardiolite did not show a definite perfusion defect. 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 Cardiac MRI in 4/19 showed EF 46%, LGE pattern that was concerning for myocarditis. PYP scan was negative.   He returns for followup of CHF.  He feels good overall. Main complaint is chronic low back pain. No dyspnea walking on flat ground.  He is short of breath walking up a hill.  No orthopnea/PND.  No chest pain. No palpitations.   Labs (12/18): LDL 47 Labs (3/19): K 4.3, creatinine 1.09 Labs (4/19): Myeloma pattern negative Labs (5/19): K 4.6, creatinine 1.06  PMH: 1. Low back pain 2. Cardiomyopathy: LHC in 2006 with normal coronaries.  Echo in 1/17 with EF 45-50%.  - Echo (1/19): EF 35-40%, mild LVH - Cardiolite (2/19): EF 41%, no ischemia/infarction.  - LHC (3/19): 50% ostial LAD, 50% ramus. LVEDP 12.  - Cardiac MRI (4/19): EF 46%, normal RV size and systolic function, LGE pattern suggestive of prior myocarditis.  - PYP scan (4/19): Negative, not suggestive of TTR amyloidosis. - Myeloma panel negative 3. HTN 4. Hyperlipidemia 5. CAD: LHC (3/19) with 50% ostial LAD, 50% ramus.  Social History   Socioeconomic History  . Marital status: Married    Spouse name: Not on file  . Number of children: 2  . Years of education: 69  . Highest education level: Not on file  Occupational History  . Occupation: Camera operator (Part-time)  Social Needs   . Financial resource strain: Not on file  . Food insecurity:    Worry: Not on file    Inability: Not on file  . Transportation needs:    Medical: Not on file    Non-medical: Not on file  Tobacco Use  . Smoking status: Former Smoker    Last attempt to quit: 12/02/1988    Years since quitting: 29.4  . Smokeless tobacco: Never Used  Substance and Sexual Activity  . Alcohol use: No  . Drug use: No  . Sexual activity: Not on file  Lifestyle  . Physical activity:    Days per week: Not on file    Minutes per session: Not on file  . Stress: Not on file  Relationships  . Social connections:    Talks on phone: Not on file    Gets together: Not on file    Attends religious service: Not on file    Active member of club or organization: Not on file    Attends meetings of clubs or organizations: Not on file    Relationship status: Not on file  . Intimate partner violence:    Fear of current or ex partner: Not on file    Emotionally abused: Not on file    Physically abused: Not on file    Forced sexual activity: Not on file  Other Topics Concern  . Not on file  Social History Narrative   Works for [employer] at  [employer] in [place].   Family History  Problem Relation Age of Onset  . Coronary artery disease Unknown   . Hypertension Unknown   . Stroke Unknown   . Colon cancer Neg Hx   . Esophageal cancer Neg Hx   . Rectal cancer Neg Hx   . Stomach cancer Neg Hx    ROS: All systems reviewed and negative except as per HPI.  Current Outpatient Medications  Medication Sig Dispense Refill  . acetaminophen (TYLENOL) 500 MG tablet Take 500 mg by mouth daily as needed for moderate pain or headache.    . albuterol (PROVENTIL HFA;VENTOLIN HFA) 108 (90 Base) MCG/ACT inhaler Inhale 2 puffs into the lungs every 6 (six) hours as needed for wheezing or shortness of breath. 18 g 5  . aspirin EC 81 MG tablet Take 1 tablet (81 mg total) by mouth daily. 90 tablet 3  . carvedilol (COREG) 6.25 MG  tablet Take 1 tablet (6.25 mg total) by mouth 2 (two) times daily with a meal. 60 tablet 3  . COMBIGAN 0.2-0.5 % ophthalmic solution Place 1 drop into both eyes every 12 (twelve) hours.     . furosemide (LASIX) 40 MG tablet Take 40 mg by mouth daily.    Marland Kitchen loratadine (CLARITIN) 10 MG tablet Take 10 mg by mouth daily as needed for allergies.    . meloxicam (MOBIC) 7.5 MG tablet TAKE 1 TABLET BY MOUTH DAILY 90 tablet 0  . Multiple Vitamin (MULTIVITAMIN WITH MINERALS) TABS tablet Take 2-3 tablets by mouth See admin instructions. Take 3 tablets in the morning and 2 tablets in the evening    . ranitidine (ZANTAC) 150 MG tablet Take 150 mg by mouth daily as needed for heartburn.    . rosuvastatin (CRESTOR) 10 MG tablet TAKE 1 TABLET BY MOUTH EVERY DAY 90 tablet 3  . sacubitril-valsartan (ENTRESTO) 24-26 MG Take 1 tablet by mouth 2 (two) times daily. 60 tablet 3  . spironolactone (ALDACTONE) 25 MG tablet Take 1 tablet (25 mg total) by mouth daily. 90 tablet 3  . TRAVATAN Z 0.004 % ophthalmic solution Place 1 drop into both eyes at bedtime.      No current facility-administered medications for this encounter.    BP 126/60   Pulse (!) 56   Wt 288 lb 8 oz (130.9 kg)   SpO2 100%   BMI 35.12 kg/m  General: NAD Neck: No JVD, no thyromegaly or thyroid nodule.  Lungs: Clear to auscultation bilaterally with normal respiratory effort. CV: Nondisplaced PMI.  Heart regular S1/S2, no S3/S4, no murmur.  No peripheral edema.  No carotid bruit.  Normal pedal pulses.  Abdomen: Soft, nontender, no hepatosplenomegaly, no distention.  Skin: Intact without lesions or rashes.  Neurologic: Alert and oriented x 3.  Psych: Normal affect. Extremities: No clubbing or cyanosis.  HEENT: Normal.   Assessment/Plan: 1. Chronic systolic CHF: Echo in 2/42 with EF 35-40%, mild LVH. LHC in 3/19 with nonobstructive CAD.  Cardiac MRI in 4/19 showed EF 46%, LGE pattern concerning for prior myocarditis. PYP scan was negative, not  suggestive of TTR amyloidosis. Myeloma panel negative.  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 On exam, he is not volume overloaded and weight is down.  NYHA class II symptoms.  - Continue Coreg 6.25 mg bid.  - Continue Entresto 24/26 bid.  - Continue spironolactone 25 mg daily.  - Continue Lasix 40 daily.  BMET today.  2. HTN: BP controlled.  3. Hyperlipidemia.  Continue Crestor, check  lipids.   Followup 4 months.   Loralie Champagne 05/02/2018

## 2018-05-02 NOTE — Patient Instructions (Signed)
Labs drawn today (if we do not call you, then your lab work was stable)   Your physician recommends that you schedule a follow-up appointment in: 4 months with Dr. McLean    

## 2018-05-10 ENCOUNTER — Encounter: Payer: Self-pay | Admitting: Internal Medicine

## 2018-05-10 ENCOUNTER — Ambulatory Visit (INDEPENDENT_AMBULATORY_CARE_PROVIDER_SITE_OTHER): Payer: Medicare Other | Admitting: Internal Medicine

## 2018-05-10 ENCOUNTER — Other Ambulatory Visit (INDEPENDENT_AMBULATORY_CARE_PROVIDER_SITE_OTHER): Payer: Medicare Other

## 2018-05-10 VITALS — BP 134/82 | HR 63 | Temp 98.2°F | Ht 76.0 in | Wt 288.0 lb

## 2018-05-10 DIAGNOSIS — R7302 Impaired glucose tolerance (oral): Secondary | ICD-10-CM | POA: Diagnosis not present

## 2018-05-10 DIAGNOSIS — I10 Essential (primary) hypertension: Secondary | ICD-10-CM

## 2018-05-10 DIAGNOSIS — N390 Urinary tract infection, site not specified: Secondary | ICD-10-CM | POA: Insufficient documentation

## 2018-05-10 LAB — URINALYSIS, ROUTINE W REFLEX MICROSCOPIC
Bilirubin Urine: NEGATIVE
HGB URINE DIPSTICK: NEGATIVE
Ketones, ur: NEGATIVE
Nitrite: POSITIVE — AB
RBC / HPF: NONE SEEN (ref 0–?)
Specific Gravity, Urine: 1.025 (ref 1.000–1.030)
TOTAL PROTEIN, URINE-UPE24: NEGATIVE
Urine Glucose: NEGATIVE
Urobilinogen, UA: 0.2 (ref 0.0–1.0)
pH: 5.5 (ref 5.0–8.0)

## 2018-05-10 LAB — HEMOGLOBIN A1C: HEMOGLOBIN A1C: 5.9 % (ref 4.6–6.5)

## 2018-05-10 NOTE — Assessment & Plan Note (Signed)
stable overall by history and exam, recent data reviewed with pt, and pt to continue medical treatment as before,  to f/u any worsening symptoms or concerns  

## 2018-05-10 NOTE — Assessment & Plan Note (Signed)
S/p recent tx per urology, pt asks for f/u urine studies as had no symptoms to start

## 2018-05-10 NOTE — Progress Notes (Signed)
Subjective:    Patient ID: Dylan Benton, male    DOB: [birth year], 77 y.o.   MRN: [id number]  HPI  Here to f/u; overall doing ok,.  Pt denies new neurological symptoms such as new headache, or facial or extremity weakness or numbness.  Pt denies polydipsia, polyuria, or low sugar episode.  Pt states overall good compliance with meds, mostly trying to follow appropriate diet, with wt overall stable,  but little exercise however  Has been seeing cardiology regularly with lab testing. Pt denies chest pain, increased sob or doe, wheezing, orthopnea, PND, increased LE swelling, palpitations, dizziness or syncope.  Pt continues to have recurring LBP without change in severity, bowel or bladder change, fever, wt loss,  worsening LE pain/numbness/weakness, gait change or falls, des[pite bilat sciatica, walks with 4 prong cane.   Had recent antibiotics per urlogy for UTI - Denies urinary symptoms such as dysuria, frequency, urgency, flank pain, hematuria or n/v, fever, chills. Past Medical History:  Diagnosis Date  . Allergy   . Anemia, unspecified    NOS ?resolved  . Anxiety   . Aortic root dilation (HCC)   . Arthritis   . ASTHMA, UNSPECIFIED, UNSPECIFIED STATUS 08/06/2009   Annotation: No exacerbations.  Qualifier: Diagnosis of  By: Kelton Pillar MD, [place]    . Bladder calculus 05/05/2012  . Cataract   . Chest pain   . ECHOCARDIOGRAM, ABNORMAL 12/26/2006    Aortic root dilation  This problem surfaced in 2006 after a cardiology referral for chest pain.  He was seen by Dr. Haroldine Laws in January o6 and cathed on Jan. 18.  Coronaries were nl and EF was 65%.  Echo showed mild aortic root dilation of 53mm. He was started on metoprolol (although I notice that he is no longer on this.  Annual echo to follow aortic root was advised.  Aortic root dimension was unchanged on studies in 2009 and 2010.  Will continue to follow this, perhaps not every year as it seems stable.   . Erectile dysfunction 05/05/2012  . GERD  (gastroesophageal reflux disease) 05/05/2012  . H pylori ulcer    teated H pylori  . History of back surgery   . Hyperlipidemia 05/08/2012  . Hypertension   . Impaired glucose tolerance 05/05/2012  . Lumbar disc disease 05/08/2012  . Male stress incontinence 05/05/2012  . Myocardial infarction [facility])    pt states, "i've been told I have had two heart attacks  . Obesity   . OBESITY 10/06/2006   Qualifier: Diagnosis of  By: Stann Mainland MD, Nicole Kindred    . Prostate cancer ([place])    Hx of   . PROSTATE CANCER, HX OF 08/06/2009   Annotation: prostatectomy and radiotherapy in 1990s,  Dr. Jeffie Pollock Qualifier: Diagnosis of  By: Kelton Pillar MD, [place]    . PUD (peptic ulcer disease) 05/05/2012  . Systolic CHF ([place]) 2/84/1324  . Thoracic aortic aneurysm ([place])   . 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 Edilia Bo Qualifier: Diagnosis of  By: Kelton Pillar MD, Mad[place]     Past Surgical History:  Procedure Laterality Date  . BACK SURGERY    . ear surgury    . eye surgury    . LEFT HEART CATH AND CORONARY ANGIOGRAPHY N/A 02/06/2018   Procedure: LEFT HEART CATH AND CORONARY ANGIOGRAPHY;  Surgeon: Larey Dresser, MD;  Location: [facility];  Service: Cardiovascular;  Laterality: N/A;  . PROSTATECTOMY      reports that he quit smoking  about 29 years ago. He has never used smokeless tobacco. He reports that he does not drink alcohol or use drugs. family history includes Coronary artery disease in his unknown relative; Hypertension in his unknown relative; Stroke in his unknown relative. No Known Allergies Current Outpatient Medications on File Prior to Visit  Medication Sig Dispense Refill  . acetaminophen (TYLENOL) 500 MG tablet Take 500 mg by mouth daily as needed for moderate pain or headache.    . albuterol (PROVENTIL HFA;VENTOLIN HFA) 108 (90 Base) MCG/ACT inhaler Inhale 2 puffs into the lungs every 6 (six) hours as needed for wheezing or shortness of breath. 18 g 5  .  aspirin EC 81 MG tablet Take 1 tablet (81 mg total) by mouth daily. 90 tablet 3  . carvedilol (COREG) 6.25 MG tablet Take 1 tablet (6.25 mg total) by mouth 2 (two) times daily with a meal. 60 tablet 3  . COMBIGAN 0.2-0.5 % ophthalmic solution Place 1 drop into both eyes every 12 (twelve) hours.     . furosemide (LASIX) 40 MG tablet Take 40 mg by mouth daily.    Marland Kitchen loratadine (CLARITIN) 10 MG tablet Take 10 mg by mouth daily as needed for allergies.    . meloxicam (MOBIC) 7.5 MG tablet TAKE 1 TABLET BY MOUTH DAILY 90 tablet 0  . Multiple Vitamin (MULTIVITAMIN WITH MINERALS) TABS tablet Take 2-3 tablets by mouth See admin instructions. Take 3 tablets in the morning and 2 tablets in the evening    . ranitidine (ZANTAC) 150 MG tablet Take 150 mg by mouth daily as needed for heartburn.    . rosuvastatin (CRESTOR) 10 MG tablet TAKE 1 TABLET BY MOUTH EVERY DAY 90 tablet 3  . sacubitril-valsartan (ENTRESTO) 24-26 MG Take 1 tablet by mouth 2 (two) times daily. 60 tablet 3  . spironolactone (ALDACTONE) 25 MG tablet Take 1 tablet (25 mg total) by mouth daily. 90 tablet 3  . TRAVATAN Z 0.004 % ophthalmic solution Place 1 drop into both eyes at bedtime.      No current facility-administered medications on file prior to visit.     Review of Systems  Constitutional: Negative for other unusual diaphoresis or sweats HENT: Negative for ear discharge or swelling Eyes: Negative for other worsening visual disturbances Respiratory: Negative for stridor or other swelling  Gastrointestinal: Negative for worsening distension or other blood Genitourinary: Negative for retention or other urinary change Musculoskeletal: Negative for other MSK pain or swelling Skin: Negative for color change or other new lesions Neurological: Negative for worsening tremors and other numbness  Psychiatric/Behavioral: Negative for worsening agitation or other fatigue All other system neg per pt    Objective:   Physical Exam BP 134/82    Pulse 63   Temp 98.2 F (36.8 C) (Oral)   Ht 6\' 4"  (1.93 m)   Wt 288 lb (130.6 kg)   SpO2 97%   BMI 35.06 kg/m  VS noted,  Constitutional: Pt appears in NAD HENT: Head: NCAT.  Right Ear: External ear normal.  Left Ear: External ear normal.  Eyes: . Pupils are equal, round, and reactive to light. Conjunctivae and EOM are normal Nose: without d/c or deformity Neck: Neck supple. Gross normal ROM Cardiovascular: Normal rate and regular rhythm.   Pulmonary/Chest: Effort normal and breath sounds without rales or wheezing.  Abd:  Soft, NT, ND, + BS, no organomegaly Neurological: Pt is alert. At baseline orientation, motor grossly intact Skin: Skin is warm. No rashes, other new lesions, no LE  edema Psychiatric: Pt behavior is normal without agitation  No other exam findings Lab Results  Component Value Date   WBC 3.6 (L) 01/30/2018   HGB 13.8 01/30/2018   HCT 40.7 01/30/2018   PLT 149 (L) 01/30/2018   GLUCOSE 136 (H) 05/02/2018   CHOL 104 05/02/2018   TRIG 69 05/02/2018   HDL 42 05/02/2018   LDLCALC 48 05/02/2018   ALT 12 11/09/2017   AST 18 11/09/2017   NA 141 05/02/2018   K 4.0 05/02/2018   CL 106 05/02/2018   CREATININE 1.15 05/02/2018   BUN 11 05/02/2018   CO2 29 05/02/2018   TSH 1.89 11/09/2017   PSA 1.65 11/09/2017   INR 1.09 01/30/2018   HGBA1C 6.2 11/09/2017          Assessment & Plan:

## 2018-05-10 NOTE — Patient Instructions (Signed)

## 2018-05-12 ENCOUNTER — Telehealth: Payer: Self-pay

## 2018-05-12 ENCOUNTER — Other Ambulatory Visit: Payer: Self-pay | Admitting: Internal Medicine

## 2018-05-12 LAB — URINE CULTURE
MICRO NUMBER:: 90734121
SPECIMEN QUALITY:: ADEQUATE

## 2018-05-12 MED ORDER — CIPROFLOXACIN HCL 500 MG PO TABS: 500.0000 mg | ORAL_TABLET | Freq: Two times a day (BID) | ORAL | 0 refills | Status: AC

## 2018-05-12 NOTE — Telephone Encounter (Signed)
Pt has been informed and expressed understanding.  

## 2018-05-12 NOTE — Telephone Encounter (Signed)
-----   Message from Biagio Borg, MD sent at 05/12/2018  7:58 AM EDT ----- Correct   - Left message on MyChart,  The test results show that your current treatment is OK, except the urine culture is consistent with infection.  We will treat with an antibiotic.  I will send the prescription and you should hear from the office as well.    Shirron to please inform pt, I will do referral/order/rx

## 2018-05-18 DIAGNOSIS — H401133 Primary open-angle glaucoma, bilateral, severe stage: Secondary | ICD-10-CM | POA: Diagnosis not present

## 2018-05-20 ENCOUNTER — Other Ambulatory Visit (HOSPITAL_COMMUNITY): Payer: Self-pay | Admitting: Cardiology

## 2018-06-26 ENCOUNTER — Other Ambulatory Visit (INDEPENDENT_AMBULATORY_CARE_PROVIDER_SITE_OTHER): Payer: Medicare Other

## 2018-06-26 ENCOUNTER — Ambulatory Visit (INDEPENDENT_AMBULATORY_CARE_PROVIDER_SITE_OTHER): Payer: Medicare Other | Admitting: Internal Medicine

## 2018-06-26 ENCOUNTER — Encounter: Payer: Self-pay | Admitting: Internal Medicine

## 2018-06-26 VITALS — BP 126/84 | HR 58 | Temp 98.2°F | Ht 76.0 in | Wt 288.0 lb

## 2018-06-26 DIAGNOSIS — N390 Urinary tract infection, site not specified: Secondary | ICD-10-CM

## 2018-06-26 DIAGNOSIS — R7302 Impaired glucose tolerance (oral): Secondary | ICD-10-CM

## 2018-06-26 DIAGNOSIS — I10 Essential (primary) hypertension: Secondary | ICD-10-CM

## 2018-06-26 LAB — URINALYSIS, ROUTINE W REFLEX MICROSCOPIC
Bilirubin Urine: NEGATIVE
Hgb urine dipstick: NEGATIVE
KETONES UR: NEGATIVE
Leukocytes, UA: NEGATIVE
Nitrite: NEGATIVE
PH: 5.5 (ref 5.0–8.0)
RBC / HPF: NONE SEEN (ref 0–?)
SPECIFIC GRAVITY, URINE: 1.01 (ref 1.000–1.030)
Total Protein, Urine: NEGATIVE
Urine Glucose: NEGATIVE
Urobilinogen, UA: 0.2 (ref 0.0–1.0)
WBC UA: NONE SEEN (ref 0–?)

## 2018-06-26 NOTE — Progress Notes (Signed)
Subjective:    Patient ID: Dylan Benton, male    DOB: [birth year], 77 y.o.   MRN: [id number]  HPI  Here to f/u; overall doing ok,  Pt denies chest pain, increasing sob or doe, wheezing, orthopnea, PND, increased LE swelling, palpitations, dizziness or syncope.  Pt denies new neurological symptoms such as new headache, or facial or extremity weakness or numbness.  Pt denies polydipsia, polyuria, or low sugar episode.  Pt states overall good compliance with meds, mostly trying to follow appropriate diet, with wt overall stable,  but little exercise however. Denies urinary symptoms such as dysuria, frequency, urgency, flank pain, hematuria or n/v, fever, chills. Mostly here today as he would like to recheck urine for recurrence of UTI, since he had no symptoms with the 2 UTI in the past 6 wks Past Medical History:  Diagnosis Date  . Allergy   . Anemia, unspecified    NOS ?resolved  . Anxiety   . Aortic root dilation (HCC)   . Arthritis   . ASTHMA, UNSPECIFIED, UNSPECIFIED STATUS 08/06/2009   Annotation: No exacerbations.  Qualifier: Diagnosis of  By: Kelton Pillar MD, [place]    . Bladder calculus 05/05/2012  . Cataract   . Chest pain   . ECHOCARDIOGRAM, ABNORMAL 12/26/2006    Aortic root dilation  This problem surfaced in 2006 after a cardiology referral for chest pain.  He was seen by Dr. Haroldine Laws in January o6 and cathed on Jan. 18.  Coronaries were nl and EF was 65%.  Echo showed mild aortic root dilation of 63mm. He was started on metoprolol (although I notice that he is no longer on this.  Annual echo to follow aortic root was advised.  Aortic root dimension was unchanged on studies in 2009 and 2010.  Will continue to follow this, perhaps not every year as it seems stable.   . Erectile dysfunction 05/05/2012  . GERD (gastroesophageal reflux disease) 05/05/2012  . H pylori ulcer    teated H pylori  . History of back surgery   . Hyperlipidemia 05/08/2012  . Hypertension   . Impaired glucose  tolerance 05/05/2012  . Lumbar disc disease 05/08/2012  . Male stress incontinence 05/05/2012  . Myocardial infarction [facility])    pt states, "i've been told I have had two heart attacks  . Obesity   . OBESITY 10/06/2006   Qualifier: Diagnosis of  By: Stann Mainland MD, Nicole Kindred    . Prostate cancer ([place])    Hx of   . PROSTATE CANCER, HX OF 08/06/2009   Annotation: prostatectomy and radiotherapy in 1990s,  Dr. Jeffie Pollock Qualifier: Diagnosis of  By: Kelton Pillar MD, [place]    . PUD (peptic ulcer disease) 05/05/2012  . Systolic CHF ([place]) 0/53/9767  . Thoracic aortic aneurysm ([place])   . 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 Edilia Bo Qualifier: Diagnosis of  By: Kelton Pillar MD, Madhav     Past Surgical History:  Procedure Laterality Date  . BACK SURGERY    . ear surgury    . eye surgury    . LEFT HEART CATH AND CORONARY ANGIOGRAPHY N/A 02/06/2018   Procedure: LEFT HEART CATH AND CORONARY ANGIOGRAPHY;  Surgeon: Larey Dresser, MD;  Location: [facility];  Service: Cardiovascular;  Laterality: N/A;  . PROSTATECTOMY      reports that he quit smoking about 29 years ago. He has never used smokeless tobacco. He reports that he does not drink alcohol or use drugs. family history includes  Coronary artery disease in his unknown relative; Hypertension in his unknown relative; Stroke in his unknown relative. No Known Allergies Current Outpatient Medications on File Prior to Visit  Medication Sig Dispense Refill  . acetaminophen (TYLENOL) 500 MG tablet Take 500 mg by mouth daily as needed for moderate pain or headache.    . albuterol (PROVENTIL HFA;VENTOLIN HFA) 108 (90 Base) MCG/ACT inhaler Inhale 2 puffs into the lungs every 6 (six) hours as needed for wheezing or shortness of breath. 18 g 5  . aspirin EC 81 MG tablet Take 1 tablet (81 mg total) by mouth daily. 90 tablet 3  . carvedilol (COREG) 6.25 MG tablet TAKE 1 TABLET (6.25 MG TOTAL) BY MOUTH 2 (TWO) TIMES DAILY WITH A  MEAL. 180 tablet 1  . COMBIGAN 0.2-0.5 % ophthalmic solution Place 1 drop into both eyes every 12 (twelve) hours.     Marland Kitchen ENTRESTO 24-26 MG TAKE 1 TABLET BY MOUTH TWICE A DAY 180 tablet 1  . furosemide (LASIX) 40 MG tablet Take 40 mg by mouth daily.    Marland Kitchen loratadine (CLARITIN) 10 MG tablet Take 10 mg by mouth daily as needed for allergies.    . meloxicam (MOBIC) 7.5 MG tablet TAKE 1 TABLET BY MOUTH DAILY 90 tablet 0  . Multiple Vitamin (MULTIVITAMIN WITH MINERALS) TABS tablet Take 2-3 tablets by mouth See admin instructions. Take 3 tablets in the morning and 2 tablets in the evening    . ranitidine (ZANTAC) 150 MG tablet Take 150 mg by mouth daily as needed for heartburn.    . rosuvastatin (CRESTOR) 10 MG tablet TAKE 1 TABLET BY MOUTH EVERY DAY 90 tablet 3  . spironolactone (ALDACTONE) 25 MG tablet Take 1 tablet (25 mg total) by mouth daily. 90 tablet 3  . TRAVATAN Z 0.004 % ophthalmic solution Place 1 drop into both eyes at bedtime.      No current facility-administered medications on file prior to visit.    Review of Systems  Constitutional: Negative for other unusual diaphoresis or sweats HENT: Negative for ear discharge or swelling Eyes: Negative for other worsening visual disturbances Respiratory: Negative for stridor or other swelling  Gastrointestinal: Negative for worsening distension or other blood Genitourinary: Negative for retention or other urinary change Musculoskeletal: Negative for other MSK pain or swelling Skin: Negative for color change or other new lesions Neurological: Negative for worsening tremors and other numbness  Psychiatric/Behavioral: Negative for worsening agitation or other fatigue All other system neg per pt    Objective:   Physical Exam BP 126/84   Pulse (!) 58   Temp 98.2 F (36.8 C) (Oral)   Ht 6\' 4"  (1.93 m)   Wt 288 lb (130.6 kg)   SpO2 96%   BMI 35.06 kg/m  VS noted,  Constitutional: Pt appears in NAD HENT: Head: NCAT.  Right Ear: External  ear normal.  Left Ear: External ear normal.  Eyes: . Pupils are equal, round, and reactive to light. Conjunctivae and EOM are normal Nose: without d/c or deformity Neck: Neck supple. Gross normal ROM Cardiovascular: Normal rate and regular rhythm.   Pulmonary/Chest: Effort normal and breath sounds without rales or wheezing.  Abd:  Soft, NT, ND, + BS, no organomegaly Neurological: Pt is alert. At baseline orientation, motor grossly intact Skin: Skin is warm. No rashes, other new lesions, no LE edema Psychiatric: Pt behavior is normal without agitation  No other exam findings Lab Results  Component Value Date   WBC 3.6 (L) 01/30/2018  HGB 13.8 01/30/2018   HCT 40.7 01/30/2018   PLT 149 (L) 01/30/2018   GLUCOSE 136 (H) 05/02/2018   CHOL 104 05/02/2018   TRIG 69 05/02/2018   HDL 42 05/02/2018   LDLCALC 48 05/02/2018   ALT 12 11/09/2017   AST 18 11/09/2017   NA 141 05/02/2018   K 4.0 05/02/2018   CL 106 05/02/2018   CREATININE 1.15 05/02/2018   BUN 11 05/02/2018   CO2 29 05/02/2018   TSH 1.89 11/09/2017   PSA 1.65 11/09/2017   INR 1.09 01/30/2018   HGBA1C 5.9 05/10/2018       Assessment & Plan:

## 2018-06-26 NOTE — Patient Instructions (Signed)

## 2018-06-26 NOTE — Assessment & Plan Note (Signed)
stable overall by history and exam, recent data reviewed with pt, and pt to continue medical treatment as before,  to f/u any worsening symptoms or concerns Lab Results  Component Value Date   HGBA1C 5.9 05/10/2018

## 2018-06-26 NOTE — Assessment & Plan Note (Signed)
Recurrent x 2 in the past 6 wks, ok for repeat Urine studies

## 2018-06-26 NOTE — Assessment & Plan Note (Signed)
stable overall by history and exam, recent data reviewed with pt, and pt to continue medical treatment as before,  to f/u any worsening symptoms or concerns BP Readings from Last 3 Encounters:  06/26/18 126/84  05/10/18 134/82  05/02/18 126/60

## 2018-06-27 LAB — URINE CULTURE
MICRO NUMBER: 90922751
Result:: NO GROWTH
SPECIMEN QUALITY: ADEQUATE

## 2018-07-19 DIAGNOSIS — H6123 Impacted cerumen, bilateral: Secondary | ICD-10-CM | POA: Diagnosis not present

## 2018-07-21 ENCOUNTER — Other Ambulatory Visit: Payer: Self-pay | Admitting: Internal Medicine

## 2018-08-02 DIAGNOSIS — H90A31 Mixed conductive and sensorineural hearing loss, unilateral, right ear with restricted hearing on the contralateral side: Secondary | ICD-10-CM | POA: Diagnosis not present

## 2018-08-02 DIAGNOSIS — H6691 Otitis media, unspecified, right ear: Secondary | ICD-10-CM | POA: Diagnosis not present

## 2018-08-02 DIAGNOSIS — H903 Sensorineural hearing loss, bilateral: Secondary | ICD-10-CM | POA: Diagnosis not present

## 2018-08-02 DIAGNOSIS — H6063 Unspecified chronic otitis externa, bilateral: Secondary | ICD-10-CM | POA: Diagnosis not present

## 2018-08-02 DIAGNOSIS — H902 Conductive hearing loss, unspecified: Secondary | ICD-10-CM | POA: Diagnosis not present

## 2018-08-02 DIAGNOSIS — H90A22 Sensorineural hearing loss, unilateral, left ear, with restricted hearing on the contralateral side: Secondary | ICD-10-CM | POA: Diagnosis not present

## 2018-08-19 ENCOUNTER — Other Ambulatory Visit (HOSPITAL_COMMUNITY): Payer: Self-pay | Admitting: Cardiology

## 2018-08-29 ENCOUNTER — Other Ambulatory Visit (HOSPITAL_COMMUNITY): Payer: Self-pay | Admitting: Cardiology

## 2018-09-01 ENCOUNTER — Ambulatory Visit (HOSPITAL_COMMUNITY)
Admission: RE | Admit: 2018-09-01 | Discharge: 2018-09-01 | Disposition: A | Discharge status: Home / Self Care | Payer: Medicare Other | Source: Ambulatory Visit | Attending: Cardiology | Admitting: Cardiology

## 2018-09-01 VITALS — BP 144/53 | HR 55 | Wt 287.0 lb

## 2018-09-01 DIAGNOSIS — Z7982 Long term (current) use of aspirin: Secondary | ICD-10-CM | POA: Insufficient documentation

## 2018-09-01 DIAGNOSIS — Z79899 Other long term (current) drug therapy: Secondary | ICD-10-CM | POA: Insufficient documentation

## 2018-09-01 DIAGNOSIS — I110 Hypertensive heart disease with heart failure: Secondary | ICD-10-CM | POA: Diagnosis not present

## 2018-09-01 DIAGNOSIS — I10 Essential (primary) hypertension: Secondary | ICD-10-CM

## 2018-09-01 DIAGNOSIS — I2510 Atherosclerotic heart disease of native coronary artery without angina pectoris: Secondary | ICD-10-CM | POA: Insufficient documentation

## 2018-09-01 DIAGNOSIS — I428 Other cardiomyopathies: Secondary | ICD-10-CM | POA: Insufficient documentation

## 2018-09-01 DIAGNOSIS — I5022 Chronic systolic (congestive) heart failure: Secondary | ICD-10-CM | POA: Insufficient documentation

## 2018-09-01 DIAGNOSIS — Z87891 Personal history of nicotine dependence: Secondary | ICD-10-CM | POA: Diagnosis not present

## 2018-09-01 DIAGNOSIS — E785 Hyperlipidemia, unspecified: Secondary | ICD-10-CM | POA: Insufficient documentation

## 2018-09-01 LAB — CBC
HCT: 41.1 % (ref 39.0–52.0)
HEMOGLOBIN: 12.8 g/dL — AB (ref 13.0–17.0)
MCH: 30.8 pg (ref 26.0–34.0)
MCHC: 31.1 g/dL (ref 30.0–36.0)
MCV: 98.8 fL (ref 80.0–100.0)
Platelets: 170 10*3/uL (ref 150–400)
RBC: 4.16 MIL/uL — ABNORMAL LOW (ref 4.22–5.81)
RDW: 11.6 % (ref 11.5–15.5)
WBC: 3.6 10*3/uL — AB (ref 4.0–10.5)
nRBC: 0 % (ref 0.0–0.2)

## 2018-09-01 LAB — BASIC METABOLIC PANEL
Anion gap: 6 (ref 5–15)
BUN: 9 mg/dL (ref 8–23)
CALCIUM: 9 mg/dL (ref 8.9–10.3)
CO2: 28 mmol/L (ref 22–32)
CREATININE: 0.96 mg/dL (ref 0.61–1.24)
Chloride: 104 mmol/L (ref 98–111)
GFR calc Af Amer: >60 mL/min (ref >60)
GFR calc non Af Amer: >60 mL/min (ref >60)
Glucose, Bld: 116 mg/dL — ABNORMAL HIGH (ref 70–99)
POTASSIUM: 4.3 mmol/L (ref 3.5–5.1)
SODIUM: 138 mmol/L (ref 135–145)

## 2018-09-01 LAB — TSH: TSH: 1.382 u[IU]/mL (ref 0.350–4.500)

## 2018-09-01 NOTE — Patient Instructions (Signed)
Routine lab work today. Will notify you of abnormal results  Your provider requests you have a Cardiopulmonary exercise test. (CPX)   Follow up and echocardiogram in 3 months.

## 2018-09-02 NOTE — Progress Notes (Signed)
PCP: Dr. Jenny Reichmann Cardiology: Dr. Gwenlyn Found HF Cardiology: Dr. Aundra Dubin  78 y.o. with history of HTN and cardiomyopathy of uncertain etiology was referred by Dr. Gwenlyn Found for evaluation of dyspnea and CHF.  Patient was first found to have decreased systolic function in 7/86 when echo showed EF 45-50%. At that time, he had mild exertional dyspnea. Last summer, he had gotten to the point where he had to take frequent breaks mowing the lawn.  However, since the beginning of this year, he has had worsening dyspnea.  Echo was done in 1/19, showing EF down to 35-40%.  Cardiolite did not show a definite perfusion defect. 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 Cardiac MRI in 4/19 showed EF 46%, LGE pattern that was concerning for myocarditis. PYP scan was negative.   He returns for followup of CHF.  BP is high today, but SBP runs in the 120s at home.  He has been noting mild lightheadedness with standing. He is short of breath walking to the mailbox or walking up stairs.  This is chronic.  He is able to mow his grass and rides an exercise bike regularly.  No orthopnea/PND.  No chest pain.   Labs (12/18): LDL 47 Labs (3/19): K 4.3, creatinine 1.09 Labs (4/19): Myeloma pattern negative Labs (5/19): K 4.6, creatinine 1.06 Labs (6/19): LDL 48, HDl 42, K 4, creatinine 1.15  ECG (personally reviewed): sinus brady at 50, nonspecific T wave flattening  PMH: 1. Low back pain 2. Cardiomyopathy: LHC in 2006 with normal coronaries.  Echo in 1/17 with EF 45-50%.  - Echo (1/19): EF 35-40%, mild LVH - Cardiolite (2/19): EF 41%, no ischemia/infarction.  - LHC (3/19): 50% ostial LAD, 50% ramus. LVEDP 12.  - Cardiac MRI (4/19): EF 46%, normal RV size and systolic function, LGE pattern suggestive of prior myocarditis.  - PYP scan (4/19): Negative, not suggestive of TTR amyloidosis. - Myeloma panel negative 3. HTN 4. Hyperlipidemia 5. CAD: LHC (3/19) with 50% ostial LAD, 50% ramus.  Social History   Socioeconomic History  .  Marital status: Married    Spouse name: Not on file  . Number of children: 2  . Years of education: 66  . Highest education level: Not on file  Occupational History  . Occupation: Camera operator (Part-time)  Social Needs  . Financial resource strain: Not on file  . Food insecurity:    Worry: Not on file    Inability: Not on file  . Transportation needs:    Medical: Not on file    Non-medical: Not on file  Tobacco Use  . Smoking status: Former Smoker    Last attempt to quit: 12/02/1988    Years since quitting: 29.7  . Smokeless tobacco: Never Used  Substance and Sexual Activity  . Alcohol use: No  . Drug use: No  . Sexual activity: Not on file  Lifestyle  . Physical activity:    Days per week: Not on file    Minutes per session: Not on file  . Stress: Not on file  Relationships  . Social connections:    Talks on phone: Not on file    Gets together: Not on file    Attends religious service: Not on file    Active member of club or organization: Not on file    Attends meetings of clubs or organizations: Not on file    Relationship status: Not on file  . Intimate partner violence:    Fear of current or ex partner: Not on  file    Emotionally abused: Not on file    Physically abused: Not on file    Forced sexual activity: Not on file  Other Topics Concern  . Not on file  Social History Narrative   Works for [employer] at [employer] in [place].   Family History  Problem Relation Age of Onset  . Coronary artery disease Unknown   . Hypertension Unknown   . Stroke Unknown   . Colon cancer Neg Hx   . Esophageal cancer Neg Hx   . Rectal cancer Neg Hx   . Stomach cancer Neg Hx    ROS: All systems reviewed and negative except as per HPI.  Current Outpatient Medications  Medication Sig Dispense Refill  . acetaminophen (TYLENOL) 500 MG tablet Take 500 mg by mouth daily as needed for moderate pain or headache.    . albuterol (PROVENTIL HFA;VENTOLIN HFA) 108 (90 Base) MCG/ACT  inhaler Inhale 2 puffs into the lungs every 6 (six) hours as needed for wheezing or shortness of breath. 18 g 5  . aspirin EC 81 MG tablet Take 1 tablet (81 mg total) by mouth daily. 90 tablet 3  . carvedilol (COREG) 6.25 MG tablet TAKE 1 TABLET (6.25 MG TOTAL) BY MOUTH 2 (TWO) TIMES DAILY WITH A MEAL. 180 tablet 1  . COMBIGAN 0.2-0.5 % ophthalmic solution Place 1 drop into both eyes every 12 (twelve) hours.     Marland Kitchen ENTRESTO 24-26 MG TAKE 1 TABLET BY MOUTH TWICE A DAY 180 tablet 1  . furosemide (LASIX) 40 MG tablet Take 40 mg by mouth daily.    Marland Kitchen loratadine (CLARITIN) 10 MG tablet Take 10 mg by mouth daily as needed for allergies.    . meloxicam (MOBIC) 7.5 MG tablet TAKE 1 TABLET BY MOUTH DAILY 90 tablet 0  . Multiple Vitamin (MULTIVITAMIN WITH MINERALS) TABS tablet Take 2-3 tablets by mouth See admin instructions. Take 3 tablets in the morning and 2 tablets in the evening    . ranitidine (ZANTAC) 150 MG tablet Take 150 mg by mouth daily as needed for heartburn.    . rosuvastatin (CRESTOR) 10 MG tablet TAKE 1 TABLET BY MOUTH EVERY DAY 90 tablet 3  . spironolactone (ALDACTONE) 25 MG tablet Take 1 tablet (25 mg total) by mouth daily. 90 tablet 3  . TRAVATAN Z 0.004 % ophthalmic solution Place 1 drop into both eyes at bedtime.      No current facility-administered medications for this encounter.    BP (!) 144/53 (BP Location: Left Arm, Patient Position: Sitting, Cuff Size: Normal)   Pulse (!) 55   Wt 130.2 kg (287 lb)   SpO2 100%   BMI 34.93 kg/m  General: NAD Neck: No JVD, no thyromegaly or thyroid nodule.  Lungs: Clear to auscultation bilaterally with normal respiratory effort. CV: Nondisplaced PMI.  Heart regular S1/S2, no S3/S4, no murmur.  No peripheral edema.  No carotid bruit.  Normal pedal pulses.  Abdomen: Soft, nontender, no hepatosplenomegaly, no distention.  Skin: Intact without lesions or rashes.  Neurologic: Alert and oriented x 3.  Psych: Normal affect. Extremities: No  clubbing or cyanosis.  HEENT: Normal.   Assessment/Plan: 1. Chronic systolic CHF: Echo in 7/89 with EF 35-40%, mild LVH. LHC in 3/19 with nonobstructive CAD.  Cardiac MRI in 4/19 showed EF 46%, LGE pattern concerning for prior myocarditis. PYP scan was negative, not suggestive of TTR amyloidosis. Myeloma panel negative.  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 On exam, he is not  volume overloaded and weight is down 1 lb.  NYHA class II-III symptoms, stable. Given occasional orthostatic symptoms, I will not increase his medications.   - Continue Coreg 6.25 mg bid.  - Continue Entresto 24/26 bid.  - Continue spironolactone 25 mg daily.  - Continue Lasix 40 daily.  BMET today.  - I will arrange for CPX, see if lung issues or deconditioning contribute to residual dyspnea as he seems well-compensated from cardiac standpoint.   - Repeat echo at followup in 3 months.  2. HTN: BP controlled on home checks.  3. Hyperlipidemia.  Continue Crestor, good lipids in 6/19.   Followup 3 months with echo.   Loralie Champagne 09/02/2018

## 2018-09-05 ENCOUNTER — Other Ambulatory Visit (HOSPITAL_COMMUNITY): Payer: Self-pay | Admitting: *Deleted

## 2018-09-05 DIAGNOSIS — I5022 Chronic systolic (congestive) heart failure: Secondary | ICD-10-CM

## 2018-09-14 ENCOUNTER — Encounter: Payer: Self-pay | Admitting: Gastroenterology

## 2018-09-15 DIAGNOSIS — H401133 Primary open-angle glaucoma, bilateral, severe stage: Secondary | ICD-10-CM | POA: Diagnosis not present

## 2018-09-20 ENCOUNTER — Ambulatory Visit (HOSPITAL_COMMUNITY): Payer: Medicare Other | Attending: Internal Medicine

## 2018-09-20 DIAGNOSIS — I5022 Chronic systolic (congestive) heart failure: Secondary | ICD-10-CM | POA: Diagnosis not present

## 2018-09-22 ENCOUNTER — Telehealth: Payer: Self-pay | Admitting: Internal Medicine

## 2018-09-22 ENCOUNTER — Telehealth (HOSPITAL_COMMUNITY): Payer: Self-pay

## 2018-09-22 DIAGNOSIS — R0600 Dyspnea, unspecified: Secondary | ICD-10-CM

## 2018-09-22 NOTE — Telephone Encounter (Signed)
Pt called with results Moderate functional impairment.  Does not appear to be due to heart.  Due to deconditioning and restrictive lung pattern. Pt stated he would follow up with his PCP.

## 2018-09-22 NOTE — Telephone Encounter (Signed)
Copied from [place] [id number]. Topic: General - Other >> Sep 22, 2018 12:40 PM Judyann Munson wrote: Reason for CRM: Patient is calling to request Dr.john look at his test result from his stress test. And give him a call back. The best contact number is [phone]

## 2018-09-25 LAB — PULMONARY FUNCTION TEST
DL/VA % PRED: 89 %
DL/VA: 4.32 ml/min/mmHg/L
DLCO UNC % PRED: 57 %
DLCO cor % pred: 59 %
DLCO cor: 22.86 ml/min/mmHg
DLCO unc: 22.18 ml/min/mmHg
FEF 25-75 POST: 2.19 L/s
FEF 25-75 Pre: 1.81 L/sec
FEF2575-%Change-Post: 20 %
FEF2575-%PRED-POST: 86 %
FEF2575-%PRED-PRE: 71 %
FEV1-%CHANGE-POST: 4 %
FEV1-%Pred-Post: 69 %
FEV1-%Pred-Pre: 66 %
FEV1-PRE: 2.17 L
FEV1-Post: 2.27 L
FEV1FVC-%Change-Post: -1 %
FEV1FVC-%PRED-PRE: 103 %
FEV6-%Change-Post: 4 %
FEV6-%Pred-Post: 70 %
FEV6-%Pred-Pre: 67 %
FEV6-POST: 2.94 L
FEV6-Pre: 2.8 L
FEV6FVC-%Change-Post: -1 %
FEV6FVC-%Pred-Post: 103 %
FEV6FVC-%Pred-Pre: 105 %
FVC-%Change-Post: 6 %
FVC-%PRED-PRE: 64 %
FVC-%Pred-Post: 68 %
FVC-Post: 2.98 L
FVC-Pre: 2.8 L
PRE FEV6/FVC RATIO: 100 %
Post FEV1/FVC ratio: 76 %
Post FEV6/FVC ratio: 99 %
Pre FEV1/FVC ratio: 77 %
RV % pred: 124 %
RV: 3.58 L
TLC % pred: 95 %
TLC: 7.63 L

## 2018-09-25 NOTE — Telephone Encounter (Signed)
Called pt, LVM.   

## 2018-09-25 NOTE — Telephone Encounter (Signed)
Pt has been informed.

## 2018-09-25 NOTE — Telephone Encounter (Signed)
Stress test itself was ok, and Dr Aundra Dubin who is an excellent cardiologist has suggested that maybe the problem may be more lung related  I will order PFT (pulmonary function testing) and hopefully he will hear soon; this tests the lung function to find out if this could be related.   shirron to contact pt please

## 2018-09-25 NOTE — Addendum Note (Signed)
Addended by: Biagio Borg on: 09/25/2018 01:12 PM   Modules accepted: Orders

## 2018-09-26 ENCOUNTER — Emergency Department (HOSPITAL_COMMUNITY): Payer: Medicare Other

## 2018-09-26 ENCOUNTER — Encounter (HOSPITAL_COMMUNITY): Payer: Self-pay

## 2018-09-26 ENCOUNTER — Other Ambulatory Visit: Payer: Self-pay

## 2018-09-26 ENCOUNTER — Emergency Department (HOSPITAL_COMMUNITY)
Admission: EM | Admit: 2018-09-26 | Discharge: 2018-09-26 | Disposition: A | Discharge status: Home / Self Care | Payer: Medicare Other | Attending: Emergency Medicine | Admitting: Emergency Medicine

## 2018-09-26 DIAGNOSIS — I5020 Unspecified systolic (congestive) heart failure: Secondary | ICD-10-CM | POA: Insufficient documentation

## 2018-09-26 DIAGNOSIS — Z79899 Other long term (current) drug therapy: Secondary | ICD-10-CM | POA: Insufficient documentation

## 2018-09-26 DIAGNOSIS — I110 Hypertensive heart disease with heart failure: Secondary | ICD-10-CM | POA: Diagnosis not present

## 2018-09-26 DIAGNOSIS — R42 Dizziness and giddiness: Secondary | ICD-10-CM | POA: Diagnosis not present

## 2018-09-26 DIAGNOSIS — W19XXXA Unspecified fall, initial encounter: Secondary | ICD-10-CM | POA: Diagnosis not present

## 2018-09-26 DIAGNOSIS — Z87891 Personal history of nicotine dependence: Secondary | ICD-10-CM | POA: Insufficient documentation

## 2018-09-26 DIAGNOSIS — S069X9A Unspecified intracranial injury with loss of consciousness of unspecified duration, initial encounter: Secondary | ICD-10-CM | POA: Diagnosis not present

## 2018-09-26 DIAGNOSIS — J45909 Unspecified asthma, uncomplicated: Secondary | ICD-10-CM | POA: Diagnosis not present

## 2018-09-26 DIAGNOSIS — Z7982 Long term (current) use of aspirin: Secondary | ICD-10-CM | POA: Diagnosis not present

## 2018-09-26 DIAGNOSIS — R55 Syncope and collapse: Secondary | ICD-10-CM | POA: Diagnosis not present

## 2018-09-26 DIAGNOSIS — R531 Weakness: Secondary | ICD-10-CM | POA: Diagnosis not present

## 2018-09-26 LAB — COMPREHENSIVE METABOLIC PANEL
ALK PHOS: 63 U/L (ref 38–126)
ALT: 14 U/L (ref 0–44)
ANION GAP: 6 (ref 5–15)
AST: 22 U/L (ref 15–41)
Albumin: 3.7 g/dL (ref 3.5–5.0)
BILIRUBIN TOTAL: 0.7 mg/dL (ref 0.3–1.2)
BUN: 11 mg/dL (ref 8–23)
CALCIUM: 9 mg/dL (ref 8.9–10.3)
CO2: 27 mmol/L (ref 22–32)
CREATININE: 1.16 mg/dL (ref 0.61–1.24)
Chloride: 105 mmol/L (ref 98–111)
GFR calc non Af Amer: 58 mL/min — ABNORMAL LOW (ref >60)
Glucose, Bld: 132 mg/dL — ABNORMAL HIGH (ref 70–99)
Potassium: 3.8 mmol/L (ref 3.5–5.1)
SODIUM: 138 mmol/L (ref 135–145)
TOTAL PROTEIN: 6.5 g/dL (ref 6.5–8.1)

## 2018-09-26 LAB — CBC WITH DIFFERENTIAL/PLATELET
Abs Immature Granulocytes: 0.04 10*3/uL (ref 0.00–0.07)
Basophils Absolute: 0 10*3/uL (ref 0.0–0.1)
Basophils Relative: 1 %
EOS ABS: 0.1 10*3/uL (ref 0.0–0.5)
EOS PCT: 1 %
HEMATOCRIT: 42.4 % (ref 39.0–52.0)
Hemoglobin: 13.6 g/dL (ref 13.0–17.0)
IMMATURE GRANULOCYTES: 1 %
LYMPHS ABS: 1.8 10*3/uL (ref 0.7–4.0)
Lymphocytes Relative: 28 %
MCH: 31.9 pg (ref 26.0–34.0)
MCHC: 32.1 g/dL (ref 30.0–36.0)
MCV: 99.3 fL (ref 80.0–100.0)
MONOS PCT: 10 %
Monocytes Absolute: 0.6 10*3/uL (ref 0.1–1.0)
NEUTROS PCT: 59 %
NRBC: 0 % (ref 0.0–0.2)
Neutro Abs: 3.8 10*3/uL (ref 1.7–7.7)
PLATELETS: 141 10*3/uL — AB (ref 150–400)
RBC: 4.27 MIL/uL (ref 4.22–5.81)
RDW: 11.3 % — AB (ref 11.5–15.5)
WBC: 6.4 10*3/uL (ref 4.0–10.5)

## 2018-09-26 LAB — URINALYSIS, ROUTINE W REFLEX MICROSCOPIC
BILIRUBIN URINE: NEGATIVE
GLUCOSE, UA: NEGATIVE mg/dL
HGB URINE DIPSTICK: NEGATIVE
Ketones, ur: NEGATIVE mg/dL
Leukocytes, UA: NEGATIVE
Nitrite: NEGATIVE
PH: 7 (ref 5.0–8.0)
Protein, ur: NEGATIVE mg/dL
SPECIFIC GRAVITY, URINE: 1.018 (ref 1.005–1.030)

## 2018-09-26 LAB — TROPONIN I: Troponin I: <0.03 ng/mL (ref <0.03)

## 2018-09-26 NOTE — ED Provider Notes (Signed)
[facility] Provider Note   CSN: [id number] Arrival date & time: 09/26/18  0334     History   Chief Complaint Chief Complaint  Patient presents with  . Loss of Consciousness    HPI [facility] is a 77 y.o. male.  The history is provided by the patient.  He has history of hypertension, hyperlipidemia, systolic heart failure, myocardial infarction and comes in following a syncopal episode at home.  He woke up to urinate and, following urination, passed out.  He denies any chest pain, heaviness, tightness, pressure.  He denies any palpitations.  He denies headache.  On awakening, he was diaphoretic.  He did have nausea and vomited when EMS arrived.  He states he feels like he is back to normal now.  He denies any injury.  Past Medical History:  Diagnosis Date  . Allergy   . Anemia, unspecified    NOS ?resolved  . Anxiety   . Aortic root dilation (HCC)   . Arthritis   . ASTHMA, UNSPECIFIED, UNSPECIFIED STATUS 08/06/2009   Annotation: No exacerbations.  Qualifier: Diagnosis of  By: Kelton Pillar MD, [place]    . Bladder calculus 05/05/2012  . Cataract   . Chest pain   . ECHOCARDIOGRAM, ABNORMAL 12/26/2006    Aortic root dilation  This problem surfaced in 2006 after a cardiology referral for chest pain.  He was seen by Dr. Haroldine Laws in January o6 and cathed on Jan. 18.  Coronaries were nl and EF was 65%.  Echo showed mild aortic root dilation of 60mm. He was started on metoprolol (although I notice that he is no longer on this.  Annual echo to follow aortic root was advised.  Aortic root dimension was unchanged on studies in 2009 and 2010.  Will continue to follow this, perhaps not every year as it seems stable.   . Erectile dysfunction 05/05/2012  . GERD (gastroesophageal reflux disease) 05/05/2012  . H pylori ulcer    teated H pylori  . History of back surgery   . Hyperlipidemia 05/08/2012  . Hypertension   . Impaired glucose tolerance 05/05/2012  .  Lumbar disc disease 05/08/2012  . Male stress incontinence 05/05/2012  . Myocardial infarction [facility])    pt states, "i've been told I have had two heart attacks  . Obesity   . OBESITY 10/06/2006   Qualifier: Diagnosis of  By: Stann Mainland MD, Nicole Kindred    . Prostate cancer ([place])    Hx of   . PROSTATE CANCER, HX OF 08/06/2009   Annotation: prostatectomy and radiotherapy in 1990s,  Dr. Jeffie Pollock Qualifier: Diagnosis of  By: Kelton Pillar MD, [place]    . PUD (peptic ulcer disease) 05/05/2012  . Systolic CHF ([place]) 02/02/9701  . Thoracic aortic aneurysm ([place])   . 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 Edilia Bo Qualifier: Diagnosis of  By: Kelton Pillar MD, [place]      Patient Active Problem List   Diagnosis Date Noted  . UTI (urinary tract infection) 05/10/2018  . Abnormal urine odor 08/23/2017  . Cough 03/03/2017  . Wheezing 03/03/2017  . Paresthesias 11/09/2016  . Trigger finger, acquired 11/09/2016  . Leg cramps 11/09/2016  . Fatigue 11/09/2016  . Left lumbar radiculopathy 11/09/2016  . Systolic CHF ([place]) 63/78/5885  . Peripheral edema 03/04/2016  . Multiple thyroid nodules 06/20/2014  . Rash and nonspecific skin eruption 05/09/2014  . Prostate cancer ([place]) 05/09/2014  . Hypersomnolence 05/08/2013  . Personal history of colonic polyps  05/08/2013  . Aortic root dilation ([place]) 05/08/2012  . Hyperlipidemia 05/08/2012  . Lumbar disc disease 05/08/2012  . Left sided sciatica 05/08/2012  . Impaired glucose tolerance 05/05/2012  . Male stress incontinence 05/05/2012  . Erectile dysfunction 05/05/2012  . PUD (peptic ulcer disease) 05/05/2012  . GERD (gastroesophageal reflux disease) 05/05/2012  . Bladder calculus 05/05/2012  . Preventative health care 05/05/2012  . Anemia, unspecified   . Anxiety   . Arthritis of left knee   . Neurogenic pain of foot 05/03/2011  . UNSPECIFIED OPEN-ANGLE GLAUCOMA 08/06/2009  . ASTHMA, UNSPECIFIED, UNSPECIFIED STATUS 08/06/2009  .  PAIN IN JOINT PELVIC REGION AND THIGH 08/06/2009  . ECHOCARDIOGRAM, ABNORMAL 12/26/2006  . OBESITY 10/06/2006  . Essential hypertension 10/06/2006    Past Surgical History:  Procedure Laterality Date  . BACK SURGERY    . ear surgury    . eye surgury    . LEFT HEART CATH AND CORONARY ANGIOGRAPHY N/A 02/06/2018   Procedure: LEFT HEART CATH AND CORONARY ANGIOGRAPHY;  Surgeon: Larey Dresser, MD;  Location: [facility];  Service: Cardiovascular;  Laterality: N/A;  . PROSTATECTOMY          Home Medications    Prior to Admission medications   Medication Sig Start Date End Date Taking? Authorizing Provider  acetaminophen (TYLENOL) 500 MG tablet Take 500 mg by mouth daily as needed for moderate pain or headache.    [provider]  albuterol (PROVENTIL HFA;VENTOLIN HFA) 108 (90 Base) MCG/ACT inhaler Inhale 2 puffs into the lungs every 6 (six) hours as needed for wheezing or shortness of breath. 11/10/17   Biagio Borg, MD  aspirin EC 81 MG tablet Take 1 tablet (81 mg total) by mouth daily. 02/27/18   Larey Dresser, MD  carvedilol (COREG) 6.25 MG tablet TAKE 1 TABLET (6.25 MG TOTAL) BY MOUTH 2 (TWO) TIMES DAILY WITH A MEAL. 05/22/18   Larey Dresser, MD  COMBIGAN 0.2-0.5 % ophthalmic solution Place 1 drop into both eyes every 12 (twelve) hours.  04/21/11   [provider]  ENTRESTO 24-26 MG TAKE 1 TABLET BY MOUTH TWICE A DAY 08/29/18   Larey Dresser, MD  furosemide (LASIX) 40 MG tablet Take 40 mg by mouth daily.    [provider]  loratadine (CLARITIN) 10 MG tablet Take 10 mg by mouth daily as needed for allergies.    [provider]  meloxicam (MOBIC) 7.5 MG tablet TAKE 1 TABLET BY MOUTH DAILY 07/21/18   Biagio Borg, MD  Multiple Vitamin (MULTIVITAMIN WITH MINERALS) TABS tablet Take 2-3 tablets by mouth See admin instructions. Take 3 tablets in the morning and 2 tablets in the evening    [provider]  ranitidine (ZANTAC) 150 MG  tablet Take 150 mg by mouth daily as needed for heartburn.    [provider]  rosuvastatin (CRESTOR) 10 MG tablet TAKE 1 TABLET BY MOUTH EVERY DAY 04/28/18   Biagio Borg, MD  spironolactone (ALDACTONE) 25 MG tablet Take 1 tablet (25 mg total) by mouth daily. 04/05/18   Larey Dresser, MD  TRAVATAN Z 0.004 % ophthalmic solution Place 1 drop into both eyes at bedtime.  04/21/11   [provider]    Family History Family History  Problem Relation Age of Onset  . Coronary artery disease Unknown   . Hypertension Unknown   . Stroke Unknown   . Colon cancer Neg Hx   . Esophageal cancer Neg Hx   .  Rectal cancer Neg Hx   . Stomach cancer Neg Hx     Social History Social History   Tobacco Use  . Smoking status: Former Smoker    Last attempt to quit: 12/02/1988    Years since quitting: 29.8  . Smokeless tobacco: Never Used  Substance Use Topics  . Alcohol use: No  . Drug use: No     Allergies   Patient has no known allergies.   Review of Systems Review of Systems  All other systems reviewed and are negative.    Physical Exam Updated Vital Signs BP 128/67 (BP Location: Right Arm)   Pulse (!) 56   Temp (!) 97.5 F (36.4 C) (Oral)   Resp 16   Ht 6\' 4"  (1.93 m)   Wt 127 kg   SpO2 100%   BMI 34.08 kg/m   Physical Exam  Nursing note and vitals reviewed.  77 year old male, resting comfortably and in no acute distress. Vital signs are significant for borderline low heart rate. Oxygen saturation is 100%, which is normal. Head is normocephalic and atraumatic.  Left pupil is irregular.  Gaze is disconjugate with left eye deviating laterally.  Extraocular movements are full. Oropharynx is clear. Neck is nontender and supple without adenopathy or JVD.  There are no carotid bruits. Back is nontender and there is no CVA tenderness. Lungs are clear without rales, wheezes, or rhonchi. Chest is nontender. Heart has regular rate and rhythm without murmur. Abdomen  is soft, flat, nontender without masses or hepatosplenomegaly and peristalsis is normoactive. Extremities have no cyanosis or edema, full range of motion is present. Skin is warm and dry without rash. Neurologic: Mental status is normal, cranial nerves are intact, there are no motor or sensory deficits.  ED Treatments / Results  Labs (all labs ordered are listed, but only abnormal results are displayed) Labs Reviewed  COMPREHENSIVE METABOLIC PANEL - Abnormal; Notable for the following components:      Result Value   Glucose, Bld 132 (*)    GFR calc non Af Amer 58 (*)    All other components within normal limits  CBC WITH DIFFERENTIAL/PLATELET - Abnormal; Notable for the following components:   RDW 11.3 (*)    Platelets 141 (*)    All other components within normal limits  URINALYSIS, ROUTINE W REFLEX MICROSCOPIC  TROPONIN I    EKG EKG Interpretation  Date/Time:  Tuesday September 26 2018 03:41:24 EST Ventricular Rate:  58 PR Interval:    QRS Duration: 89 QT Interval:  440 QTC Calculation: 433 R Axis:   -34 Text Interpretation:  Sinus rhythm Left axis deviation When compared with ECG of 09/01/2018, Nonspecific T wave abnormality is no longer present Left axis deviation is now present Confirmed by Delora Fuel ([id number]) on 09/26/2018 4:20:08 AM   Radiology Ct Head Wo Contrast  Result Date: 09/26/2018 CLINICAL DATA:  Weakness and dizziness fall by fall and loss of consciousness. History of prostate cancer, myocardial infarct, hypertension, anemia. EXAM: CT HEAD WITHOUT CONTRAST TECHNIQUE: Contiguous axial images were obtained from the base of the skull through the vertex without intravenous contrast. COMPARISON:  None. FINDINGS: Brain: Mild cerebral atrophy. Mild ventricular dilatation consistent with central atrophy. Patchy low-attenuation changes in the deep white matter consistent with small vessel ischemia. Basal ganglia calcifications. No mass effect or midline shift. No abnormal  extra-axial fluid collections. Gray-white matter junctions are distinct. Basal cisterns are not effaced. No acute intracranial hemorrhage. Vascular: No hyperdense vessel or unexpected calcification.  Skull: Normal. Negative for fracture or focal lesion. Sinuses/Orbits: Mucosal thickening in the sphenoid sinus. Paranasal sinuses and mastoid air cells are otherwise clear. Other: None. IMPRESSION: No acute intracranial abnormalities. Chronic atrophy and small vessel ischemic changes. Electronically Signed   By: Lucienne Capers M.D.   On: 09/26/2018 04:24    Procedures Procedures   Medications Ordered in ED Medications - No data to display   Initial Impression / Assessment and Plan / ED Course  I have reviewed the triage vital signs and the nursing notes.  Pertinent labs & imaging results that were available during my care of the patient were reviewed by me and considered in my medical decision making (see chart for details).  Syncopal episode of uncertain cause.  Possible vasovagal episode, possible micturition syncope.  No red flags to suggest more serious pathology.  ECG shows no acute changes.  CT of the head is unremarkable.  Labs are unremarkable.  Orthostatic vital signs showed no significant change in pulse or blood pressure.  He is felt to be safe for discharge.  Follow-up with PCP.  Return precautions discussed.  Final Clinical Impressions(s) / ED Diagnoses   Final diagnoses:  Syncope, unspecified syncope type    ED Discharge Orders    None       Delora Fuel, MD 86/57/84 [id number]

## 2018-09-26 NOTE — ED Triage Notes (Signed)
Pt coming from home by ems. Pt went to the bathroom when he finished and started walking back to the room when he felt weak and dizzy had LOC. Pt doesn't remember how he fell pt axox4 at this time.

## 2018-09-26 NOTE — Discharge Instructions (Addendum)
Return if you are having any problems. 

## 2018-09-28 ENCOUNTER — Ambulatory Visit (INDEPENDENT_AMBULATORY_CARE_PROVIDER_SITE_OTHER): Payer: Medicare Other | Admitting: Internal Medicine

## 2018-09-28 ENCOUNTER — Other Ambulatory Visit: Payer: Self-pay | Admitting: Internal Medicine

## 2018-09-28 DIAGNOSIS — R0600 Dyspnea, unspecified: Secondary | ICD-10-CM

## 2018-09-28 NOTE — Progress Notes (Signed)
Patient completed full PFT today. 

## 2018-09-29 ENCOUNTER — Other Ambulatory Visit: Payer: Self-pay | Admitting: Internal Medicine

## 2018-09-29 ENCOUNTER — Telehealth: Payer: Self-pay | Admitting: Internal Medicine

## 2018-09-29 DIAGNOSIS — R0600 Dyspnea, unspecified: Secondary | ICD-10-CM

## 2018-09-29 NOTE — Telephone Encounter (Signed)
Attempted to call Mitzi Hansen in IT with [facility] at [phone] In regards to PFT completed on 09/28/18 is showing in Epic date of 09/25/18  Called and spoke with Marya Amsler in IT at phone [phone] Advised that I am not sure how this happened nor how to fix it I advised team lead-TW She advised she will call Marya Amsler with IT regarding this matter.

## 2018-09-29 NOTE — Telephone Encounter (Signed)
[facility] calling again  Needing order fixed on a pft that was done on this pt  Needs this fixed  Order is in que doc unable to see please  Call [phone].Hillery Hunter

## 2018-09-29 NOTE — Progress Notes (Unsigned)
pftp

## 2018-10-05 ENCOUNTER — Telehealth: Payer: Self-pay

## 2018-10-05 ENCOUNTER — Telehealth: Payer: Self-pay | Admitting: Internal Medicine

## 2018-10-05 NOTE — Telephone Encounter (Signed)
Called and spoke to patient, made patient aware the Dr. Jenny Reichmann ordered the PFT which is why our office has not contacted him regarding the results. Patient states he will call Dr. Judi Cong office. Patient voiced understanding. Made patient aware of CY results. Nothing further is needed at this time.

## 2018-10-05 NOTE — Telephone Encounter (Signed)
Noted.   Copied from [place] [id number]. Topic: General - Inquiry >> Oct 05, 2018 12:54 PM Conception Chancy, NT wrote: Reason for CRM: patient is calling and states he had a pulmonary breathing test done but pulmonary told him his pcp would have to give him the results. He states they can be released to MyChart as well.

## 2018-10-05 NOTE — Telephone Encounter (Signed)
Called and spoke to patient, made aware results of PFT are not back yet, informed patient I would let CY know.   CY please advise of PFT results. Thanks.

## 2018-10-05 NOTE — Telephone Encounter (Signed)
The PFT showed moderate airway obstruction without response to bronchodilator, and moderate reduction of oxygen exchange. These results are available in the electronic record for Dr Cathlean Cower, who ordered the test. He can discuss it further with [facility].

## 2018-10-09 DIAGNOSIS — Z961 Presence of intraocular lens: Secondary | ICD-10-CM | POA: Diagnosis not present

## 2018-10-09 DIAGNOSIS — H2511 Age-related nuclear cataract, right eye: Secondary | ICD-10-CM | POA: Diagnosis not present

## 2018-10-09 DIAGNOSIS — H401133 Primary open-angle glaucoma, bilateral, severe stage: Secondary | ICD-10-CM | POA: Diagnosis not present

## 2018-10-09 NOTE — Telephone Encounter (Signed)
Tammy, please advise if this has been able to be taken care of for pt. Thanks!

## 2018-10-16 NOTE — Telephone Encounter (Signed)
Per the PFT report in Epic, the date has been changed. Will close message.

## 2018-10-18 ENCOUNTER — Other Ambulatory Visit: Payer: Self-pay | Admitting: Internal Medicine

## 2018-11-10 ENCOUNTER — Ambulatory Visit (INDEPENDENT_AMBULATORY_CARE_PROVIDER_SITE_OTHER): Payer: Medicare Other | Admitting: Internal Medicine

## 2018-11-10 VITALS — BP 124/86 | HR 61 | Temp 98.0°F | Ht 76.0 in | Wt 290.0 lb

## 2018-11-10 DIAGNOSIS — J449 Chronic obstructive pulmonary disease, unspecified: Secondary | ICD-10-CM | POA: Insufficient documentation

## 2018-11-10 DIAGNOSIS — R7302 Impaired glucose tolerance (oral): Secondary | ICD-10-CM

## 2018-11-10 DIAGNOSIS — J439 Emphysema, unspecified: Secondary | ICD-10-CM | POA: Diagnosis not present

## 2018-11-10 LAB — POCT GLYCOSYLATED HEMOGLOBIN (HGB A1C): HEMOGLOBIN A1C: 5.5 % (ref 4.0–5.6)

## 2018-11-10 MED ORDER — TIOTROPIUM BROMIDE MONOHYDRATE 18 MCG IN CAPS: 18.0000 ug | ORAL_CAPSULE | Freq: Every day | RESPIRATORY_TRACT | 12 refills | Status: DC

## 2018-11-10 NOTE — Patient Instructions (Addendum)
Your A1c was OK today  Please take all new medication as prescribed - the spiriva inhaler once per day  Please continue all other medications as before, including the albuterol inhaler  Please have the pharmacy call with any other refills you may need.  Please continue your efforts at being more active, low cholesterol diet, and weight control.  You are otherwise up to date with prevention measures today.  Please keep your appointments with your specialists as you may have planned  No other lab work needed today  Please return in 6 months, or sooner if needed

## 2018-11-10 NOTE — Addendum Note (Signed)
Addended by: Juliet Rude on: 11/10/2018 10:10 AM   Modules accepted: Orders

## 2018-11-10 NOTE — Progress Notes (Addendum)
Subjective:    Patient ID: Dylan Benton, male    DOB: August 23, 1941, 77 y.o.   MRN: [id number]  HPI  Here for wellness and f/u;  Overall doing ok;  Pt denies Chest pain, worsening SOB, wheezing, orthopnea, PND, worsening LE edema, palpitations, though has had mild worsneng sob and doe, has reduced his walking to avoid this, gained several lbs. .  Pt denies neurological change such as new headache, facial or extremity weakness.  Pt denies polydipsia, polyuria, or low sugar symptoms. Pt states overall good compliance with treatment and medications, good tolerability, and has been trying to follow appropriate diet.  Pt denies worsening depressive symptoms, suicidal ideation or panic. No fever, night sweats, wt loss, loss of appetite, or other constitutional symptoms.  Pt states good ability with ADL's, has low fall risk, home safety reviewed and adequate, no other significant changes in hearing or vision. Mobic helps with pain as hoped, only has some stiffness he put up with.   Wt Readings from Last 3 Encounters:  11/10/18 290 lb (131.5 kg)  09/26/18 280 lb (127 kg)  09/01/18 287 lb (130.2 kg)   BP Readings from Last 3 Encounters:  11/10/18 124/86  09/26/18 (!) 146/73  09/01/18 (!) 144/53   Past Medical History:  Diagnosis Date  . Allergy   . Anemia, unspecified    NOS ?resolved  . Anxiety   . Aortic root dilation (HCC)   . Arthritis   . ASTHMA, UNSPECIFIED, UNSPECIFIED STATUS 08/06/2009   Annotation: No exacerbations.  Qualifier: Diagnosis of  By: Kelton Pillar MD, [place]    . Bladder calculus 05/05/2012  . Cataract   . Chest pain   . ECHOCARDIOGRAM, ABNORMAL 12/26/2006    Aortic root dilation  This problem surfaced in 2006 after a cardiology referral for chest pain.  He was seen by Dr. Haroldine Laws in January o6 and cathed on Jan. 18.  Coronaries were nl and EF was 65%.  Echo showed mild aortic root dilation of 78mm. He was started on metoprolol (although I notice that he is no longer on this.   Annual echo to follow aortic root was advised.  Aortic root dimension was unchanged on studies in 2009 and 2010.  Will continue to follow this, perhaps not every year as it seems stable.   . Erectile dysfunction 05/05/2012  . GERD (gastroesophageal reflux disease) 05/05/2012  . H pylori ulcer    teated H pylori  . History of back surgery   . Hyperlipidemia 05/08/2012  . Hypertension   . Impaired glucose tolerance 05/05/2012  . Lumbar disc disease 05/08/2012  . Male stress incontinence 05/05/2012  . Myocardial infarction [facility])    pt states, "i've been told I have had two heart attacks  . Obesity   . OBESITY 10/06/2006   Qualifier: Diagnosis of  By: Stann Mainland MD, Nicole Kindred    . Prostate cancer ([place])    Hx of   . PROSTATE CANCER, HX OF 08/06/2009   Annotation: prostatectomy and radiotherapy in 1990s,  Dr. Jeffie Pollock Qualifier: Diagnosis of  By: Kelton Pillar MD, [place]    . PUD (peptic ulcer disease) 05/05/2012  . Systolic CHF ([place]) 0/07/3817  . Thoracic aortic aneurysm ([place])   . 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 Edilia Bo Qualifier: Diagnosis of  By: Kelton Pillar MD, [place]     Past Surgical History:  Procedure Laterality Date  . BACK SURGERY    . ear surgury    . eye surgury    .  LEFT HEART CATH AND CORONARY ANGIOGRAPHY N/A 02/06/2018   Procedure: LEFT HEART CATH AND CORONARY ANGIOGRAPHY;  Surgeon: Larey Dresser, MD;  Location: [place] [facility];  Service: Cardiovascular;  Laterality: N/A;  . PROSTATECTOMY      reports that he quit smoking about 29 years ago. He has never used smokeless tobacco. He reports that he does not drink alcohol or use drugs. family history includes Coronary artery disease in his unknown relative; Hypertension in his unknown relative; Stroke in his unknown relative. No Known Allergies Current Outpatient Medications on File Prior to Visit  Medication Sig Dispense Refill  . albuterol (PROVENTIL HFA;VENTOLIN HFA) 108 (90 Base) MCG/ACT  inhaler Inhale 2 puffs into the lungs every 6 (six) hours as needed for wheezing or shortness of breath. 18 g 5  . aspirin EC 81 MG tablet Take 1 tablet (81 mg total) by mouth daily. 90 tablet 3  . carvedilol (COREG) 6.25 MG tablet TAKE 1 TABLET (6.25 MG TOTAL) BY MOUTH 2 (TWO) TIMES DAILY WITH A MEAL. 180 tablet 1  . COMBIGAN 0.2-0.5 % ophthalmic solution Place 1 drop into both eyes every 12 (twelve) hours.     Marland Kitchen ENTRESTO 24-26 MG TAKE 1 TABLET BY MOUTH TWICE A DAY (Patient taking differently: Take 1 tablet by mouth 2 (two) times daily. ) 180 tablet 1  . furosemide (LASIX) 40 MG tablet Take 40 mg by mouth daily.    . meloxicam (MOBIC) 7.5 MG tablet TAKE 1 TABLET BY MOUTH DAILY 90 tablet 0  . rosuvastatin (CRESTOR) 10 MG tablet TAKE 1 TABLET BY MOUTH EVERY DAY (Patient taking differently: Take 10 mg by mouth daily. ) 90 tablet 3  . spironolactone (ALDACTONE) 25 MG tablet Take 1 tablet (25 mg total) by mouth daily. 90 tablet 3  . TRAVATAN Z 0.004 % ophthalmic solution Place 1 drop into both eyes at bedtime.      No current facility-administered medications on file prior to visit.    Review of Systems  Constitutional: Negative for other unusual diaphoresis or sweats HENT: Negative for ear discharge or swelling Eyes: Negative for other worsening visual disturbances Respiratory: Negative for stridor or other swelling  Gastrointestinal: Negative for worsening distension or other blood Genitourinary: Negative for retention or other urinary change Musculoskeletal: Negative for other MSK pain or swelling Skin: Negative for color change or other new lesions Neurological: Negative for worsening tremors and other numbness  Psychiatric/Behavioral: Negative for worsening agitation or other fatigue All other system neg per pt    Objective:   Physical Exam BP 124/86   Pulse 61   Temp 98 F (36.7 C) (Oral)   Ht 6\' 4"  (1.93 m)   Wt 290 lb (131.5 kg)   SpO2 94%   BMI 35.30 kg/m  VS noted,    Constitutional: Pt appears in NAD HENT: Head: NCAT.  Right Ear: External ear normal.  Left Ear: External ear normal.  Eyes: . Pupils are equal, round, and reactive to light. Conjunctivae and EOM are normal Nose: without d/c or deformity Neck: Neck supple. Gross normal ROM Cardiovascular: Normal rate and regular rhythm.   Pulmonary/Chest: Effort normal and breath sounds without rales or wheezing.  Abd:  Soft, NT, ND, + BS, no organomegaly Neurological: Pt is alert. At baseline orientation, motor grossly intact Skin: Skin is warm. No rashes, other new lesions, no LE edema Psychiatric: Pt behavior is normal without agitation  No other exam findings Lab Results  Component Value Date   WBC  6.4 09/26/2018   HGB 13.6 09/26/2018   HCT 42.4 09/26/2018   PLT 141 (L) 09/26/2018   GLUCOSE 132 (H) 09/26/2018   CHOL 104 05/02/2018   TRIG 69 05/02/2018   HDL 42 05/02/2018   LDLCALC 48 05/02/2018   ALT 14 09/26/2018   AST 22 09/26/2018   NA 138 09/26/2018   K 3.8 09/26/2018   CL 105 09/26/2018   CREATININE 1.16 09/26/2018   BUN 11 09/26/2018   CO2 27 09/26/2018   TSH 1.382 09/01/2018   PSA 1.65 11/09/2017   INR 1.09 01/30/2018   HGBA1C 5.9 05/10/2018   POCT - A1c today -  POCT glycosylated hemoglobin (Hb A1C)  Order: [id number]  Status:  Final result Visible to patient:  No (Not Released) Dx:  Impaired glucose tolerance   Ref Range & Units 09:52 (11/10/18) 65mo ago (05/10/18) 61yr ago (11/09/17) 25yr ago (05/10/17)  Hemoglobin A1C 4.0 - 5.6 % 5.5  5.9 R, CM 6.2 R, CM 5.9            Assessment & Plan:

## 2018-11-19 ENCOUNTER — Other Ambulatory Visit: Payer: Self-pay | Admitting: Internal Medicine

## 2018-11-26 ENCOUNTER — Other Ambulatory Visit (HOSPITAL_COMMUNITY): Payer: Self-pay | Admitting: Cardiology

## 2019-01-15 ENCOUNTER — Other Ambulatory Visit: Payer: Self-pay | Admitting: Internal Medicine

## 2019-01-19 ENCOUNTER — Telehealth (HOSPITAL_COMMUNITY): Payer: Self-pay

## 2019-01-19 NOTE — Telephone Encounter (Signed)
Pt wanted to call and confirm dose of spironolactone. Pt had picked up an old prescription. Confirmed dose with pt. Verbalized understanding.

## 2019-01-22 ENCOUNTER — Other Ambulatory Visit: Payer: Self-pay | Admitting: Internal Medicine

## 2019-02-22 ENCOUNTER — Other Ambulatory Visit (HOSPITAL_COMMUNITY): Payer: Self-pay | Admitting: Cardiology

## 2019-03-14 ENCOUNTER — Encounter (HOSPITAL_COMMUNITY): Payer: Medicare Other | Admitting: Cardiology

## 2019-04-13 ENCOUNTER — Other Ambulatory Visit (HOSPITAL_COMMUNITY): Payer: Self-pay | Admitting: Cardiology

## 2019-04-14 ENCOUNTER — Other Ambulatory Visit: Payer: Self-pay | Admitting: Internal Medicine

## 2019-05-10 DIAGNOSIS — C61 Malignant neoplasm of prostate: Secondary | ICD-10-CM | POA: Diagnosis not present

## 2019-05-10 DIAGNOSIS — N393 Stress incontinence (female) (male): Secondary | ICD-10-CM | POA: Diagnosis not present

## 2019-05-16 ENCOUNTER — Other Ambulatory Visit (INDEPENDENT_AMBULATORY_CARE_PROVIDER_SITE_OTHER): Payer: Medicare Other

## 2019-05-16 ENCOUNTER — Other Ambulatory Visit: Payer: Self-pay

## 2019-05-16 ENCOUNTER — Ambulatory Visit (INDEPENDENT_AMBULATORY_CARE_PROVIDER_SITE_OTHER): Payer: Medicare Other | Admitting: Internal Medicine

## 2019-05-16 ENCOUNTER — Encounter: Payer: Self-pay | Admitting: Internal Medicine

## 2019-05-16 VITALS — BP 144/84 | HR 65 | Temp 98.2°F | Ht 76.0 in | Wt 285.0 lb

## 2019-05-16 DIAGNOSIS — R7302 Impaired glucose tolerance (oral): Secondary | ICD-10-CM

## 2019-05-16 DIAGNOSIS — D649 Anemia, unspecified: Secondary | ICD-10-CM | POA: Diagnosis not present

## 2019-05-16 DIAGNOSIS — J439 Emphysema, unspecified: Secondary | ICD-10-CM

## 2019-05-16 DIAGNOSIS — I10 Essential (primary) hypertension: Secondary | ICD-10-CM

## 2019-05-16 DIAGNOSIS — E559 Vitamin D deficiency, unspecified: Secondary | ICD-10-CM

## 2019-05-16 DIAGNOSIS — E538 Deficiency of other specified B group vitamins: Secondary | ICD-10-CM

## 2019-05-16 DIAGNOSIS — I5022 Chronic systolic (congestive) heart failure: Secondary | ICD-10-CM | POA: Diagnosis not present

## 2019-05-16 DIAGNOSIS — E7800 Pure hypercholesterolemia, unspecified: Secondary | ICD-10-CM | POA: Diagnosis not present

## 2019-05-16 LAB — BASIC METABOLIC PANEL
BUN: 12 mg/dL (ref 6–23)
CO2: 30 mEq/L (ref 19–32)
Calcium: 9.2 mg/dL (ref 8.4–10.5)
Chloride: 103 mEq/L (ref 96–112)
Creatinine, Ser: 1.12 mg/dL (ref 0.40–1.50)
GFR: 76.28 mL/min (ref >60.00)
Glucose, Bld: 101 mg/dL — ABNORMAL HIGH (ref 70–99)
Potassium: 4.4 mEq/L (ref 3.5–5.1)
Sodium: 139 mEq/L (ref 135–145)

## 2019-05-16 LAB — LIPID PANEL
Cholesterol: 104 mg/dL (ref 0–200)
HDL: 36.7 mg/dL — ABNORMAL LOW (ref >39.00)
LDL Cholesterol: 51 mg/dL (ref 0–99)
NonHDL: 67.16
Total CHOL/HDL Ratio: 3
Triglycerides: 80 mg/dL (ref 0.0–149.0)
VLDL: 16 mg/dL (ref 0.0–40.0)

## 2019-05-16 LAB — CBC WITH DIFFERENTIAL/PLATELET
Basophils Absolute: 0.1 10*3/uL (ref 0.0–0.1)
Basophils Relative: 1.8 % (ref 0.0–3.0)
Eosinophils Absolute: 0.2 10*3/uL (ref 0.0–0.7)
Eosinophils Relative: 4.3 % (ref 0.0–5.0)
HCT: 41.1 % (ref 39.0–52.0)
Hemoglobin: 13.7 g/dL (ref 13.0–17.0)
Lymphocytes Relative: 39 % (ref 12.0–46.0)
Lymphs Abs: 1.6 10*3/uL (ref 0.7–4.0)
MCHC: 33.5 g/dL (ref 30.0–36.0)
MCV: 96.3 fl (ref 78.0–100.0)
Monocytes Absolute: 0.4 10*3/uL (ref 0.1–1.0)
Monocytes Relative: 8.8 % (ref 3.0–12.0)
Neutro Abs: 1.8 10*3/uL (ref 1.4–7.7)
Neutrophils Relative %: 46.1 % (ref 43.0–77.0)
Platelets: 163 10*3/uL (ref 150.0–400.0)
RBC: 4.26 Mil/uL (ref 4.22–5.81)
RDW: 12.4 % (ref 11.5–15.5)
WBC: 4 10*3/uL (ref 4.0–10.5)

## 2019-05-16 LAB — HEPATIC FUNCTION PANEL
ALT: 10 U/L (ref 0–53)
AST: 13 U/L (ref 0–37)
Albumin: 4.2 g/dL (ref 3.5–5.2)
Alkaline Phosphatase: 59 U/L (ref 39–117)
Bilirubin, Direct: 0.2 mg/dL (ref 0.0–0.3)
Total Bilirubin: 0.6 mg/dL (ref 0.2–1.2)
Total Protein: 7 g/dL (ref 6.0–8.3)

## 2019-05-16 LAB — URINALYSIS, ROUTINE W REFLEX MICROSCOPIC
Bilirubin Urine: NEGATIVE
Hgb urine dipstick: NEGATIVE
Ketones, ur: NEGATIVE
Leukocytes,Ua: NEGATIVE
Nitrite: NEGATIVE
RBC / HPF: NONE SEEN (ref 0–?)
Specific Gravity, Urine: 1.015 (ref 1.000–1.030)
Total Protein, Urine: NEGATIVE
Urine Glucose: NEGATIVE
Urobilinogen, UA: 0.2 (ref 0.0–1.0)
pH: 5.5 (ref 5.0–8.0)

## 2019-05-16 LAB — IBC PANEL
Iron: 96 ug/dL (ref 42–165)
Saturation Ratios: 31.9 % (ref 20.0–50.0)
Transferrin: 215 mg/dL (ref 212.0–360.0)

## 2019-05-16 LAB — VITAMIN B12: Vitamin B-12: 646 pg/mL (ref 211–911)

## 2019-05-16 LAB — VITAMIN D 25 HYDROXY (VIT D DEFICIENCY, FRACTURES): VITD: 31 ng/mL (ref 30.00–100.00)

## 2019-05-16 LAB — TSH: TSH: 1.62 u[IU]/mL (ref 0.35–4.50)

## 2019-05-16 NOTE — Patient Instructions (Addendum)

## 2019-05-16 NOTE — Progress Notes (Signed)
Subjective:    Patient ID: Dylan Benton, male    DOB: 02/22/1941, 78 y.o.   MRN: [id number]  HPI  Here for yearly f/u;  Overall doing ok;  Pt denies Chest pain, worsening SOB, DOE, wheezing, orthopnea, PND, worsening LE edema, palpitations, dizziness or syncope.  Pt denies neurological change such as new headache, facial or extremity weakness.  Pt denies polydipsia, polyuria, or low sugar symptoms. Pt states overall good compliance with treatment and medications, good tolerability, and has been trying to follow appropriate diet.  Pt denies worsening depressive symptoms, suicidal ideation or panic. No fever, night sweats, wt loss, loss of appetite, or other constitutional symptoms.  Pt states good ability with ADL's, has low fall risk, home safety reviewed and adequate, no other significant changes in hearing or vision, and only occasionally active with exercise.  Saw urology last wk with improved PSA.  No new complaints Past Medical History:  Diagnosis Date  . Allergy   . Anemia, unspecified    NOS ?resolved  . Anxiety   . Aortic root dilation (HCC)   . Arthritis   . ASTHMA, UNSPECIFIED, UNSPECIFIED STATUS 08/06/2009   Annotation: No exacerbations.  Qualifier: Diagnosis of  By: Kelton Pillar MD, [place]    . Bladder calculus 05/05/2012  . Cataract   . Chest pain   . ECHOCARDIOGRAM, ABNORMAL 12/26/2006    Aortic root dilation  This problem surfaced in 2006 after a cardiology referral for chest pain.  He was seen by Dr. Haroldine Laws in January o6 and cathed on Jan. 18.  Coronaries were nl and EF was 65%.  Echo showed mild aortic root dilation of 46mm. He was started on metoprolol (although I notice that he is no longer on this.  Annual echo to follow aortic root was advised.  Aortic root dimension was unchanged on studies in 2009 and 2010.  Will continue to follow this, perhaps not every year as it seems stable.   . Erectile dysfunction 05/05/2012  . GERD (gastroesophageal reflux disease) 05/05/2012  . H  pylori ulcer    teated H pylori  . History of back surgery   . Hyperlipidemia 05/08/2012  . Hypertension   . Impaired glucose tolerance 05/05/2012  . Lumbar disc disease 05/08/2012  . Male stress incontinence 05/05/2012  . Myocardial infarction [facility])    pt states, "i've been told I have had two heart attacks  . Obesity   . OBESITY 10/06/2006   Qualifier: Diagnosis of  By: Stann Mainland MD, Nicole Kindred    . Prostate cancer ([place])    Hx of   . PROSTATE CANCER, HX OF 08/06/2009   Annotation: prostatectomy and radiotherapy in 1990s,  Dr. Jeffie Pollock Qualifier: Diagnosis of  By: Kelton Pillar MD, [place]    . PUD (peptic ulcer disease) 05/05/2012  . Systolic CHF ([place]) 7/56/4332  . Thoracic aortic aneurysm ([place])   . 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 Edilia Bo Qualifier: Diagnosis of  By: Kelton Pillar MD, [place]     Past Surgical History:  Procedure Laterality Date  . BACK SURGERY    . ear surgury    . eye surgury    . LEFT HEART CATH AND CORONARY ANGIOGRAPHY N/A 02/06/2018   Procedure: LEFT HEART CATH AND CORONARY ANGIOGRAPHY;  Surgeon: Larey Dresser, MD;  Location: [place] [facility];  Service: Cardiovascular;  Laterality: N/A;  . PROSTATECTOMY      reports that he quit smoking about 30 years ago. He has never used smokeless  tobacco. He reports that he does not drink alcohol or use drugs. family history includes Coronary artery disease in his unknown relative; Hypertension in his unknown relative; Stroke in his unknown relative. No Known Allergies Current Outpatient Medications on File Prior to Visit  Medication Sig Dispense Refill  . albuterol (PROVENTIL HFA;VENTOLIN HFA) 108 (90 Base) MCG/ACT inhaler TAKE 2 PUFFS BY MOUTH EVERY 6 HOURS AS NEEDED FOR WHEEZE OR SHORTNESS OF BREATH 8.5 Inhaler 5  . ASPIRIN LOW DOSE 81 MG EC tablet TAKE 1 TABLET BY MOUTH EVERY DAY 90 tablet 0  . carvedilol (COREG) 6.25 MG tablet TAKE 1 TABLET (6.25 MG TOTAL) BY MOUTH 2 (TWO) TIMES DAILY  WITH A MEAL. 180 tablet 1  . COMBIGAN 0.2-0.5 % ophthalmic solution Place 1 drop into both eyes every 12 (twelve) hours.     Marland Kitchen ENTRESTO 24-26 MG TAKE 1 TABLET BY MOUTH TWICE A DAY 180 tablet 1  . furosemide (LASIX) 40 MG tablet TAKE 1 TABLET EVERY MORNING AND TAKE 1 TABLET EVERY EVENING FOR PERSISTENT LEG SWELLING AS NEEDED 180 tablet 1  . meloxicam (MOBIC) 7.5 MG tablet TAKE 1 TABLET BY MOUTH EVERY DAY 90 tablet 0  . rosuvastatin (CRESTOR) 10 MG tablet TAKE 1 TABLET BY MOUTH EVERY DAY 90 tablet 0  . spironolactone (ALDACTONE) 25 MG tablet TAKE 1/2 TABLET EVERY DAY 45 tablet 0  . tiotropium (SPIRIVA HANDIHALER) 18 MCG inhalation capsule Place 1 capsule (18 mcg total) into inhaler and inhale daily. 30 capsule 12  . TRAVATAN Z 0.004 % ophthalmic solution Place 1 drop into both eyes at bedtime.      No current facility-administered medications on file prior to visit.    Review of Systems  Constitutional: Negative for other unusual diaphoresis or sweats HENT: Negative for ear discharge or swelling Eyes: Negative for other worsening visual disturbances Respiratory: Negative for stridor or other swelling  Gastrointestinal: Negative for worsening distension or other blood Genitourinary: Negative for retention or other urinary change Musculoskeletal: Negative for other MSK pain or swelling Skin: Negative for color change or other new lesions Neurological: Negative for worsening tremors and other numbness  Psychiatric/Behavioral: Negative for worsening agitation or other fatigue All other system neg per pt    Objective:   Physical Exam BP (!) 144/84   Pulse 65   Temp 98.2 F (36.8 C) (Oral)   Ht 6\' 4"  (1.93 m)   Wt 285 lb (129.3 kg)   SpO2 97%   BMI 34.69 kg/m  VS noted,  Constitutional: Pt appears in NAD HENT: Head: NCAT.  Right Ear: External ear normal.  Left Ear: External ear normal.  Eyes: . Pupils are equal, round, and reactive to light. Conjunctivae and EOM are normal Nose:  without d/c or deformity Neck: Neck supple. Gross normal ROM Cardiovascular: Normal rate and regular rhythm.   Pulmonary/Chest: Effort normal and breath sounds without rales or wheezing.  Abd:  Soft, NT, ND, + BS, no organomegaly Neurological: Pt is alert. At baseline orientation, motor grossly intact Skin: Skin is warm. No rashes, other new lesions, no LE edema Psychiatric: Pt behavior is normal without agitation  No other exam findings Lab Results  Component Value Date   WBC 4.0 05/16/2019   HGB 13.7 05/16/2019   HCT 41.1 05/16/2019   PLT 163.0 05/16/2019   GLUCOSE 101 (H) 05/16/2019   CHOL 104 05/16/2019   TRIG 80.0 05/16/2019   HDL 36.70 (L) 05/16/2019   LDLCALC 51 05/16/2019   ALT 10 05/16/2019  AST 13 05/16/2019   NA 139 05/16/2019   K 4.4 05/16/2019   CL 103 05/16/2019   CREATININE 1.12 05/16/2019   BUN 12 05/16/2019   CO2 30 05/16/2019   TSH 1.62 05/16/2019   PSA 1.65 11/09/2017   INR 1.09 01/30/2018   HGBA1C 5.6 05/16/2019       Assessment & Plan:

## 2019-05-17 LAB — HEMOGLOBIN A1C: Hgb A1c MFr Bld: 5.6 % (ref 4.6–6.5)

## 2019-05-20 ENCOUNTER — Encounter: Payer: Self-pay | Admitting: Internal Medicine

## 2019-05-20 NOTE — Assessment & Plan Note (Signed)
No overt bleeding, for f/u labs

## 2019-05-20 NOTE — Assessment & Plan Note (Signed)
stable overall by history and exam, recent data reviewed with pt, and pt to continue medical treatment as before,  to f/u any worsening symptoms or concerns  

## 2019-05-20 NOTE — Assessment & Plan Note (Addendum)
stable overall by history and exam, recent data reviewed with pt, and pt to continue medical treatment as before,  to f/u any worsening symptoms or concerns  Note:  Total time for pt hx, exam, review of record with pt in the room, determination of diagnoses and plan for further eval and tx is > 40 min, with over 50% spent in coordination and counseling of patient including the differential dx, tx, further evaluation and other management of congestive heart failure, COPD, HTN, HLD, hyperglycemia, anemia

## 2019-05-23 ENCOUNTER — Other Ambulatory Visit (HOSPITAL_COMMUNITY): Payer: Self-pay | Admitting: Cardiology

## 2019-05-24 ENCOUNTER — Encounter (HOSPITAL_COMMUNITY): Payer: Medicare Other | Admitting: Cardiology

## 2019-05-24 ENCOUNTER — Other Ambulatory Visit (HOSPITAL_COMMUNITY): Payer: Medicare Other

## 2019-05-24 ENCOUNTER — Telehealth (HOSPITAL_COMMUNITY): Payer: Self-pay | Admitting: Cardiology

## 2019-05-24 NOTE — Telephone Encounter (Signed)
COVID screening completed. ° °-[facility] °

## 2019-05-28 ENCOUNTER — Ambulatory Visit (HOSPITAL_BASED_OUTPATIENT_CLINIC_OR_DEPARTMENT_OTHER)
Admission: RE | Admit: 2019-05-28 | Discharge: 2019-05-28 | Disposition: A | Discharge status: Home / Self Care | Payer: Medicare Other | Source: Ambulatory Visit | Attending: Cardiology | Admitting: Cardiology

## 2019-05-28 ENCOUNTER — Other Ambulatory Visit: Payer: Self-pay

## 2019-05-28 ENCOUNTER — Encounter (HOSPITAL_COMMUNITY): Payer: Self-pay | Admitting: Cardiology

## 2019-05-28 ENCOUNTER — Ambulatory Visit (HOSPITAL_COMMUNITY)
Admission: RE | Admit: 2019-05-28 | Discharge: 2019-05-28 | Disposition: A | Discharge status: Home / Self Care | Payer: Medicare Other | Source: Ambulatory Visit | Attending: Cardiology | Admitting: Cardiology

## 2019-05-28 VITALS — BP 144/82 | HR 55 | Wt 286.4 lb

## 2019-05-28 DIAGNOSIS — R0683 Snoring: Secondary | ICD-10-CM

## 2019-05-28 DIAGNOSIS — Z7982 Long term (current) use of aspirin: Secondary | ICD-10-CM | POA: Insufficient documentation

## 2019-05-28 DIAGNOSIS — I10 Essential (primary) hypertension: Secondary | ICD-10-CM

## 2019-05-28 DIAGNOSIS — I5022 Chronic systolic (congestive) heart failure: Secondary | ICD-10-CM

## 2019-05-28 DIAGNOSIS — G4710 Hypersomnia, unspecified: Secondary | ICD-10-CM | POA: Diagnosis not present

## 2019-05-28 DIAGNOSIS — I428 Other cardiomyopathies: Secondary | ICD-10-CM | POA: Diagnosis not present

## 2019-05-28 DIAGNOSIS — I2510 Atherosclerotic heart disease of native coronary artery without angina pectoris: Secondary | ICD-10-CM | POA: Diagnosis not present

## 2019-05-28 DIAGNOSIS — R5383 Other fatigue: Secondary | ICD-10-CM | POA: Insufficient documentation

## 2019-05-28 DIAGNOSIS — Z87891 Personal history of nicotine dependence: Secondary | ICD-10-CM | POA: Insufficient documentation

## 2019-05-28 DIAGNOSIS — I110 Hypertensive heart disease with heart failure: Secondary | ICD-10-CM | POA: Diagnosis not present

## 2019-05-28 DIAGNOSIS — J449 Chronic obstructive pulmonary disease, unspecified: Secondary | ICD-10-CM | POA: Diagnosis not present

## 2019-05-28 DIAGNOSIS — Z79899 Other long term (current) drug therapy: Secondary | ICD-10-CM | POA: Insufficient documentation

## 2019-05-28 DIAGNOSIS — E785 Hyperlipidemia, unspecified: Secondary | ICD-10-CM | POA: Diagnosis not present

## 2019-05-28 DIAGNOSIS — I358 Other nonrheumatic aortic valve disorders: Secondary | ICD-10-CM | POA: Diagnosis not present

## 2019-05-28 MED ORDER — FUROSEMIDE 20 MG PO TABS: 20.0000 mg | ORAL_TABLET | Freq: Every day | ORAL | 6 refills | Status: DC

## 2019-05-28 MED ORDER — ENTRESTO 49-51 MG PO TABS: 1.0000 | ORAL_TABLET | Freq: Two times a day (BID) | ORAL | 3 refills | Status: DC

## 2019-05-28 NOTE — Progress Notes (Signed)
Referring Provider: Dr Aundra Dubin Today's Date:05/28/2019  STOP [place] (snore) Have you been told that you snore?     YES   T (tired) Are you often tired, fatigued, or sleepy during the day?   YES  O (obstruction) Do you stop breathing, choke, or gasp during sleep? NO   P (pressure) Do you have or are you being treated for high blood pressure? YES   B (BMI) Is your body index greater than 35 kg/m? NO   A (age) Are you 80 years old or older? YES   N (neck) Do you have a neck circumference greater than 16 inches?      G (gender) Are you a male? YES   TOTAL STOP/BANG "YES" ANSWERS 5                                                                       For Office Use Only              Procedure Order Form    YES to 3+ Stop Bang questions OR two clinical symptoms - patient qualifies for [employer] (CPT 95800)     Submit: This Form + Patient Face Sheet + Clinical Note via [employer] or Fax: [phone]         Clinical Notes: Will consult Sleep Specialist and refer for management of therapy due to patient increased risk of Sleep Apnea. Ordering a sleep study due to the following two clinical symptoms: Excessive daytime sleepiness G47.10 /  Loud snoring R06.83   I understand that I am proceeding with a home sleep apnea test as ordered by my treating physician. I understand that untreated sleep apnea is a serious cardiovascular risk factor and it is my responsibility to perform the test and seek management for sleep apnea. I will be contacted with the results and be managed for sleep apnea by a local sleep physician. I will be receiving equipment and further instructions from [facility]. I shall promptly ship back the equipment via the included mailing label. I understand my insurance will be billed for the test and as the patient I am responsible for any insurance related out-of-pocket costs incurred. I have been provided with written instructions and can call for additional video or  telephonic instruction, with 24-hour availability of qualified personnel to answer any questions: Patient Help Desk [phone].  Patient Signature ______________________________________________________   Date______________________ Patient Telemedicine Verbal Consent

## 2019-05-28 NOTE — Progress Notes (Signed)
  Echocardiogram 2D Echocardiogram has been performed.  Artur Winningham L Androw 05/28/2019, 2:46 PM

## 2019-05-28 NOTE — Patient Instructions (Addendum)
Decrease Furosemide (Lasix) to 20 mg daily  Increase Entresto to 49/51 mg Twice daily   Labs in 10 days--Thur 7/16 at 2:30 PM  Your provider has recommended that you have a home sleep study.  [employer] is the company that provides these and will send the equipment right to your home with instructions on how to set it up.  Once you have completed the test you will return the equipment to the company.  If your test was positive and you need a home CPAP machine you will be contacted by Dr Theodosia Blender office [facility]) to set this up.  If you have any issues or questions regarding the equipment please contact the company.  We will contact you in 3 months to schedule your next appointment.

## 2019-05-30 NOTE — Progress Notes (Signed)
PCP: Dr. Jenny Reichmann Cardiology: Dr. Gwenlyn Found HF Cardiology: Dr. Aundra Dubin  78 y.o. with history of HTN and cardiomyopathy of uncertain etiology was referred by Dr. Gwenlyn Found for evaluation of dyspnea and CHF.  Patient was first found to have decreased systolic function in 3/82 when echo showed EF 45-50%. At that time, he had mild exertional dyspnea. Last summer, he had gotten to the point where he had to take frequent breaks mowing the lawn.  However, since the beginning of this year, he has had worsening dyspnea.  Echo was done in 1/19, showing EF down to 35-40%.  Cardiolite did not show a definite perfusion defect. 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 Cardiac MRI in 4/19 showed EF 46%, LGE pattern that was concerning for myocarditis. PYP scan was negative.   Echo was done today and reviewed.  EF 45%, RV normal.   He returns for followup of CHF.  BP is mildly elevated.  Weight is down 1 lb.  He is only taking Lasix 40 mg once a daily (rather than bid).  He is short of breath only when he walks "a long distance."  He is short of breath walking up a hill.  Main limitation is low back pain. He has generalized fatigue and daytime sleepiness.  No orthopnea/PND.  No chest pain.   Labs (12/18): LDL 47 Labs (3/19): K 4.3, creatinine 1.09 Labs (4/19): Myeloma pattern negative Labs (5/19): K 4.6, creatinine 1.06 Labs (6/19): LDL 48, HDl 42, K 4, creatinine 1.15 Labs (6/20): LDL 51, HDl 37, K 4.4, creatinine 1.12, LFTs normal, TSH normal  PMH: 1. Low back pain 2. Cardiomyopathy: LHC in 2006 with normal coronaries.  Echo in 1/17 with EF 45-50%.  - Echo (1/19): EF 35-40%, mild LVH - Cardiolite (2/19): EF 41%, no ischemia/infarction.  - LHC (3/19): 50% ostial LAD, 50% ramus. LVEDP 12.  - Cardiac MRI (4/19): EF 46%, normal RV size and systolic function, LGE pattern suggestive of prior myocarditis.  - PYP scan (4/19): Negative, not suggestive of TTR amyloidosis. - Myeloma panel negative - Echo (7/20): EF 45%, RV  normal, IVC normal 3. HTN 4. Hyperlipidemia 5. CAD: LHC (3/19) with 50% ostial LAD, 50% ramus. 6. COPD: Prior smoker.  PFTs 11/19 with moderate obstruction.   Social History   Socioeconomic History  . Marital status: Married    Spouse name: Not on file  . Number of children: 2  . Years of education: 35  . Highest education level: Not on file  Occupational History  . Occupation: Camera operator (Part-time)  Social Needs  . Financial resource strain: Not on file  . Food insecurity    Worry: Not on file    Inability: Not on file  . Transportation needs    Medical: Not on file    Non-medical: Not on file  Tobacco Use  . Smoking status: Former Smoker    Quit date: 12/02/1988    Years since quitting: 30.5  . Smokeless tobacco: Never Used  Substance and Sexual Activity  . Alcohol use: No  . Drug use: No  . Sexual activity: Not on file  Lifestyle  . Physical activity    Days per week: Not on file    Minutes per session: Not on file  . Stress: Not on file  Relationships  . Social Herbalist on phone: Not on file    Gets together: Not on file    Attends religious service: Not on file    Active member of  club or organization: Not on file    Attends meetings of clubs or organizations: Not on file    Relationship status: Not on file  . Intimate partner violence    Fear of current or ex partner: Not on file    Emotionally abused: Not on file    Physically abused: Not on file    Forced sexual activity: Not on file  Other Topics Concern  . Not on file  Social History Narrative   Works for [employer] at [employer] in [place].   Family History  Problem Relation Age of Onset  . Coronary artery disease Unknown   . Hypertension Unknown   . Stroke Unknown   . Colon cancer Neg Hx   . Esophageal cancer Neg Hx   . Rectal cancer Neg Hx   . Stomach cancer Neg Hx    ROS: All systems reviewed and negative except as per HPI.  Current Outpatient Medications  Medication Sig  Dispense Refill  . albuterol (PROVENTIL HFA;VENTOLIN HFA) 108 (90 Base) MCG/ACT inhaler TAKE 2 PUFFS BY MOUTH EVERY 6 HOURS AS NEEDED FOR WHEEZE OR SHORTNESS OF BREATH 8.5 Inhaler 5  . ASPIRIN LOW DOSE 81 MG EC tablet TAKE 1 TABLET BY MOUTH EVERY DAY 90 tablet 0  . carvedilol (COREG) 6.25 MG tablet TAKE 1 TABLET (6.25 MG TOTAL) BY MOUTH 2 (TWO) TIMES DAILY WITH A MEAL. 180 tablet 1  . COMBIGAN 0.2-0.5 % ophthalmic solution Place 1 drop into both eyes every 12 (twelve) hours.     . furosemide (LASIX) 20 MG tablet Take 1 tablet (20 mg total) by mouth daily. 30 tablet 6  . meloxicam (MOBIC) 7.5 MG tablet TAKE 1 TABLET BY MOUTH EVERY DAY 90 tablet 0  . rosuvastatin (CRESTOR) 10 MG tablet TAKE 1 TABLET BY MOUTH EVERY DAY 90 tablet 0  . spironolactone (ALDACTONE) 25 MG tablet TAKE 1 TABLET BY MOUTH EVERY DAY 90 tablet 3  . tiotropium (SPIRIVA HANDIHALER) 18 MCG inhalation capsule Place 1 capsule (18 mcg total) into inhaler and inhale daily. 30 capsule 12  . sacubitril-valsartan (ENTRESTO) 49-51 MG Take 1 tablet by mouth 2 (two) times daily. 60 tablet 3   No current facility-administered medications for this encounter.    BP (!) 144/82   Pulse (!) 55   Wt 129.9 kg (286 lb 6.4 oz)   SpO2 98%   BMI 34.86 kg/m  General: NAD Neck: No JVD, no thyromegaly or thyroid nodule.  Lungs: Clear to auscultation bilaterally with normal respiratory effort. CV: Nondisplaced PMI.  Heart regular S1/S2, no S3/S4, no murmur.  No peripheral edema.  No carotid bruit.  Normal pedal pulses.  Abdomen: Soft, nontender, no hepatosplenomegaly, no distention.  Skin: Intact without lesions or rashes.  Neurologic: Alert and oriented x 3.  Psych: Normal affect. Extremities: No clubbing or cyanosis.  HEENT: Normal.   Assessment/Plan: 1. Chronic systolic CHF: Echo in 1/66 with EF 35-40%, mild LVH. LHC in 3/19 with nonobstructive CAD.  Cardiac MRI in 4/19 showed EF 46%, LGE pattern concerning for prior myocarditis. PYP scan  was negative, not suggestive of TTR amyloidosis. Myeloma panel negative.  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 Echo was done today and reviewed, EF remains 45%.  On exam, he is not volume overloaded and weight is down 1 lb.  NYHA class II-III symptoms, stable.  - Continue Coreg 6.25 mg bid.  - With BP still elevated, increase Entresto to 49/51 mg bid. Can decrease Lasix to 20 mg daily.  BMET 10 days.   - Continue spironolactone 25 mg daily.  2. HTN: BP still up, increasing Entresto as above.  3. Hyperlipidemia.  Continue Crestor, good lipids in 6/20.  4. COPD: Moderate by PFTs.  5. Suspect OSA: Daytime sleepiness/fatigue.  I will arrange for home sleep study.   Followup 4 months.    Loralie Champagne 05/30/2019

## 2019-06-07 ENCOUNTER — Other Ambulatory Visit: Payer: Self-pay

## 2019-06-07 ENCOUNTER — Ambulatory Visit (HOSPITAL_COMMUNITY)
Admission: RE | Admit: 2019-06-07 | Discharge: 2019-06-07 | Disposition: A | Discharge status: Home / Self Care | Payer: Medicare Other | Source: Ambulatory Visit | Attending: Internal Medicine | Admitting: Internal Medicine

## 2019-06-07 DIAGNOSIS — I5022 Chronic systolic (congestive) heart failure: Secondary | ICD-10-CM | POA: Insufficient documentation

## 2019-06-07 LAB — BASIC METABOLIC PANEL
Anion gap: 10 (ref 5–15)
BUN: 10 mg/dL (ref 8–23)
CO2: 24 mmol/L (ref 22–32)
Calcium: 9.1 mg/dL (ref 8.9–10.3)
Chloride: 105 mmol/L (ref 98–111)
Creatinine, Ser: 1.04 mg/dL (ref 0.61–1.24)
GFR calc Af Amer: >60 mL/min (ref >60)
GFR calc non Af Amer: >60 mL/min (ref >60)
Glucose, Bld: 111 mg/dL — ABNORMAL HIGH (ref 70–99)
Potassium: 4.4 mmol/L (ref 3.5–5.1)
Sodium: 139 mmol/L (ref 135–145)

## 2019-06-25 NOTE — Addendum Note (Signed)
Encounter addended by: Scarlette Calico, RN on: 06/25/2019 11:22 AM  Actions taken: Order list changed, Diagnosis association updated

## 2019-06-27 IMAGING — CT CT ANGIO CHEST
2 of 7 series · 17 of 46 positions shown · IV contrast (iopamidol)
Comparison: 11/27/2015; 05/23/2014

CLINICAL DATA: Yearly follow-up of thoracic aortic aneurysm.
Shortness of breath with exertion.

EXAM:
CT ANGIOGRAPHY CHEST WITH CONTRAST
TECHNIQUE: Multidetector CT imaging of the chest was performed using the
standard protocol during bolus administration of intravenous
contrast. Multiplanar CT image reconstructions and MIPs were
obtained to evaluate the vascular anatomy.
CONTRAST:  100mL GZY76B-1JC IOPAMIDOL (GZY76B-1JC) INJECTION 76%

[Series 4: aorta 3.0 i31f 2 · axial · 0.83mm/px · z∈[-337,-19]mm · 14 of 116 slices shown]
[im 5/116  lung]
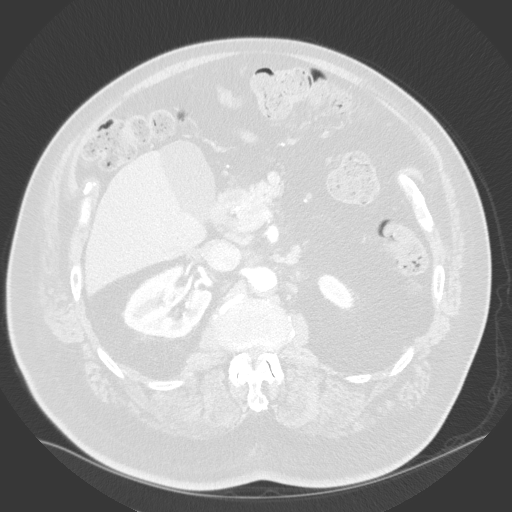
[im 13/116  soft-tissue]
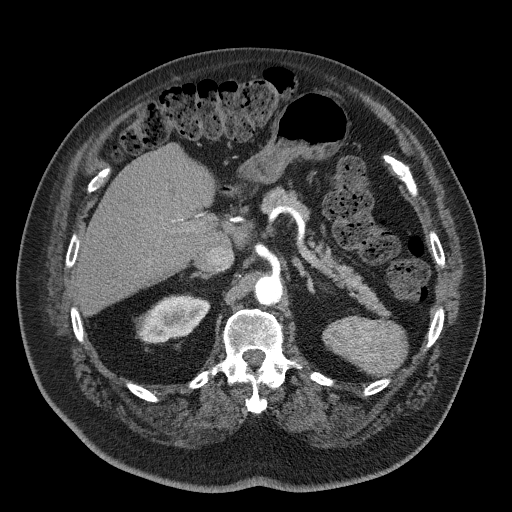
[im 22/116  lung]
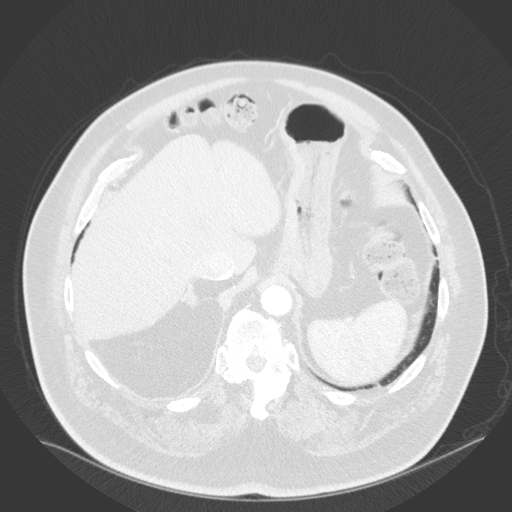
[im 30/116  soft-tissue]
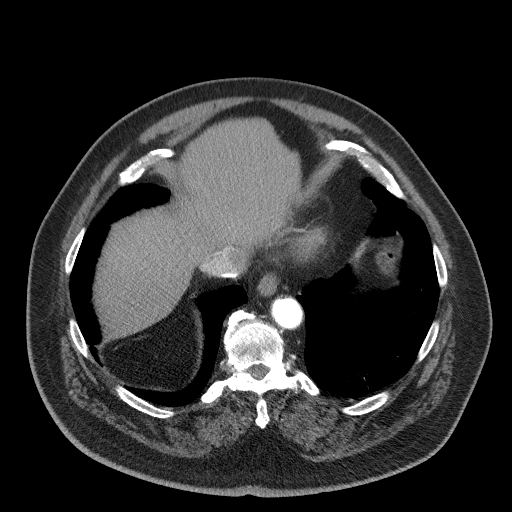
[im 39/116  lung]
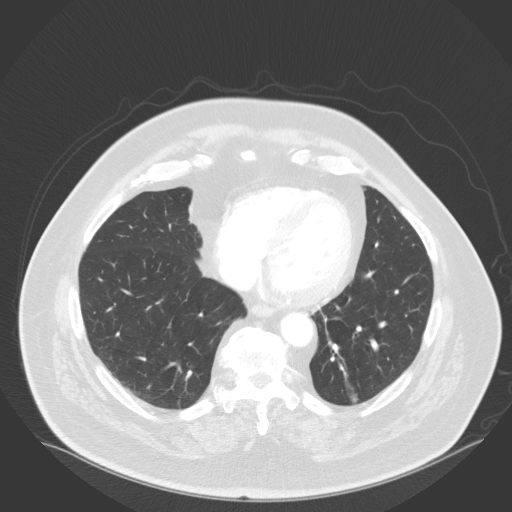
[im 47/116  soft-tissue]
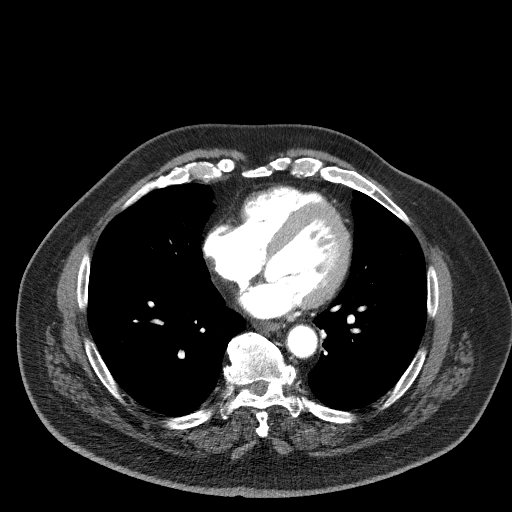
[im 56/116  lung]
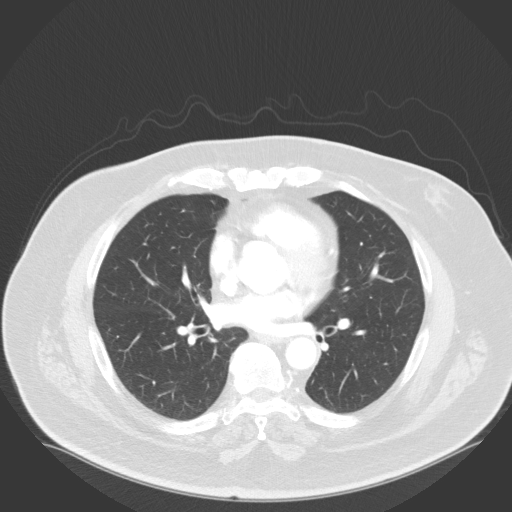
[im 60/116  soft-tissue]
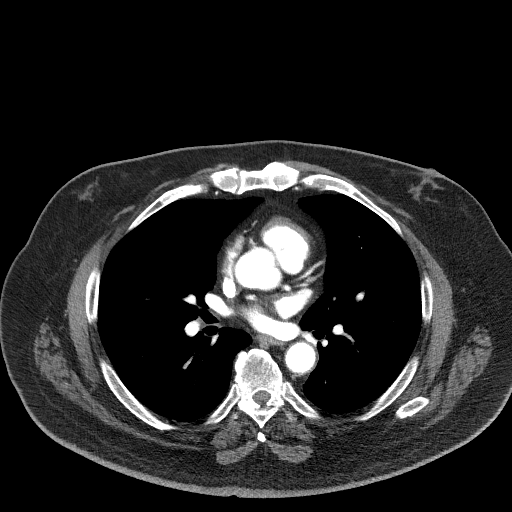
[im 69/116  lung]
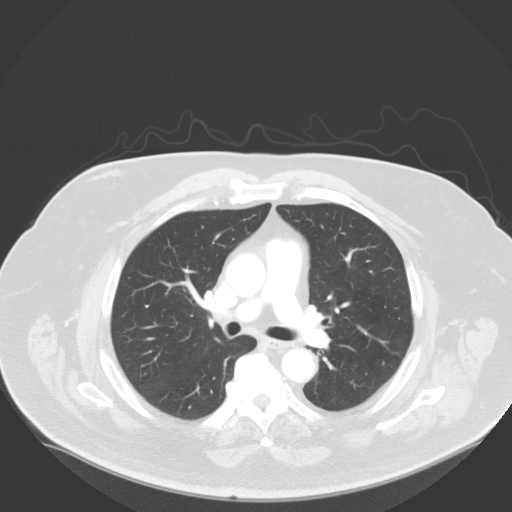
[im 77/116  soft-tissue]
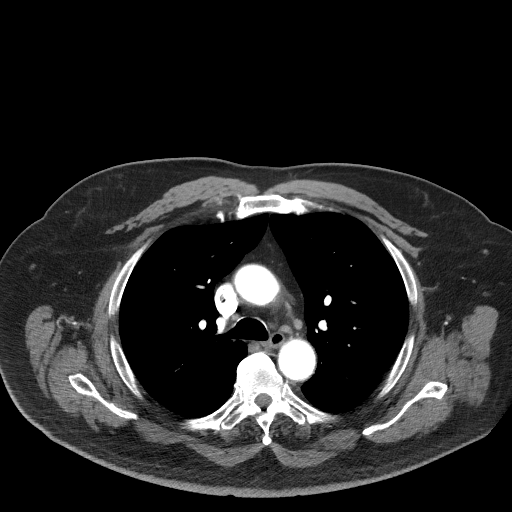
[im 86/116  lung]
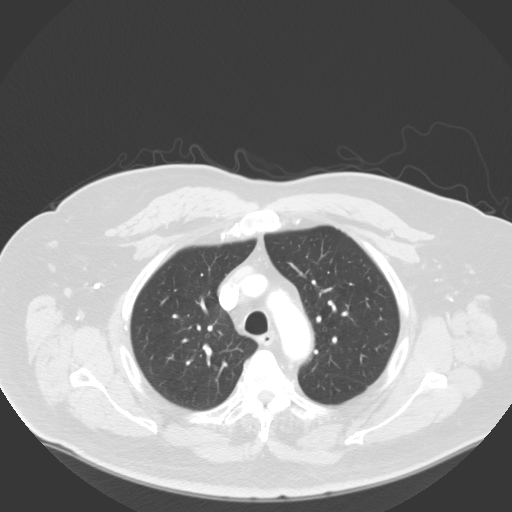
[im 94/116  soft-tissue]
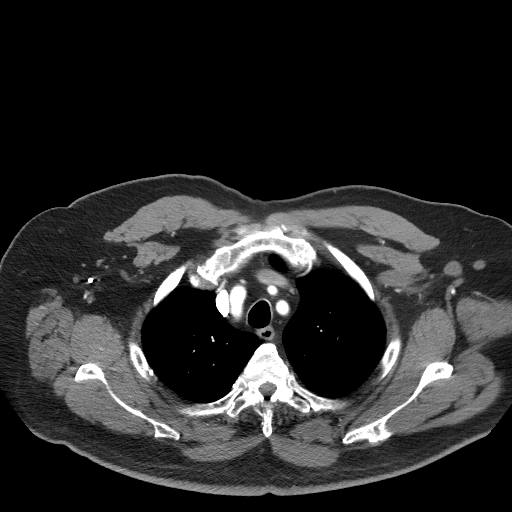
[im 103/116  lung]
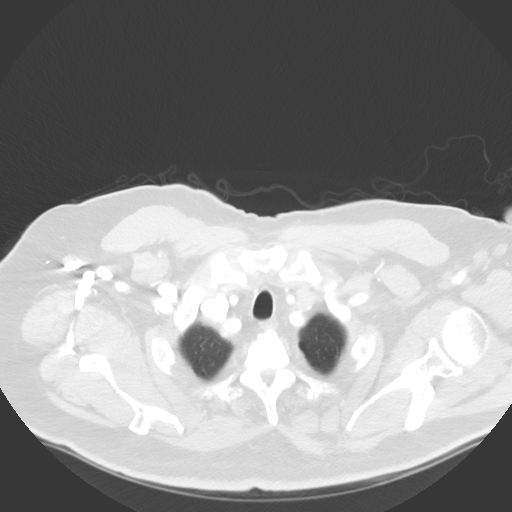
[im 111/116  soft-tissue]
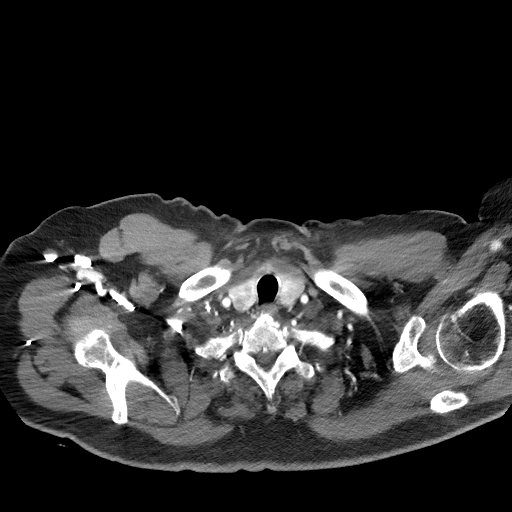

[Series 7: coronals · coronal · 0.71mm/px · 3 of 164 slices shown]
[im 41/164  soft-tissue]
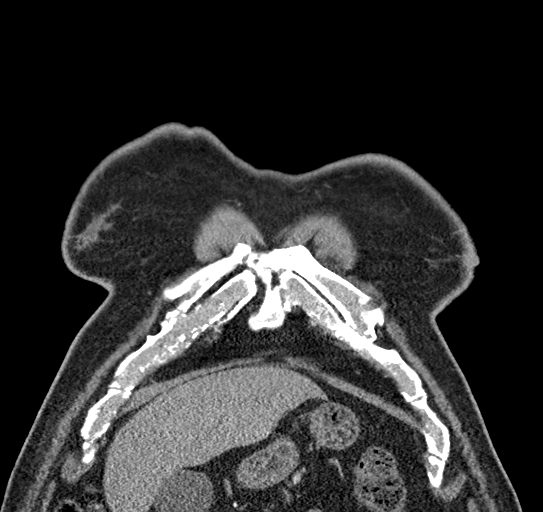
[im 82/164  soft-tissue]
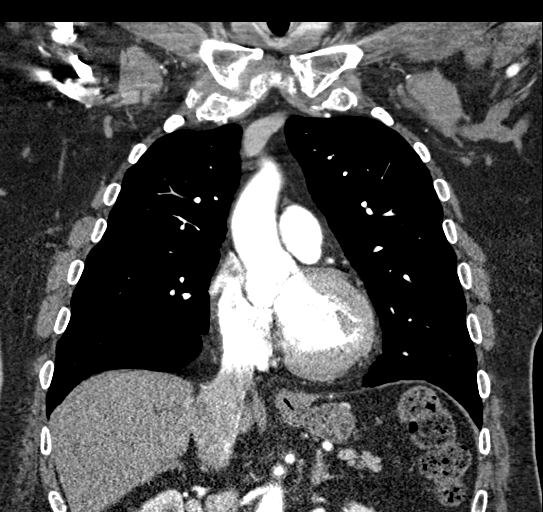
[im 123/164  soft-tissue]
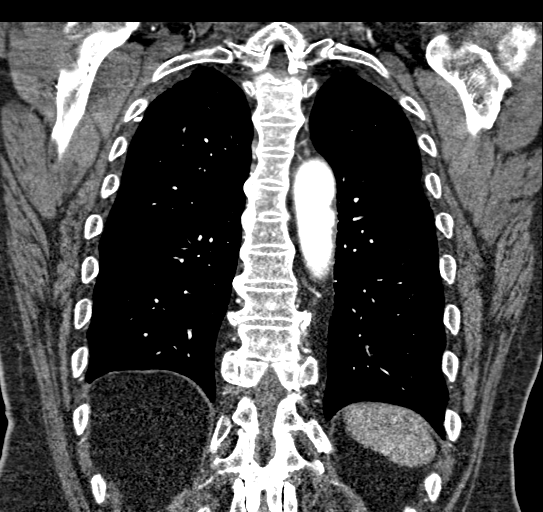

[17 of 46 positions shown; findings below may reference images not displayed]

FINDINGS: Vascular Findings:

The ascending thoracic aorta is of normal caliber. There is mild
ectasia of the proximal aspect of the descending thoracic aorta at
the location of a suspected ductus diverticulum, unchanged compared
to the [DATE] examination.

There is a minimal to moderate amount of slightly irregular
predominantly noncalcified atherosclerotic plaque involving
predominantly the undersurface of the aortic arch as well as the
proximal descending thoracic aorta, including a punctate
(approximately 0.5 cm) penetrating atherosclerotic ulcer involving
the proximal descending thoracic aorta (axial image 45, series 4),
grossly unchanged compared to the [DATE] examination.

No definite evidence of thoracic aortic dissection or periaortic
stranding on this nongated examination.

Bovine configuration of the aortic arch. The branch vessels of the
aortic arch appear widely patent throughout their imaged course.

Normal heart size.  No pericardial effusion.

Although this examination was not tailored for the evaluation the
pulmonary arteries, there are no discrete filling defects within the
central pulmonary arterial tree to suggest central pulmonary
embolism. Normal caliber of the main pulmonary artery.

-------------------------------------------------------------

Thoracic aortic measurements:

Aortic root:

46 mm as measured in greatest oblique coronal diameter (coronal
image 84, series 7), unchanged

Sinotubular junction

31 mm as measured in greatest oblique coronal dimension.

Proximal ascending aorta

34 mm as measured in greatest oblique short axis axial (image 37,
series 4) diameter and approximately 31 mm in greatest oblique short
axis sagittal diameter (image 107, series 8), unchanged compared to
the [DATE] examination.

33 mm as measured in greatest oblique axial dimension at the level
of the main pulmonary artery.

Aortic arch aorta

27 mm as measured in greatest oblique sagittal dimension.

Proximal descending thoracic aorta

28 mm as measured in greatest oblique axial dimension at the level
of the main pulmonary artery.

Distal descending thoracic aorta

24 mm as measured in greatest oblique axial dimension at the level
of the diaphragmatic hiatus.

Review of the MIP images confirms the above findings.

-------------------------------------------------------------

Non-Vascular Findings:

Mediastinum/Lymph Nodes: No bulky mediastinal, hilar or axillary
lymphadenopathy.

Lungs/Pleura: Minimal subsegmental atelectasis within the left lower
lobe. No discrete focal airspace opacities. No pleural effusion or
pneumothorax. The central pulmonary airways appear widely patent. No
discrete pulmonary nodules.

Upper abdomen: Limited early arterial phase evaluation of the upper
abdomen is unremarkable.

Musculoskeletal: No acute or aggressive osseous abnormalities.
Stigmata of DISH throughout the thoracic spine. Heterogeneous
appearing thyroid gland with suspected multiple punctate
(subcentimeter) hypoattenuating nodules bilaterally with dominant
nodule within the inferior pole of the right lobe of the thyroid
measuring approximately 0.7 x 0.7 cm (image 8, series 4),
morphologically similar to the [DATE] examination
IMPRESSION: 1. Stable uncomplicated mild fusiform ectasia of the proximal
descending thoracic aorta at the location of a suspected ductus
diverticulum, morphologically similar to the [DATE] examination.
2. Minimal to moderate amount of slightly irregular predominantly
noncalcified atherosclerotic plaque within thoracic aorta also
morphologically similar to the [DATE] examination.

## 2019-07-14 ENCOUNTER — Other Ambulatory Visit: Payer: Self-pay | Admitting: Internal Medicine

## 2019-07-15 ENCOUNTER — Other Ambulatory Visit (HOSPITAL_COMMUNITY): Payer: Self-pay | Admitting: Cardiology

## 2019-07-15 ENCOUNTER — Other Ambulatory Visit: Payer: Self-pay | Admitting: Internal Medicine

## 2019-08-23 ENCOUNTER — Other Ambulatory Visit (HOSPITAL_COMMUNITY): Payer: Self-pay | Admitting: Cardiology

## 2019-09-05 ENCOUNTER — Telehealth: Payer: Self-pay | Admitting: Cardiovascular Disease

## 2019-09-05 NOTE — Telephone Encounter (Signed)
LVM for patient to call and schedule yearly followup with Dr. Gwenlyn Found.

## 2019-10-11 ENCOUNTER — Telehealth (HOSPITAL_COMMUNITY): Payer: Self-pay

## 2019-10-11 NOTE — Telephone Encounter (Signed)
Attempted to call Patient to discuss his [place] Sleep Study. LMOM in detail for Patient to return call to the office.

## 2019-10-22 ENCOUNTER — Other Ambulatory Visit: Payer: Self-pay | Admitting: Internal Medicine

## 2019-10-22 DIAGNOSIS — H401133 Primary open-angle glaucoma, bilateral, severe stage: Secondary | ICD-10-CM | POA: Diagnosis not present

## 2019-11-06 ENCOUNTER — Other Ambulatory Visit (HOSPITAL_COMMUNITY): Payer: Self-pay | Admitting: Cardiology

## 2019-11-14 ENCOUNTER — Encounter: Payer: Self-pay | Admitting: Internal Medicine

## 2019-11-14 ENCOUNTER — Ambulatory Visit (INDEPENDENT_AMBULATORY_CARE_PROVIDER_SITE_OTHER): Payer: Medicare Other | Admitting: Internal Medicine

## 2019-11-14 ENCOUNTER — Other Ambulatory Visit: Payer: Self-pay

## 2019-11-14 VITALS — BP 142/80 | HR 59 | Temp 98.6°F | Resp 14 | Ht 75.0 in | Wt 281.0 lb

## 2019-11-14 DIAGNOSIS — I10 Essential (primary) hypertension: Secondary | ICD-10-CM | POA: Diagnosis not present

## 2019-11-14 DIAGNOSIS — R7302 Impaired glucose tolerance (oral): Secondary | ICD-10-CM | POA: Diagnosis not present

## 2019-11-14 DIAGNOSIS — H6092 Unspecified otitis externa, left ear: Secondary | ICD-10-CM

## 2019-11-14 DIAGNOSIS — J439 Emphysema, unspecified: Secondary | ICD-10-CM | POA: Diagnosis not present

## 2019-11-14 DIAGNOSIS — E7800 Pure hypercholesterolemia, unspecified: Secondary | ICD-10-CM

## 2019-11-14 DIAGNOSIS — I5022 Chronic systolic (congestive) heart failure: Secondary | ICD-10-CM

## 2019-11-14 LAB — BASIC METABOLIC PANEL
BUN: 10 mg/dL (ref 6–23)
CO2: 25 mEq/L (ref 19–32)
Calcium: 9.4 mg/dL (ref 8.4–10.5)
Chloride: 102 mEq/L (ref 96–112)
Creatinine, Ser: 0.93 mg/dL (ref 0.40–1.50)
GFR: 94.42 mL/min (ref >60.00)
Glucose, Bld: 88 mg/dL (ref 70–99)
Potassium: 4.1 mEq/L (ref 3.5–5.1)
Sodium: 137 mEq/L (ref 135–145)

## 2019-11-14 LAB — LIPID PANEL
Cholesterol: 119 mg/dL (ref 0–200)
HDL: 42.6 mg/dL (ref >39.00)
LDL Cholesterol: 64 mg/dL (ref 0–99)
NonHDL: 76.57
Total CHOL/HDL Ratio: 3
Triglycerides: 64 mg/dL (ref 0.0–149.0)
VLDL: 12.8 mg/dL (ref 0.0–40.0)

## 2019-11-14 LAB — HEPATIC FUNCTION PANEL
ALT: 12 U/L (ref 0–53)
AST: 16 U/L (ref 0–37)
Albumin: 4.3 g/dL (ref 3.5–5.2)
Alkaline Phosphatase: 62 U/L (ref 39–117)
Bilirubin, Direct: 0.2 mg/dL (ref 0.0–0.3)
Total Bilirubin: 0.7 mg/dL (ref 0.2–1.2)
Total Protein: 6.6 g/dL (ref 6.0–8.3)

## 2019-11-14 LAB — HEMOGLOBIN A1C: Hgb A1c MFr Bld: 5.6 % (ref 4.6–6.5)

## 2019-11-14 MED ORDER — NEOMYCIN-POLYMYXIN-HC 3.5-10000-1 OT SOLN: 4.0000 [drp] | Freq: Four times a day (QID) | OTIC | 0 refills | Status: AC

## 2019-11-14 NOTE — Progress Notes (Signed)
Subjective:    Patient ID: Dylan Benton, male    DOB: [birth year], 78 y.o.   MRN: [id number]  HPI   Here to f/u; overall doing ok,  Pt denies chest pain, increasing sob or doe, wheezing, orthopnea, PND, increased LE swelling, palpitations  Pt denies new neurological symptoms such as new headache, or facial or extremity weakness or numbness.  Pt denies polydipsia, polyuria, or low sugar episode.  Pt states overall good compliance with meds, mostly trying to follow appropriate diet, with wt overall stable,  but little exercise however. Did have an episode of syncope 2 mo ago with fall without injury, declines ED at the time, no symptoms since then.  Also with left ear pain x 3 days, with low grade temp, and slight d/c, mild to mod, constant, dull, nothing seems to make better or worse Past Medical History:  Diagnosis Date  . Allergy   . Anemia, unspecified    NOS ?resolved  . Anxiety   . Aortic root dilation (HCC)   . Arthritis   . ASTHMA, UNSPECIFIED, UNSPECIFIED STATUS 08/06/2009   Annotation: No exacerbations.  Qualifier: Diagnosis of  By: Kelton Pillar MD, [place]    . Bladder calculus 05/05/2012  . Cataract   . Chest pain   . ECHOCARDIOGRAM, ABNORMAL 12/26/2006    Aortic root dilation  This problem surfaced in 2006 after a cardiology referral for chest pain.  He was seen by Dr. Haroldine Laws in January o6 and cathed on Jan. 18.  Coronaries were nl and EF was 65%.  Echo showed mild aortic root dilation of 19mm. He was started on metoprolol (although I notice that he is no longer on this.  Annual echo to follow aortic root was advised.  Aortic root dimension was unchanged on studies in 2009 and 2010.  Will continue to follow this, perhaps not every year as it seems stable.   . Erectile dysfunction 05/05/2012  . GERD (gastroesophageal reflux disease) 05/05/2012  . H pylori ulcer    teated H pylori  . History of back surgery   . Hyperlipidemia 05/08/2012  . Hypertension   . Impaired glucose tolerance  05/05/2012  . Lumbar disc disease 05/08/2012  . Male stress incontinence 05/05/2012  . Myocardial infarction [facility])    pt states, "i've been told I have had two heart attacks  . Obesity   . OBESITY 10/06/2006   Qualifier: Diagnosis of  By: Stann Mainland MD, Nicole Kindred    . Prostate cancer ([place])    Hx of   . PROSTATE CANCER, HX OF 08/06/2009   Annotation: prostatectomy and radiotherapy in 1990s,  Dr. Jeffie Pollock Qualifier: Diagnosis of  By: Kelton Pillar MD, [place]    . PUD (peptic ulcer disease) 05/05/2012  . Systolic CHF ([place]) 123456  . Thoracic aortic aneurysm ([place])   . 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 Edilia Bo Qualifier: Diagnosis of  By: Kelton Pillar MD, [place]     Past Surgical History:  Procedure Laterality Date  . BACK SURGERY    . ear surgury    . eye surgury    . LEFT HEART CATH AND CORONARY ANGIOGRAPHY N/A 02/06/2018   Procedure: LEFT HEART CATH AND CORONARY ANGIOGRAPHY;  Surgeon: Larey Dresser, MD;  Location: [facility];  Service: Cardiovascular;  Laterality: N/A;  . PROSTATECTOMY      reports that he quit smoking about 30 years ago. He has never used smokeless tobacco. He reports that he does not drink alcohol or  use drugs. family history includes Coronary artery disease in an other family member; Hypertension in an other family member; Stroke in an other family member. No Known Allergies Current Outpatient Medications on File Prior to Visit  Medication Sig Dispense Refill  . albuterol (PROVENTIL HFA;VENTOLIN HFA) 108 (90 Base) MCG/ACT inhaler TAKE 2 PUFFS BY MOUTH EVERY 6 HOURS AS NEEDED FOR WHEEZE OR SHORTNESS OF BREATH 8.5 Inhaler 5  . aspirin 81 MG EC tablet TAKE 1 TABLET BY MOUTH EVERY DAY 90 tablet 3  . carvedilol (COREG) 6.25 MG tablet TAKE 1 TABLET (6.25 MG TOTAL) BY MOUTH 2 (TWO) TIMES DAILY WITH A MEAL. 180 tablet 1  . COMBIGAN 0.2-0.5 % ophthalmic solution Place 1 drop into both eyes every 12 (twelve) hours.     Marland Kitchen ENTRESTO 49-51 MG  TAKE 1 TABLET BY MOUTH TWICE A DAY 180 tablet 1  . furosemide (LASIX) 20 MG tablet Take 1 tablet (20 mg total) by mouth daily. 30 tablet 6  . furosemide (LASIX) 40 MG tablet TAKE 1 TABLET EVERY MORNING AND TAKE 1 TABLET EVERY EVENING FOR PERSISTENT LEG SWELLING AS NEEDED 180 tablet 1  . meloxicam (MOBIC) 7.5 MG tablet TAKE 1 TABLET BY MOUTH EVERY DAY 90 tablet 0  . rosuvastatin (CRESTOR) 10 MG tablet TAKE 1 TABLET BY MOUTH EVERY DAY 90 tablet 0  . spironolactone (ALDACTONE) 25 MG tablet TAKE 1 TABLET BY MOUTH EVERY DAY 90 tablet 3  . tiotropium (SPIRIVA HANDIHALER) 18 MCG inhalation capsule Place 1 capsule (18 mcg total) into inhaler and inhale daily. 30 capsule 12   No current facility-administered medications on file prior to visit.   Review of Systems  Constitutional: Negative for other unusual diaphoresis or sweats HENT: Negative for ear discharge or swelling Eyes: Negative for other worsening visual disturbances Respiratory: Negative for stridor or other swelling  Gastrointestinal: Negative for worsening distension or other blood Genitourinary: Negative for retention or other urinary change Musculoskeletal: Negative for other MSK pain or swelling Skin: Negative for color change or other new lesions Neurological: Negative for worsening tremors and other numbness  Psychiatric/Behavioral: Negative for worsening agitation or other fatigue All otherwise neg per pt     Objective:   Physical Exam BP (!) 142/80   Pulse (!) 59   Temp 98.6 F (37 C)   Resp 14   Ht 6\' 3"  (1.905 m)   Wt 281 lb (127.5 kg)   SpO2 99%   BMI 35.12 kg/m  VS noted,  Constitutional: Pt appears in NAD HENT: Head: NCAT.  Right Ear: External ear normal.  Left Ear: External ear normal.  Eyes: . Pupils are equal, round, and reactive to light. Conjunctivae and EOM are normal Left canal with 1+ red, tender, swelling and slight d/c Nose: without d/c or deformity Neck: Neck supple. Gross normal  ROM Cardiovascular: Normal rate and regular rhythm.   Pulmonary/Chest: Effort normal and breath sounds without rales or wheezing.  Abd:  Soft, NT, ND, + BS, no organomegaly Neurological: Pt is alert. At baseline orientation, motor grossly intact Skin: Skin is warm. No rashes, other new lesions, no LE edema Psychiatric: Pt behavior is normal without agitation  All otherwise neg per pt Lab Results  Component Value Date   WBC 4.0 05/16/2019   HGB 13.7 05/16/2019   HCT 41.1 05/16/2019   PLT 163.0 05/16/2019   GLUCOSE 88 11/14/2019   CHOL 119 11/14/2019   TRIG 64.0 11/14/2019   HDL 42.60 11/14/2019   LDLCALC 64 11/14/2019  ALT 12 11/14/2019   AST 16 11/14/2019   NA 137 11/14/2019   K 4.1 11/14/2019   CL 102 11/14/2019   CREATININE 0.93 11/14/2019   BUN 10 11/14/2019   CO2 25 11/14/2019   TSH 1.62 05/16/2019   PSA 1.65 11/09/2017   INR 1.09 01/30/2018   HGBA1C 5.6 11/14/2019      Assessment & Plan:

## 2019-11-14 NOTE — Patient Instructions (Addendum)
Please take all new medication as prescribed - the ear drops for the left ear  Please continue all other medications as before, and refills have been done if requested.  Please have the pharmacy call with any other refills you may need.  Please continue your efforts at being more active, low cholesterol diet, and weight control  Please keep your appointments with your specialists as you may have planned  Please go to the LAB at the blood drawing area for the tests to be done  You will be contacted by phone if any changes need to be made immediately.  Otherwise, you will receive a letter about your results with an explanation, but please check with MyChart first.  Please return in 6 months, or sooner if needed

## 2019-11-23 ENCOUNTER — Encounter: Payer: Self-pay | Admitting: Internal Medicine

## 2019-11-23 NOTE — Assessment & Plan Note (Signed)
stable overall by history and exam, recent data reviewed with pt, and pt to continue medical treatment as before,  to f/u any worsening symptoms or concerns  

## 2019-11-23 NOTE — Assessment & Plan Note (Signed)
Mild to mod, for antibx course,  to f/u any worsening symptoms or concerns 

## 2019-12-03 DIAGNOSIS — H401133 Primary open-angle glaucoma, bilateral, severe stage: Secondary | ICD-10-CM | POA: Diagnosis not present

## 2019-12-25 ENCOUNTER — Telehealth: Payer: Self-pay | Admitting: *Deleted

## 2019-12-25 NOTE — Telephone Encounter (Signed)
A message was left, re: his follow up visit. 

## 2020-01-07 ENCOUNTER — Telehealth (HOSPITAL_COMMUNITY): Payer: Self-pay

## 2020-01-07 NOTE — Telephone Encounter (Signed)

## 2020-01-08 ENCOUNTER — Ambulatory Visit (HOSPITAL_COMMUNITY)
Admission: RE | Admit: 2020-01-08 | Discharge: 2020-01-08 | Disposition: A | Discharge status: Home / Self Care | Payer: Medicare Other | Source: Ambulatory Visit | Attending: Cardiology | Admitting: Cardiology

## 2020-01-08 ENCOUNTER — Other Ambulatory Visit: Payer: Self-pay

## 2020-01-08 ENCOUNTER — Encounter (HOSPITAL_COMMUNITY): Payer: Self-pay | Admitting: Cardiology

## 2020-01-08 VITALS — BP 140/80 | HR 55 | Wt 284.4 lb

## 2020-01-08 DIAGNOSIS — I110 Hypertensive heart disease with heart failure: Secondary | ICD-10-CM | POA: Insufficient documentation

## 2020-01-08 DIAGNOSIS — M79662 Pain in left lower leg: Secondary | ICD-10-CM | POA: Insufficient documentation

## 2020-01-08 DIAGNOSIS — I739 Peripheral vascular disease, unspecified: Secondary | ICD-10-CM

## 2020-01-08 DIAGNOSIS — Z79899 Other long term (current) drug therapy: Secondary | ICD-10-CM | POA: Diagnosis not present

## 2020-01-08 DIAGNOSIS — I2510 Atherosclerotic heart disease of native coronary artery without angina pectoris: Secondary | ICD-10-CM | POA: Insufficient documentation

## 2020-01-08 DIAGNOSIS — J449 Chronic obstructive pulmonary disease, unspecified: Secondary | ICD-10-CM | POA: Diagnosis not present

## 2020-01-08 DIAGNOSIS — R0683 Snoring: Secondary | ICD-10-CM | POA: Diagnosis not present

## 2020-01-08 DIAGNOSIS — Z791 Long term (current) use of non-steroidal anti-inflammatories (NSAID): Secondary | ICD-10-CM | POA: Diagnosis not present

## 2020-01-08 DIAGNOSIS — Z87891 Personal history of nicotine dependence: Secondary | ICD-10-CM | POA: Insufficient documentation

## 2020-01-08 DIAGNOSIS — Z7982 Long term (current) use of aspirin: Secondary | ICD-10-CM | POA: Insufficient documentation

## 2020-01-08 DIAGNOSIS — I428 Other cardiomyopathies: Secondary | ICD-10-CM | POA: Insufficient documentation

## 2020-01-08 DIAGNOSIS — R001 Bradycardia, unspecified: Secondary | ICD-10-CM | POA: Diagnosis not present

## 2020-01-08 DIAGNOSIS — I5022 Chronic systolic (congestive) heart failure: Secondary | ICD-10-CM | POA: Diagnosis not present

## 2020-01-08 DIAGNOSIS — E785 Hyperlipidemia, unspecified: Secondary | ICD-10-CM | POA: Insufficient documentation

## 2020-01-08 DIAGNOSIS — Z8249 Family history of ischemic heart disease and other diseases of the circulatory system: Secondary | ICD-10-CM | POA: Insufficient documentation

## 2020-01-08 LAB — BASIC METABOLIC PANEL
Anion gap: 7 (ref 5–15)
BUN: 9 mg/dL (ref 8–23)
CO2: 26 mmol/L (ref 22–32)
Calcium: 9.2 mg/dL (ref 8.9–10.3)
Chloride: 107 mmol/L (ref 98–111)
Creatinine, Ser: 1.18 mg/dL (ref 0.61–1.24)
GFR calc Af Amer: >60 mL/min (ref >60)
GFR calc non Af Amer: 58 mL/min — ABNORMAL LOW (ref >60)
Glucose, Bld: 144 mg/dL — ABNORMAL HIGH (ref 70–99)
Potassium: 4 mmol/L (ref 3.5–5.1)
Sodium: 140 mmol/L (ref 135–145)

## 2020-01-08 MED ORDER — SACUBITRIL-VALSARTAN 97-103 MG PO TABS: 1.0000 | ORAL_TABLET | Freq: Two times a day (BID) | ORAL | 4 refills | Status: DC

## 2020-01-08 NOTE — Patient Instructions (Addendum)
INCREASE Entresto to 97/103mg  (1 tab) twice a day   Labs today We will only contact you if something comes back abnormal or we need to make some changes. Otherwise no news is good news!    Your physician has requested that you have an ankle brachial index (ABI). During this test an ultrasound and blood pressure cuff are used to evaluate the arteries that supply the arms and legs with blood. Allow thirty minutes for this exam. There are no restrictions or special instructions.   Your provider has recommended that you have a home sleep study.  [employer] is the company that does these test.  They will contact you by phone and must speak with you before they can ship the equipment.  Once they have spoken with you they will send the equipment right to your home with instructions on how to set it up.  Once you have completed the test simply box all the equipment back up and mail back to the company.  IF you have any questions or issues with the equipment please call the company directly at [phone].  If your test is positive for sleep apnea and you need a home CPAP machine you will be contacted by Dr Theodosia Blender office [facility]) to set this up.   Your physician recommends that you schedule a follow-up appointment in:   Labs: Friday February 26th, 2021 at 60 noon [place]  MD Appointment: Tuesday June 8th, 2021 at 11:00am Garage code [id number]   Please call office at [phone] option 2 if you have any questions or concerns.    At the [facility], you and your health needs are our priority. As part of our continuing mission to provide you with exceptional heart care, we have created designated Provider Care Teams. These Care Teams include your primary Cardiologist (physician) and Advanced Practice Providers (APPs- Physician Assistants and Nurse Practitioners) who all work together to provide you with the care you need, when you need it.   You may see any of the  following providers on your designated Care Team at your next follow up: Marland Kitchen Dr Glori Bickers . Dr Loralie Champagne . Darrick Grinder, NP . Lyda Jester, PA . Audry Riles, PharmD   Please be sure to bring in all your medications bottles to every appointment.

## 2020-01-08 NOTE — Progress Notes (Addendum)
Patient Name: Dylan Benton        DOB: Mar 31, 2041      Height:  6'2    Weight: 284 lb  Office Name: [facility]          Referring Provider: Loralie Champagne   Today's Date: 01/08/20  Date:   STOP BANG RISK ASSESSMENT S (snore) Have you been told that you snore?     NO   T (tired) Are you often tired, fatigued, or sleepy during the day?   YES  O (obstruction) Do you stop breathing, choke, or gasp during sleep? NO   P (pressure) Do you have or are you being treated for high blood pressure? YES   B (BMI) Is your body index greater than 35 kg/m? YES   A (age) Are you 81 years old or older? YES   N (neck) Do you have a neck circumference greater than 16 inches?   NA  G (gender) Are you a male? YES   TOTAL STOP/BANG "YES" ANSWERS 5                                                                       For Office Use Only              Procedure Order Form    YES to 3+ Stop Bang questions OR two clinical symptoms - patient qualifies for WatchPAT (CPT 95800)     Submit: This Form + Patient Face Sheet + Clinical Note via CloudPAT or Fax: [phone]         Clinical Notes: Will consult Sleep Specialist and refer for management of therapy due to patient increased risk of Sleep Apnea. Ordering a sleep study due to the following two clinical symptoms: Excessive daytime sleepiness G47.10  /History of high blood pressure R03.0   I understand that I am proceeding with a home sleep apnea test as ordered by my treating physician. I understand that untreated sleep apnea is a serious cardiovascular risk factor and it is my responsibility to perform the test and seek management for sleep apnea. I will be contacted with the results and be managed for sleep apnea by a local sleep physician. I will be receiving equipment and further instructions from [facility]. I shall promptly ship back the equipment via the included mailing label. I understand my insurance will be  billed for the test and as the patient I am responsible for any insurance related out-of-pocket costs incurred. I have been provided with written instructions and can call for additional video or telephonic instruction, with 24-hour availability of qualified personnel to answer any questions: Patient Help Desk [phone].  Patient Signature ______________________________________________________   Date______________________ Patient Telemedicine Verbal Consent

## 2020-01-09 ENCOUNTER — Telehealth (HOSPITAL_COMMUNITY): Payer: Self-pay

## 2020-01-09 NOTE — Progress Notes (Signed)
PCP: Dr. Jenny Reichmann Cardiology: Dr. Gwenlyn Found HF Cardiology: Dr. Aundra Dubin  79 y.o. with history of HTN and cardiomyopathy of uncertain etiology was referred by Dr. Gwenlyn Found for evaluation of dyspnea and CHF.  Patient was first found to have decreased systolic function in 0000000 when echo showed EF 45-50%. At that time, he had mild exertional dyspnea. Last summer, he had gotten to the point where he had to take frequent breaks mowing the lawn.  However, since the beginning of this year, he has had worsening dyspnea.  Echo was done in 1/19, showing EF down to 35-40%.  Cardiolite did not show a definite perfusion defect. 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 Cardiac MRI in 4/19 showed EF 46%, LGE pattern that was concerning for myocarditis. PYP scan was negative.   Echo in 7/20 showed  EF 45%, RV normal.   He returns for followup of CHF.  BP mildly elevated today.  Weight down 2 lbs.  He is short of breath after walking about 150 feet.  This has been gradually worse, but he has had very little exercise recently due to coronavirus pandemic.  He does try to ride his exercise bike occasionally.  Left calf feels "stiff" frequently.  No lightheadedness.  No chest pain.  He had a vagal-type episode with micturation a few weeks ago (did not pass out but had to lie on bed).   Labs (12/18): LDL 47 Labs (3/19): K 4.3, creatinine 1.09 Labs (4/19): Myeloma pattern negative Labs (5/19): K 4.6, creatinine 1.06 Labs (6/19): LDL 48, HDl 42, K 4, creatinine 1.15 Labs (6/20): LDL 51, HDl 37, K 4.4, creatinine 1.12, LFTs normal, TSH normal  PMH: 1. Low back pain 2. Cardiomyopathy: LHC in 2006 with normal coronaries.  Echo in 1/17 with EF 45-50%.  - Echo (1/19): EF 35-40%, mild LVH - Cardiolite (2/19): EF 41%, no ischemia/infarction.  - LHC (3/19): 50% ostial LAD, 50% ramus. LVEDP 12.  - Cardiac MRI (4/19): EF 46%, normal RV size and systolic function, LGE pattern suggestive of prior myocarditis.  - PYP scan (4/19): Negative, not  suggestive of TTR amyloidosis. - Myeloma panel negative - Echo (7/20): EF 45%, RV normal, IVC normal 3. HTN 4. Hyperlipidemia 5. CAD: LHC (3/19) with 50% ostial LAD, 50% ramus. 6. COPD: Prior smoker.  PFTs 11/19 with moderate obstruction.  7. Vasovagal syncope/presyncope.   Social History   Socioeconomic History  . Marital status: Married    Spouse name: Not on file  . Number of children: 2  . Years of education: 16  . Highest education level: Not on file  Occupational History  . Occupation: Camera operator (Part-time)  Tobacco Use  . Smoking status: Former Smoker    Quit date: 12/02/1988    Years since quitting: 31.1  . Smokeless tobacco: Never Used  Substance and Sexual Activity  . Alcohol use: No  . Drug use: No  . Sexual activity: Not on file  Other Topics Concern  . Not on file  Social History Narrative   Works for [employer] at [employer] in [place].   Social Determinants of Health   Financial Resource Strain:   . Difficulty of Paying Living Expenses: Not on file  Food Insecurity:   . Worried About Charity fundraiser in the Last Year: Not on file  . Ran Out of Food in the Last Year: Not on file  Transportation Needs:   . Lack of Transportation (Medical): Not on file  . Lack of Transportation (Non-Medical): Not on file  Physical Activity:   . Days of Exercise per Week: Not on file  . Minutes of Exercise per Session: Not on file  Stress:   . Feeling of Stress : Not on file  Social Connections:   . Frequency of Communication with Friends and Family: Not on file  . Frequency of Social Gatherings with Friends and Family: Not on file  . Attends Religious Services: Not on file  . Active Member of Clubs or Organizations: Not on file  . Attends Archivist Meetings: Not on file  . Marital Status: Not on file  Intimate Partner Violence:   . Fear of Current or Ex-Partner: Not on file  . Emotionally Abused: Not on file  . Physically Abused: Not on file  .  Sexually Abused: Not on file   Family History  Problem Relation Age of Onset  . Coronary artery disease Other   . Hypertension Other   . Stroke Other   . Colon cancer Neg Hx   . Esophageal cancer Neg Hx   . Rectal cancer Neg Hx   . Stomach cancer Neg Hx    ROS: All systems reviewed and negative except as per HPI.  Current Outpatient Medications  Medication Sig Dispense Refill  . albuterol (PROVENTIL HFA;VENTOLIN HFA) 108 (90 Base) MCG/ACT inhaler TAKE 2 PUFFS BY MOUTH EVERY 6 HOURS AS NEEDED FOR WHEEZE OR SHORTNESS OF BREATH 8.5 Inhaler 5  . aspirin 81 MG EC tablet TAKE 1 TABLET BY MOUTH EVERY DAY 90 tablet 3  . carvedilol (COREG) 6.25 MG tablet TAKE 1 TABLET (6.25 MG TOTAL) BY MOUTH 2 (TWO) TIMES DAILY WITH A MEAL. 180 tablet 1  . dorzolamide-timolol (COSOPT) 22.3-6.8 MG/ML ophthalmic solution Place 1 drop into both eyes 2 times daily.    . furosemide (LASIX) 20 MG tablet Take 1 tablet (20 mg total) by mouth daily. 30 tablet 6  . latanoprost (XALATAN) 0.005 % ophthalmic solution INSTILL 1 DROP INTO BOTH EYES EVERY DAY AT NIGHT    . meloxicam (MOBIC) 7.5 MG tablet TAKE 1 TABLET BY MOUTH EVERY DAY 90 tablet 0  . rosuvastatin (CRESTOR) 10 MG tablet TAKE 1 TABLET BY MOUTH EVERY DAY 90 tablet 0  . spironolactone (ALDACTONE) 25 MG tablet TAKE 1 TABLET BY MOUTH EVERY DAY 90 tablet 3  . tiotropium (SPIRIVA HANDIHALER) 18 MCG inhalation capsule Place 1 capsule (18 mcg total) into inhaler and inhale daily. (Patient taking differently: Place 18 mcg into inhaler and inhale daily as needed. ) 30 capsule 12  . sacubitril-valsartan (ENTRESTO) 97-103 MG Take 1 tablet by mouth 2 (two) times daily. 60 tablet 4   No current facility-administered medications for this encounter.   BP 140/80   Pulse (!) 55   Wt 129 kg (284 lb 6.4 oz)   SpO2 99%   BMI 35.55 kg/m  General: NAD Neck: No JVD, no thyromegaly or thyroid nodule.  Lungs: Clear to auscultation bilaterally with normal respiratory effort. CV:  Nondisplaced PMI.  Heart regular S1/S2, no S3/S4, no murmur.  No peripheral edema.  No carotid bruit.  Trace left PT pulse.  Abdomen: Soft, nontender, no hepatosplenomegaly, no distention.  Skin: Intact without lesions or rashes.  Neurologic: Alert and oriented x 3.  Psych: Normal affect. Extremities: No clubbing or cyanosis.  HEENT: Normal.   Assessment/Plan: 1. Chronic systolic CHF: Echo in [id number] with EF 35-40%, mild LVH. LHC in 3/19 with nonobstructive CAD.  Cardiac MRI in 4/19 showed EF 46%, LGE pattern concerning for prior myocarditis.  PYP scan was negative, not suggestive of TTR amyloidosis. Myeloma panel negative.  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 Echo in 7/20 showed EF remains 45%.  On exam, he is not volume overloaded and weight is down 2 lbs.  NYHA class II-III symptoms, stable.  - Continue Coreg 6.25 mg bid.  - With BP still elevated, increase Entresto to 97/103 mg bid. BMET today and in 10 days.  - Continue Lasix 20 mg daily for now.   - Continue spironolactone 25 mg daily.  2. HTN: BP still up, increasing Entresto as above.  3. Hyperlipidemia.  Continue Crestor, good lipids in 6/20.  4. COPD: Moderate by PFTs.  5. Suspect OSA: Daytime sleepiness/fatigue.  - Home sleep study.   6. Left calf pain: ?Claudication.  I will arrange for ABIs.   Followup 4 months.    Loralie Champagne 01/09/2020

## 2020-01-09 NOTE — Telephone Encounter (Signed)
Order, OV note, stop bang and demographics all faxed to [facility] at [phone] via epic  

## 2020-01-16 ENCOUNTER — Other Ambulatory Visit: Payer: Self-pay

## 2020-01-16 ENCOUNTER — Telehealth: Payer: Self-pay

## 2020-01-16 MED ORDER — MELOXICAM 7.5 MG PO TABS: 7.5000 mg | ORAL_TABLET | Freq: Every day | ORAL | 3 refills | Status: DC

## 2020-01-16 MED ORDER — ROSUVASTATIN CALCIUM 10 MG PO TABS: 10.0000 mg | ORAL_TABLET | Freq: Every day | ORAL | 0 refills | Status: DC

## 2020-01-16 NOTE — Telephone Encounter (Signed)
none

## 2020-01-18 ENCOUNTER — Other Ambulatory Visit: Payer: Self-pay

## 2020-01-18 ENCOUNTER — Ambulatory Visit (HOSPITAL_COMMUNITY)
Admission: RE | Admit: 2020-01-18 | Discharge: 2020-01-18 | Disposition: A | Discharge status: Home / Self Care | Payer: Medicare Other | Source: Ambulatory Visit | Attending: Cardiology | Admitting: Cardiology

## 2020-01-18 DIAGNOSIS — I5022 Chronic systolic (congestive) heart failure: Secondary | ICD-10-CM

## 2020-01-18 LAB — BASIC METABOLIC PANEL
Anion gap: 7 (ref 5–15)
BUN: 11 mg/dL (ref 8–23)
CO2: 28 mmol/L (ref 22–32)
Calcium: 9.3 mg/dL (ref 8.9–10.3)
Chloride: 106 mmol/L (ref 98–111)
Creatinine, Ser: 1.5 mg/dL — ABNORMAL HIGH (ref 0.61–1.24)
GFR calc Af Amer: 50 mL/min — ABNORMAL LOW (ref >60)
GFR calc non Af Amer: 43 mL/min — ABNORMAL LOW (ref >60)
Glucose, Bld: 95 mg/dL (ref 70–99)
Potassium: 4.2 mmol/L (ref 3.5–5.1)
Sodium: 141 mmol/L (ref 135–145)

## 2020-01-21 ENCOUNTER — Telehealth (HOSPITAL_COMMUNITY): Payer: Self-pay | Admitting: *Deleted

## 2020-01-21 DIAGNOSIS — I5022 Chronic systolic (congestive) heart failure: Secondary | ICD-10-CM

## 2020-01-21 MED ORDER — FUROSEMIDE 20 MG PO TABS: 20.0000 mg | ORAL_TABLET | ORAL | 6 refills | Status: DC

## 2020-01-21 NOTE — Telephone Encounter (Signed)
Dylan Benton, [place]  01/21/2020 9:47 AM EST    Pt returned call he is aware and agreeable with plan. Lab appt scheduled.    Valeda Malm, RN  01/21/2020 9:01 AM EST    LM for patient to return call to office   Larey Dresser, MD  01/19/2020 9:59 AM EST    Decrease Lasix to 20 mg every other day, repeat BMET 1 week.

## 2020-01-21 NOTE — Telephone Encounter (Signed)
-----   Message from Larey Dresser, MD sent at 01/19/2020  9:59 AM EST ----- Decrease Lasix to 20 mg every other day, repeat BMET 1 week.

## 2020-01-22 ENCOUNTER — Other Ambulatory Visit (HOSPITAL_COMMUNITY): Payer: Self-pay | Admitting: Cardiology

## 2020-01-22 ENCOUNTER — Ambulatory Visit (HOSPITAL_COMMUNITY)
Admission: RE | Admit: 2020-01-22 | Discharge: 2020-01-22 | Disposition: A | Discharge status: Home / Self Care | Payer: Medicare Other | Source: Ambulatory Visit | Attending: Cardiology | Admitting: Cardiology

## 2020-01-22 ENCOUNTER — Other Ambulatory Visit: Payer: Self-pay

## 2020-01-22 DIAGNOSIS — I739 Peripheral vascular disease, unspecified: Secondary | ICD-10-CM

## 2020-01-24 ENCOUNTER — Ambulatory Visit: Payer: Medicare Other | Attending: Internal Medicine

## 2020-01-24 DIAGNOSIS — Z23 Encounter for immunization: Secondary | ICD-10-CM

## 2020-01-24 NOTE — Progress Notes (Signed)
   Covid-19 Vaccination Clinic  Name:  Dylan Benton    MRN: [id number] DOB: Aug 28, 1941  01/24/2020  Dylan Benton was observed post Covid-19 immunization for 15 minutes without incident. He was provided with Vaccine Information Sheet and instruction to access the V-Safe system.   Dylan Benton was instructed to call 911 with any severe reactions post vaccine: Marland Kitchen Difficulty breathing  . Swelling of face and throat  . A fast heartbeat  . A bad rash all over body  . Dizziness and weakness   Immunizations Administered    Name Date Dose VIS Date Route   Pfizer COVID-19 Vaccine 01/24/2020 12:09 PM 0.3 mL 11/02/2019 Intramuscular   Manufacturer: [place]   Lot: [id number]   [place]: [id number]

## 2020-01-28 ENCOUNTER — Ambulatory Visit (HOSPITAL_COMMUNITY)
Admission: RE | Admit: 2020-01-28 | Discharge: 2020-01-28 | Disposition: A | Discharge status: Home / Self Care | Payer: Medicare Other | Source: Ambulatory Visit | Attending: Cardiology | Admitting: Cardiology

## 2020-01-28 ENCOUNTER — Other Ambulatory Visit: Payer: Self-pay

## 2020-01-28 DIAGNOSIS — I5022 Chronic systolic (congestive) heart failure: Secondary | ICD-10-CM | POA: Insufficient documentation

## 2020-01-28 LAB — BASIC METABOLIC PANEL
Anion gap: 7 (ref 5–15)
BUN: 8 mg/dL (ref 8–23)
CO2: 27 mmol/L (ref 22–32)
Calcium: 9 mg/dL (ref 8.9–10.3)
Chloride: 105 mmol/L (ref 98–111)
Creatinine, Ser: 0.98 mg/dL (ref 0.61–1.24)
GFR calc Af Amer: >60 mL/min (ref >60)
GFR calc non Af Amer: >60 mL/min (ref >60)
Glucose, Bld: 129 mg/dL — ABNORMAL HIGH (ref 70–99)
Potassium: 4.1 mmol/L (ref 3.5–5.1)
Sodium: 139 mmol/L (ref 135–145)

## 2020-02-04 DIAGNOSIS — H401133 Primary open-angle glaucoma, bilateral, severe stage: Secondary | ICD-10-CM | POA: Diagnosis not present

## 2020-02-15 ENCOUNTER — Telehealth: Payer: Self-pay | Admitting: Cardiovascular Disease

## 2020-02-15 NOTE — Telephone Encounter (Signed)
02/15/20 LVM RE: F/U Visit-- AF

## 2020-02-20 ENCOUNTER — Ambulatory Visit: Payer: Medicare Other | Attending: Internal Medicine

## 2020-02-20 DIAGNOSIS — Z23 Encounter for immunization: Secondary | ICD-10-CM

## 2020-02-20 NOTE — Progress Notes (Signed)
   Covid-19 Vaccination Clinic  Name:  Dylan Benton    MRN: [id number] DOB: 12-28-1940  02/20/2020  [name] was observed post Covid-19 immunization for 15 minutes without incident. He was provided with Vaccine Information Sheet and instruction to access the V-Safe system.   [name] was instructed to call 911 with any severe reactions post vaccine: Marland Kitchen Difficulty breathing  . Swelling of face and throat  . A fast heartbeat  . A bad rash all over body  . Dizziness and weakness   Immunizations Administered    Name Date Dose VIS Date Route   Pfizer COVID-19 Vaccine 02/20/2020 11:03 AM 0.3 mL 11/02/2019 Intramuscular   Manufacturer: [place]   Lot: [id number]   [place]: [id number]

## 2020-04-07 ENCOUNTER — Other Ambulatory Visit: Payer: Self-pay | Admitting: Internal Medicine

## 2020-04-07 NOTE — Telephone Encounter (Signed)
Please refill as per office routine med refill policy (all routine meds refilled for 3 mo or monthly per pt preference up to one year from last visit, then month to month grace period for 3 mo, then further med refills will have to be denied)  

## 2020-04-15 ENCOUNTER — Other Ambulatory Visit (HOSPITAL_COMMUNITY): Payer: Self-pay | Admitting: Cardiology

## 2020-04-29 ENCOUNTER — Encounter (HOSPITAL_COMMUNITY): Payer: Self-pay | Admitting: Cardiology

## 2020-04-29 ENCOUNTER — Other Ambulatory Visit: Payer: Self-pay

## 2020-04-29 ENCOUNTER — Ambulatory Visit (HOSPITAL_COMMUNITY)
Admission: RE | Admit: 2020-04-29 | Discharge: 2020-04-29 | Disposition: A | Discharge status: Home / Self Care | Payer: Medicare Other | Source: Ambulatory Visit | Attending: Cardiology | Admitting: Cardiology

## 2020-04-29 ENCOUNTER — Telehealth (HOSPITAL_COMMUNITY): Payer: Self-pay

## 2020-04-29 VITALS — BP 130/70 | HR 56 | Wt 280.0 lb

## 2020-04-29 DIAGNOSIS — I110 Hypertensive heart disease with heart failure: Secondary | ICD-10-CM | POA: Diagnosis not present

## 2020-04-29 DIAGNOSIS — I5022 Chronic systolic (congestive) heart failure: Secondary | ICD-10-CM | POA: Diagnosis not present

## 2020-04-29 DIAGNOSIS — Z79899 Other long term (current) drug therapy: Secondary | ICD-10-CM | POA: Insufficient documentation

## 2020-04-29 DIAGNOSIS — E785 Hyperlipidemia, unspecified: Secondary | ICD-10-CM | POA: Insufficient documentation

## 2020-04-29 DIAGNOSIS — I428 Other cardiomyopathies: Secondary | ICD-10-CM | POA: Insufficient documentation

## 2020-04-29 DIAGNOSIS — Z87891 Personal history of nicotine dependence: Secondary | ICD-10-CM | POA: Diagnosis not present

## 2020-04-29 DIAGNOSIS — M79662 Pain in left lower leg: Secondary | ICD-10-CM | POA: Insufficient documentation

## 2020-04-29 DIAGNOSIS — Z791 Long term (current) use of non-steroidal anti-inflammatories (NSAID): Secondary | ICD-10-CM | POA: Insufficient documentation

## 2020-04-29 DIAGNOSIS — Z7982 Long term (current) use of aspirin: Secondary | ICD-10-CM | POA: Diagnosis not present

## 2020-04-29 DIAGNOSIS — J449 Chronic obstructive pulmonary disease, unspecified: Secondary | ICD-10-CM | POA: Diagnosis not present

## 2020-04-29 DIAGNOSIS — Z7901 Long term (current) use of anticoagulants: Secondary | ICD-10-CM | POA: Insufficient documentation

## 2020-04-29 DIAGNOSIS — I2510 Atherosclerotic heart disease of native coronary artery without angina pectoris: Secondary | ICD-10-CM | POA: Diagnosis not present

## 2020-04-29 LAB — BASIC METABOLIC PANEL
Anion gap: 9 (ref 5–15)
BUN: 12 mg/dL (ref 8–23)
CO2: 22 mmol/L (ref 22–32)
Calcium: 9 mg/dL (ref 8.9–10.3)
Chloride: 107 mmol/L (ref 98–111)
Creatinine, Ser: 0.99 mg/dL (ref 0.61–1.24)
GFR calc Af Amer: >60 mL/min (ref >60)
GFR calc non Af Amer: >60 mL/min (ref >60)
Glucose, Bld: 119 mg/dL — ABNORMAL HIGH (ref 70–99)
Potassium: 6.1 mmol/L — ABNORMAL HIGH (ref 3.5–5.1)
Sodium: 138 mmol/L (ref 135–145)

## 2020-04-29 NOTE — Telephone Encounter (Signed)
Patient advised and verbalized understanding. Lab appt scheduled for 6/9,lab order entered   Orders Placed This Encounter  Procedures  . Basic Metabolic Panel (BMET)    Standing Status:   Future    Standing Expiration Date:   04/29/2021    Order Specific Question:   Release to patient    Answer:   Immediate

## 2020-04-29 NOTE — Patient Instructions (Signed)
Labs done today. We will contact you only if your labs are abnormal.  No medication changes were made. Please continue all current medications as prescribed.  Your physician recommends that you schedule a follow-up appointment in: 3 months with an echo prior to your appointment.  Your physician has requested that you have an echocardiogram. Echocardiography is a painless test that uses sound waves to create images of your heart. It provides your doctor with information about the size and shape of your heart and how well your heart's chambers and valves are working. This procedure takes approximately one hour. There are no restrictions for this procedure.  At the [facility], you and your health needs are our priority. As part of our continuing mission to provide you with exceptional heart care, we have created designated Provider Care Teams. These Care Teams include your primary Cardiologist (physician) and Advanced Practice Providers (APPs- Physician Assistants and Nurse Practitioners) who all work together to provide you with the care you need, when you need it.   You may see any of the following providers on your designated Care Team at your next follow up: Marland Kitchen Dr Glori Bickers . Dr Loralie Champagne . Darrick Grinder, NP . Lyda Jester, PA . Audry Riles, PharmD   Please be sure to bring in all your medications bottles to every appointment.

## 2020-04-29 NOTE — Progress Notes (Signed)
PCP: Dr. Jenny Reichmann Cardiology: Dr. Gwenlyn Found HF Cardiology: Dr. Aundra Dubin  79 y.o. with history of HTN and cardiomyopathy of uncertain etiology was referred by Dr. Gwenlyn Found for evaluation of dyspnea and CHF.  Patient was first found to have decreased systolic function in 0/17 when echo showed EF 45-50%. At that time, he had mild exertional dyspnea. Last summer, he had gotten to the point where he had to take frequent breaks mowing the lawn.  However, since the beginning of this year, he has had worsening dyspnea.  Echo was done in 1/19, showing EF down to 35-40%.  Cardiolite did not show a definite perfusion defect. 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 Cardiac MRI in 4/19 showed EF 46%, LGE pattern that was concerning for myocarditis. PYP scan was negative.   Echo in 7/20 showed  EF 45%, RV normal.   He returns for followup of CHF.  Weight is down 4 lbs.  No significant exertional dyspnea.  Main limitation is low back pain.  No chest pain.  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  He has sciatica-type symptoms in the left leg.  No lightheadedness.  No orthopnea/PND.   ECG (personally reviewed): Sinus bradycardia 50 bpm.   Labs (12/18): LDL 47 Labs (3/19): K 4.3, creatinine 1.09 Labs (4/19): Myeloma pattern negative Labs (5/19): K 4.6, creatinine 1.06 Labs (6/19): LDL 48, HDl 42, K 4, creatinine 1.15 Labs (6/20): LDL 51, HDl 37, K 4.4, creatinine 1.12, LFTs normal, TSH normal Labs (12/20): LDL 64, HDL 43 Labs (3/21): K 4.1, creatinine 0.98  PMH: 1. Low back pain 2. Cardiomyopathy: LHC in 2006 with normal coronaries.  Echo in 1/17 with EF 45-50%.  - Echo (1/19): EF 35-40%, mild LVH - Cardiolite (2/19): EF 41%, no ischemia/infarction.  - LHC (3/19): 50% ostial LAD, 50% ramus. LVEDP 12.  - Cardiac MRI (4/19): EF 46%, normal RV size and systolic function, LGE pattern suggestive of prior myocarditis.  - PYP scan (4/19): Negative,  not suggestive of TTR amyloidosis. - Myeloma panel negative - Echo (7/20): EF 45%, RV normal, IVC normal 3. HTN 4. Hyperlipidemia 5. CAD: LHC (3/19) with 50% ostial LAD, 50% ramus. 6. COPD: Prior smoker.  PFTs 11/19 with moderate obstruction.  7. Vasovagal syncope/presyncope.  8. PAD: peripheral arterial dopplers (3/21) showed occluded right AT artery, 30-49% left SFA.   Social History   Socioeconomic History  . Marital status: Married    Spouse name: Not on file  . Number of children: 2  . Years of education: 66  . Highest education level: Not on file  Occupational History  . Occupation: Camera operator (Part-time)  Tobacco Use  . Smoking status: Former Smoker    Quit date: 12/02/1988    Years since quitting: 31.4  . Smokeless tobacco: Never Used  Substance and Sexual Activity  . Alcohol use: No  . Drug use: No  . Sexual activity: Not on file  Other Topics Concern  . Not on file  Social History Narrative   Works for [employer] at [employer] in [place].   Social Determinants of Health   Financial Resource Strain:   . Difficulty of Paying Living Expenses:   Food Insecurity:   . Worried About Charity fundraiser in the Last Year:   . Arboriculturist in the Last Year:   Transportation Needs:   . Film/video editor (Medical):   Marland Kitchen Lack of Transportation (Non-Medical):  Physical Activity:   . Days of Exercise per Week:   . Minutes of Exercise per Session:   Stress:   . Feeling of Stress :   Social Connections:   . Frequency of Communication with Friends and Family:   . Frequency of Social Gatherings with Friends and Family:   . Attends Religious Services:   . Active Member of Clubs or Organizations:   . Attends Archivist Meetings:   Marland Kitchen Marital Status:   Intimate Partner Violence:   . Fear of Current or Ex-Partner:   . Emotionally Abused:   Marland Kitchen Physically Abused:   . Sexually Abused:    Family History  Problem Relation Age of Onset  . Coronary artery  disease Other   . Hypertension Other   . Stroke Other   . Colon cancer Neg Hx   . Esophageal cancer Neg Hx   . Rectal cancer Neg Hx   . Stomach cancer Neg Hx    ROS: All systems reviewed and negative except as per HPI.  Current Outpatient Medications  Medication Sig Dispense Refill  . albuterol (PROVENTIL HFA;VENTOLIN HFA) 108 (90 Base) MCG/ACT inhaler TAKE 2 PUFFS BY MOUTH EVERY 6 HOURS AS NEEDED FOR WHEEZE OR SHORTNESS OF BREATH 8.5 Inhaler 5  . aspirin 81 MG EC tablet TAKE 1 TABLET BY MOUTH EVERY DAY 90 tablet 3  . carvedilol (COREG) 6.25 MG tablet TAKE 1 TABLET (6.25 MG TOTAL) BY MOUTH 2 (TWO) TIMES DAILY WITH A MEAL. 180 tablet 1  . dorzolamide-timolol (COSOPT) 22.3-6.8 MG/ML ophthalmic solution Place 1 drop into both eyes 2 times daily.    Marland Kitchen ENTRESTO 97-103 MG TAKE 1 TABLET BY MOUTH TWICE A DAY 180 tablet 3  . furosemide (LASIX) 20 MG tablet Take 1 tablet (20 mg total) by mouth every other day. 30 tablet 6  . latanoprost (XALATAN) 0.005 % ophthalmic solution INSTILL 1 DROP INTO BOTH EYES EVERY DAY AT NIGHT    . meloxicam (MOBIC) 7.5 MG tablet Take 1 tablet (7.5 mg total) by mouth daily. 90 tablet 3  . rosuvastatin (CRESTOR) 10 MG tablet Take 1 tablet (10 mg total) by mouth daily. 90 tablet 0  . SPIRIVA HANDIHALER 18 MCG inhalation capsule INHALE 1 CAPSULE VIA HANDIHALER ONCE DAILY AT THE SAME TIME EVERY DAY 30 capsule 5  . spironolactone (ALDACTONE) 25 MG tablet TAKE 1 TABLET BY MOUTH EVERY DAY 90 tablet 3   No current facility-administered medications for this encounter.   BP 130/70   Pulse (!) 56   Wt 127 kg (280 lb)   SpO2 98%   BMI 35.00 kg/m  General: NAD Neck: No JVD, no thyromegaly or thyroid nodule.  Lungs: Clear to auscultation bilaterally with normal respiratory effort. CV: Nondisplaced PMI.  Heart regular S1/S2, no S3/S4, no murmur.  No peripheral edema.  No carotid bruit.  Unable to palpate pedal pulses.  Abdomen: Soft, nontender, no hepatosplenomegaly, no  distention.  Skin: Intact without lesions or rashes.  Neurologic: Alert and oriented x 3.  Psych: Normal affect. Extremities: No clubbing or cyanosis.  HEENT: Normal.    Assessment/Plan: 1. Chronic systolic CHF: Echo in 1/61 with EF 35-40%, mild LVH. LHC in 3/19 with nonobstructive CAD.  Cardiac MRI in 4/19 showed EF 46%, LGE pattern concerning for prior myocarditis. PYP scan was negative, not suggestive of TTR amyloidosis. Myeloma panel negative.  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 Echo in 7/20 showed EF remains 45%.  On exam, he is not volume  overloaded and weight is down 4 lbs.  NYHA class II symptoms, stable.  - Continue Coreg 6.25 mg bid.  With bradycardia, will not increase dose.  - Continue Entresto 97/103 mg bid. BMET today.  - Continue Lasix 20 mg every other day.   - Continue spironolactone 25 mg daily.  - Repeat echo at followup in 3 months.  2. HTN: BP controlled.  3. Hyperlipidemia.  Continue Crestor, good lipids in 12/20.  4. COPD: Moderate by PFTs.  5. Suspect OSA: Daytime sleepiness/fatigue.  - Home sleep study needs to be arranged.   6. Left calf pain: He has PAD with occluded right AT.  However, he does not seem to have significant claudication.  - Continue statin, ASA.   Followup 3 months with echo    Loralie Champagne 04/29/2020

## 2020-04-29 NOTE — Telephone Encounter (Signed)
-----   Message from Larey Dresser, MD sent at 04/29/2020  2:31 PM EDT ----- Suspect hemolysis but need repeat labs.

## 2020-04-30 ENCOUNTER — Ambulatory Visit (HOSPITAL_COMMUNITY)
Admission: RE | Admit: 2020-04-30 | Discharge: 2020-04-30 | Disposition: A | Discharge status: Home / Self Care | Payer: Medicare Other | Source: Ambulatory Visit | Attending: Cardiology | Admitting: Cardiology

## 2020-04-30 DIAGNOSIS — I5022 Chronic systolic (congestive) heart failure: Secondary | ICD-10-CM | POA: Diagnosis not present

## 2020-04-30 LAB — BASIC METABOLIC PANEL
Anion gap: 8 (ref 5–15)
BUN: 10 mg/dL (ref 8–23)
CO2: 24 mmol/L (ref 22–32)
Calcium: 9.1 mg/dL (ref 8.9–10.3)
Chloride: 107 mmol/L (ref 98–111)
Creatinine, Ser: 1.01 mg/dL (ref 0.61–1.24)
GFR calc Af Amer: >60 mL/min (ref >60)
GFR calc non Af Amer: >60 mL/min (ref >60)
Glucose, Bld: 117 mg/dL — ABNORMAL HIGH (ref 70–99)
Potassium: 4.2 mmol/L (ref 3.5–5.1)
Sodium: 139 mmol/L (ref 135–145)

## 2020-05-08 DIAGNOSIS — C61 Malignant neoplasm of prostate: Secondary | ICD-10-CM | POA: Diagnosis not present

## 2020-05-14 DIAGNOSIS — C61 Malignant neoplasm of prostate: Secondary | ICD-10-CM | POA: Diagnosis not present

## 2020-05-14 DIAGNOSIS — N3946 Mixed incontinence: Secondary | ICD-10-CM | POA: Diagnosis not present

## 2020-05-14 DIAGNOSIS — N3000 Acute cystitis without hematuria: Secondary | ICD-10-CM | POA: Diagnosis not present

## 2020-05-14 DIAGNOSIS — R9721 Rising PSA following treatment for malignant neoplasm of prostate: Secondary | ICD-10-CM | POA: Diagnosis not present

## 2020-05-15 ENCOUNTER — Other Ambulatory Visit: Payer: Self-pay

## 2020-05-15 ENCOUNTER — Encounter: Payer: Self-pay | Admitting: Internal Medicine

## 2020-05-15 ENCOUNTER — Ambulatory Visit (INDEPENDENT_AMBULATORY_CARE_PROVIDER_SITE_OTHER): Payer: Medicare Other | Admitting: Internal Medicine

## 2020-05-15 VITALS — BP 136/80 | HR 58 | Temp 98.7°F | Ht 75.0 in | Wt 278.0 lb

## 2020-05-15 DIAGNOSIS — J4520 Mild intermittent asthma, uncomplicated: Secondary | ICD-10-CM

## 2020-05-15 DIAGNOSIS — E7800 Pure hypercholesterolemia, unspecified: Secondary | ICD-10-CM

## 2020-05-15 DIAGNOSIS — R7302 Impaired glucose tolerance (oral): Secondary | ICD-10-CM

## 2020-05-15 DIAGNOSIS — E538 Deficiency of other specified B group vitamins: Secondary | ICD-10-CM | POA: Diagnosis not present

## 2020-05-15 DIAGNOSIS — E559 Vitamin D deficiency, unspecified: Secondary | ICD-10-CM

## 2020-05-15 DIAGNOSIS — I10 Essential (primary) hypertension: Secondary | ICD-10-CM | POA: Diagnosis not present

## 2020-05-15 LAB — VITAMIN B12: Vitamin B-12: 645 pg/mL (ref 211–911)

## 2020-05-15 LAB — CBC WITH DIFFERENTIAL/PLATELET
Basophils Absolute: 0 10*3/uL (ref 0.0–0.1)
Basophils Relative: 0.9 % (ref 0.0–3.0)
Eosinophils Absolute: 0.1 10*3/uL (ref 0.0–0.7)
Eosinophils Relative: 1.3 % (ref 0.0–5.0)
HCT: 41.9 % (ref 39.0–52.0)
Hemoglobin: 13.8 g/dL (ref 13.0–17.0)
Lymphocytes Relative: 34.7 % (ref 12.0–46.0)
Lymphs Abs: 1.4 10*3/uL (ref 0.7–4.0)
MCHC: 33 g/dL (ref 30.0–36.0)
MCV: 98.2 fl (ref 78.0–100.0)
Monocytes Absolute: 0.4 10*3/uL (ref 0.1–1.0)
Monocytes Relative: 9.6 % (ref 3.0–12.0)
Neutro Abs: 2.1 10*3/uL (ref 1.4–7.7)
Neutrophils Relative %: 53.5 % (ref 43.0–77.0)
Platelets: 173 10*3/uL (ref 150.0–400.0)
RBC: 4.26 Mil/uL (ref 4.22–5.81)
RDW: 12.4 % (ref 11.5–15.5)
WBC: 4 10*3/uL (ref 4.0–10.5)

## 2020-05-15 LAB — URINALYSIS, ROUTINE W REFLEX MICROSCOPIC
Bilirubin Urine: NEGATIVE
Ketones, ur: NEGATIVE
Nitrite: POSITIVE — AB
RBC / HPF: NONE SEEN (ref 0–?)
Specific Gravity, Urine: 1.015 (ref 1.000–1.030)
Total Protein, Urine: NEGATIVE
Urine Glucose: NEGATIVE
Urobilinogen, UA: 1 (ref 0.0–1.0)
pH: 6 (ref 5.0–8.0)

## 2020-05-15 LAB — TSH: TSH: 1.38 u[IU]/mL (ref 0.35–4.50)

## 2020-05-15 LAB — VITAMIN D 25 HYDROXY (VIT D DEFICIENCY, FRACTURES): VITD: 33.24 ng/mL (ref 30.00–100.00)

## 2020-05-15 LAB — HEMOGLOBIN A1C: Hgb A1c MFr Bld: 5.3 % (ref 4.6–6.5)

## 2020-05-15 NOTE — Patient Instructions (Signed)

## 2020-05-15 NOTE — Progress Notes (Signed)
Subjective:    Patient ID: Dylan Benton, male    DOB: 05/15/1941, 79 y.o.   MRN: [id number]  HPI  Here to f/u; overall doing ok,  Pt denies chest pain, increasing sob or doe, wheezing, orthopnea, PND, increased LE swelling, palpitations, dizziness or syncope.  Pt denies new neurological symptoms such as new headache, or facial or extremity weakness or numbness.  Pt denies polydipsia, polyuria, or low sugar episode.  Pt states overall good compliance with meds, mostly trying to follow appropriate diet, with wt overall stable,  but little exercise however. Seen per cardiology with able to reduce lasix after dizziness, pt not sure if better yet but no problem today.  Saw urology with f/u prostate ca at 79 yo with stable psa.  Did have mild pyuria and culture sent, not yet resulted .  Denies worsening reflux, abd pain, dysphagia, n/v, bowel change or blood, but has had some loose stools occasionally.   Pt denies fever, wt loss, night sweats, loss of appetite, or other constitutional symptoms Past Medical History:  Diagnosis Date   Allergy    Anemia, unspecified    NOS ?resolved   Anxiety    Aortic root dilation (HCC)    Arthritis    ASTHMA, UNSPECIFIED, UNSPECIFIED STATUS 08/06/2009   Annotation: No exacerbations.  Qualifier: Diagnosis of  By: Kelton Pillar MD, Madhav     Bladder calculus 05/05/2012   Cataract    Chest pain    ECHOCARDIOGRAM, ABNORMAL 12/26/2006    Aortic root dilation  This problem surfaced in 2006 after a cardiology referral for chest pain.  He was seen by Dr. Haroldine Laws in January o6 and cathed on Jan. 18.  Coronaries were nl and EF was 65%.  Echo showed mild aortic root dilation of 94mm. He was started on metoprolol (although I notice that he is no longer on this.  Annual echo to follow aortic root was advised.  Aortic root dimension was unchanged on studies in 2009 and 2010.  Will continue to follow this, perhaps not every year as it seems stable.    Erectile dysfunction  05/05/2012   GERD (gastroesophageal reflux disease) 05/05/2012   H pylori ulcer    teated H pylori   History of back surgery    Hyperlipidemia 05/08/2012   Hypertension    Impaired glucose tolerance 05/05/2012   Lumbar disc disease 05/08/2012   Male stress incontinence 05/05/2012   Myocardial infarction [facility])    pt states, "i've been told I have had two heart attacks   Obesity    OBESITY 10/06/2006   Qualifier: Diagnosis of  By: Stann Mainland MD, Nicole Kindred     Prostate cancer [facility])    Hx of    PROSTATE CANCER, HX OF 08/06/2009   Annotation: prostatectomy and radiotherapy in 1990s,  Dr. Jeffie Pollock Qualifier: Diagnosis of  By: Kelton Pillar MD, Madhav     PUD (peptic ulcer disease) 0/08/9322   Systolic CHF ([place]) 5/57/3220   Thoracic aortic aneurysm ()    UNSPECIFIED OPEN-ANGLE GLAUCOMA 08/06/2009   Annotation: Managed by opthalmologist Dr. Edilia Bo Qualifier: Diagnosis of  By: Kelton Pillar MD, Madhav     Past Surgical History:  Procedure Laterality Date   BACK SURGERY     ear surgury     eye surgury     LEFT HEART CATH AND CORONARY ANGIOGRAPHY N/A 02/06/2018   Procedure: LEFT HEART CATH AND CORONARY ANGIOGRAPHY;  Surgeon: Larey Dresser, MD;  Location: [place] [facility];  Service: Cardiovascular;  Laterality: N/A;  PROSTATECTOMY      reports that he quit smoking about 31 years ago. He has never used smokeless tobacco. He reports that he does not drink alcohol and does not use drugs. family history includes Coronary artery disease in an other family member; Hypertension in an other family member; Stroke in an other family member. No Known Allergies Current Outpatient Medications on File Prior to Visit  Medication Sig Dispense Refill   albuterol (PROVENTIL HFA;VENTOLIN HFA) 108 (90 Base) MCG/ACT inhaler TAKE 2 PUFFS BY MOUTH EVERY 6 HOURS AS NEEDED FOR WHEEZE OR SHORTNESS OF BREATH 8.5 Inhaler 5   aspirin 81 MG EC tablet TAKE 1 TABLET BY MOUTH EVERY DAY 90 tablet 3   carvedilol (COREG)  6.25 MG tablet TAKE 1 TABLET (6.25 MG TOTAL) BY MOUTH 2 (TWO) TIMES DAILY WITH A MEAL. 180 tablet 1   dorzolamide-timolol (COSOPT) 22.3-6.8 MG/ML ophthalmic solution Place 1 drop into both eyes 2 times daily.     ENTRESTO 97-103 MG TAKE 1 TABLET BY MOUTH TWICE A DAY 180 tablet 3   furosemide (LASIX) 20 MG tablet Take 1 tablet (20 mg total) by mouth every other day. 30 tablet 6   latanoprost (XALATAN) 0.005 % ophthalmic solution INSTILL 1 DROP INTO BOTH EYES EVERY DAY AT NIGHT     meloxicam (MOBIC) 7.5 MG tablet Take 1 tablet (7.5 mg total) by mouth daily. 90 tablet 3   rosuvastatin (CRESTOR) 10 MG tablet Take 1 tablet (10 mg total) by mouth daily. 90 tablet 0   SPIRIVA HANDIHALER 18 MCG inhalation capsule INHALE 1 CAPSULE VIA HANDIHALER ONCE DAILY AT THE SAME TIME EVERY DAY 30 capsule 5   spironolactone (ALDACTONE) 25 MG tablet TAKE 1 TABLET BY MOUTH EVERY DAY 90 tablet 3   No current facility-administered medications on file prior to visit.   Review of Systems All otherwise neg per pt     Objective:   Physical Exam BP 136/80 (BP Location: Left Arm, Patient Position: Sitting, Cuff Size: Large)    Pulse (!) 58    Temp 98.7 F (37.1 C) (Oral)    Ht 6\' 3"  (1.905 m)    Wt 278 lb (126.1 kg)    SpO2 97%    BMI 34.75 kg/m  VS noted,  Constitutional: Pt appears in NAD HENT: Head: NCAT.  Right Ear: External ear normal.  Left Ear: External ear normal.  Eyes: . Pupils are equal, round, and reactive to light. Conjunctivae and EOM are normal Nose: without d/c or deformity Neck: Neck supple. Gross normal ROM Cardiovascular: Normal rate and regular rhythm.   Pulmonary/Chest: Effort normal and breath sounds without rales or wheezing.  Abd:  Soft, NT, ND, + BS, no organomegaly Neurological: Pt is alert. At baseline orientation, motor grossly intact Skin: Skin is warm. No rashes, other new lesions, no LE edema Psychiatric: Pt behavior is normal without agitation  All otherwise neg per  pt Lab Results  Component Value Date   WBC 6.1 05/17/2020   HGB 13.4 05/17/2020   HCT 41.4 05/17/2020   PLT 150 05/17/2020   GLUCOSE 146 (H) 05/17/2020   CHOL 106 05/15/2020   TRIG 49.0 05/15/2020   HDL 40.80 05/15/2020   LDLCALC 55 05/15/2020   ALT 12 05/15/2020   AST 17 05/15/2020   NA 137 05/17/2020   K 4.1 05/17/2020   CL 102 05/17/2020   CREATININE 1.17 05/17/2020   BUN 9 05/17/2020   CO2 26 05/17/2020   TSH 1.38 05/15/2020   PSA  1.65 11/09/2017   INR 1.09 01/30/2018   HGBA1C 5.3 05/15/2020         Assessment & Plan:

## 2020-05-16 LAB — HEPATIC FUNCTION PANEL
ALT: 12 U/L (ref 0–53)
AST: 17 U/L (ref 0–37)
Albumin: 4.4 g/dL (ref 3.5–5.2)
Alkaline Phosphatase: 59 U/L (ref 39–117)
Bilirubin, Direct: 0.2 mg/dL (ref 0.0–0.3)
Total Bilirubin: 0.8 mg/dL (ref 0.2–1.2)
Total Protein: 6.6 g/dL (ref 6.0–8.3)

## 2020-05-16 LAB — BASIC METABOLIC PANEL
BUN: 11 mg/dL (ref 6–23)
CO2: 28 mEq/L (ref 19–32)
Calcium: 9.4 mg/dL (ref 8.4–10.5)
Chloride: 104 mEq/L (ref 96–112)
Creatinine, Ser: 1 mg/dL (ref 0.40–1.50)
GFR: 86.72 mL/min (ref >60.00)
Glucose, Bld: 99 mg/dL (ref 70–99)
Potassium: 4.6 mEq/L (ref 3.5–5.1)
Sodium: 140 mEq/L (ref 135–145)

## 2020-05-16 LAB — LIPID PANEL
Cholesterol: 106 mg/dL (ref 0–200)
HDL: 40.8 mg/dL (ref >39.00)
LDL Cholesterol: 55 mg/dL (ref 0–99)
NonHDL: 65.19
Total CHOL/HDL Ratio: 3
Triglycerides: 49 mg/dL (ref 0.0–149.0)
VLDL: 9.8 mg/dL (ref 0.0–40.0)

## 2020-05-17 ENCOUNTER — Encounter: Payer: Self-pay | Admitting: Internal Medicine

## 2020-05-17 ENCOUNTER — Encounter (HOSPITAL_COMMUNITY): Payer: Self-pay | Admitting: Emergency Medicine

## 2020-05-17 ENCOUNTER — Emergency Department (HOSPITAL_COMMUNITY)
Admission: EM | Admit: 2020-05-17 | Discharge: 2020-05-18 | Disposition: A | Discharge status: Home / Self Care | Payer: Medicare Other | Attending: Emergency Medicine | Admitting: Emergency Medicine

## 2020-05-17 ENCOUNTER — Other Ambulatory Visit: Payer: Self-pay

## 2020-05-17 DIAGNOSIS — Z87891 Personal history of nicotine dependence: Secondary | ICD-10-CM | POA: Insufficient documentation

## 2020-05-17 DIAGNOSIS — N3090 Cystitis, unspecified without hematuria: Secondary | ICD-10-CM

## 2020-05-17 DIAGNOSIS — I252 Old myocardial infarction: Secondary | ICD-10-CM | POA: Diagnosis not present

## 2020-05-17 DIAGNOSIS — I5020 Unspecified systolic (congestive) heart failure: Secondary | ICD-10-CM | POA: Insufficient documentation

## 2020-05-17 DIAGNOSIS — J45909 Unspecified asthma, uncomplicated: Secondary | ICD-10-CM | POA: Diagnosis not present

## 2020-05-17 DIAGNOSIS — Z8546 Personal history of malignant neoplasm of prostate: Secondary | ICD-10-CM | POA: Diagnosis not present

## 2020-05-17 DIAGNOSIS — I110 Hypertensive heart disease with heart failure: Secondary | ICD-10-CM | POA: Insufficient documentation

## 2020-05-17 DIAGNOSIS — Z7982 Long term (current) use of aspirin: Secondary | ICD-10-CM | POA: Diagnosis not present

## 2020-05-17 DIAGNOSIS — R509 Fever, unspecified: Secondary | ICD-10-CM | POA: Diagnosis present

## 2020-05-17 LAB — URINALYSIS, ROUTINE W REFLEX MICROSCOPIC
Bilirubin Urine: NEGATIVE
Glucose, UA: NEGATIVE mg/dL
Hgb urine dipstick: NEGATIVE
Ketones, ur: NEGATIVE mg/dL
Nitrite: POSITIVE — AB
Protein, ur: 100 mg/dL — AB
Specific Gravity, Urine: 1.019 (ref 1.005–1.030)
pH: 8 (ref 5.0–8.0)

## 2020-05-17 LAB — BASIC METABOLIC PANEL
Anion gap: 9 (ref 5–15)
BUN: 9 mg/dL (ref 8–23)
CO2: 26 mmol/L (ref 22–32)
Calcium: 8.9 mg/dL (ref 8.9–10.3)
Chloride: 102 mmol/L (ref 98–111)
Creatinine, Ser: 1.17 mg/dL (ref 0.61–1.24)
GFR calc Af Amer: >60 mL/min (ref >60)
GFR calc non Af Amer: 58 mL/min — ABNORMAL LOW (ref >60)
Glucose, Bld: 146 mg/dL — ABNORMAL HIGH (ref 70–99)
Potassium: 4.1 mmol/L (ref 3.5–5.1)
Sodium: 137 mmol/L (ref 135–145)

## 2020-05-17 LAB — CBC
HCT: 41.4 % (ref 39.0–52.0)
Hemoglobin: 13.4 g/dL (ref 13.0–17.0)
MCH: 32.2 pg (ref 26.0–34.0)
MCHC: 32.4 g/dL (ref 30.0–36.0)
MCV: 99.5 fL (ref 80.0–100.0)
Platelets: 150 10*3/uL (ref 150–400)
RBC: 4.16 MIL/uL — ABNORMAL LOW (ref 4.22–5.81)
RDW: 11.7 % (ref 11.5–15.5)
WBC: 6.1 10*3/uL (ref 4.0–10.5)
nRBC: 0 % (ref 0.0–0.2)

## 2020-05-17 NOTE — ED Triage Notes (Signed)
Pt reports he saw his urologist and PCP this week, both told him he had WBC in his urine. Reports a fever today, last dose tylenol at 1500 today. Pt denies urinary symptoms, cough/chest pain/shortness of breath.

## 2020-05-17 NOTE — Assessment & Plan Note (Signed)
stable overall by history and exam, recent data reviewed with pt, and pt to continue medical treatment as before,  to f/u any worsening symptoms or concerns  

## 2020-05-17 NOTE — Assessment & Plan Note (Addendum)

## 2020-05-18 DIAGNOSIS — N3090 Cystitis, unspecified without hematuria: Secondary | ICD-10-CM | POA: Diagnosis not present

## 2020-05-18 LAB — URINALYSIS, ROUTINE W REFLEX MICROSCOPIC
Bilirubin Urine: NEGATIVE
Glucose, UA: NEGATIVE mg/dL
Hgb urine dipstick: NEGATIVE
Ketones, ur: NEGATIVE mg/dL
Nitrite: POSITIVE — AB
Protein, ur: 30 mg/dL — AB
Specific Gravity, Urine: 1.019 (ref 1.005–1.030)
WBC, UA: >50 WBC/hpf — ABNORMAL HIGH (ref 0–5)
pH: 7 (ref 5.0–8.0)

## 2020-05-18 MED ORDER — SODIUM CHLORIDE 0.9 % IV BOLUS
500.0000 mL | Freq: Once | INTRAVENOUS | Status: AC
  Administered 2020-05-18: 500 mL via INTRAVENOUS

## 2020-05-18 MED ORDER — SODIUM CHLORIDE 0.9 % IV SOLN
1.0000 g | Freq: Once | INTRAVENOUS | Status: AC
  Administered 2020-05-18: 1 g via INTRAVENOUS
  Filled 2020-05-18: qty 10

## 2020-05-18 MED ORDER — ONDANSETRON 8 MG PO TBDP
8.0000 mg | ORAL_TABLET | Freq: Three times a day (TID) | ORAL | 0 refills | Status: DC | PRN
Start: 2020-05-18 — End: 2020-12-03

## 2020-05-18 MED ORDER — CEPHALEXIN 500 MG PO CAPS
500.0000 mg | ORAL_CAPSULE | Freq: Four times a day (QID) | ORAL | 0 refills | Status: DC
Start: 2020-05-18 — End: 2020-05-28

## 2020-05-18 NOTE — Discharge Instructions (Signed)
You have a bladder infection - please take the antibiotics prescribed.  Please return to the ER if your symptoms worsen; you have increased pain, fevers, chills, inability to keep any medications down, confusion. Otherwise see the outpatient doctor as requested.

## 2020-05-20 ENCOUNTER — Telehealth: Payer: Self-pay | Admitting: Internal Medicine

## 2020-05-20 ENCOUNTER — Other Ambulatory Visit: Payer: Self-pay | Admitting: Internal Medicine

## 2020-05-20 LAB — URINE CULTURE: Culture: >=100000 — AB

## 2020-05-20 NOTE — Telephone Encounter (Signed)
Spoke with pt and was able to speak with the pt and explain exactly what it was that caused his infection. **Pt understood and has no more questions or concerns att his time.

## 2020-05-20 NOTE — Telephone Encounter (Signed)
    Patient calling to discuss his current diagnosis given from ED, stating he does not understand the words used. Scheduler encouraged  patient to keep telephone appointment with Health Advisor on 7/1 to help questions

## 2020-05-20 NOTE — Telephone Encounter (Signed)
Please refill as per office routine med refill policy (all routine meds refilled for 3 mo or monthly per pt preference up to one year from last visit, then month to month grace period for 3 mo, then further med refills will have to be denied)  

## 2020-05-21 ENCOUNTER — Telehealth: Payer: Self-pay | Admitting: *Deleted

## 2020-05-21 NOTE — Progress Notes (Signed)
ED Antimicrobial Stewardship Positive Culture Follow Up   Dylan Benton is an 79 y.o. male who presented to [facility] on 05/17/2020 with a chief complaint of  Chief Complaint  Patient presents with  . Fever    Recent Results (from the past 720 hour(s))  Urine culture     Status: Abnormal   Collection Time: 05/18/20  3:41 AM   Specimen: Urine, Random  Result Value Ref Range Status   Specimen Description URINE, RANDOM  Final   Special Requests   Final    NONE Performed at [facility], [address] [place], [place] [address]    Culture >=100,000 COLONIES/mL PROVIDENCIA RETTGERI (A)  Final   Report Status 05/20/2020 FINAL  Final   Organism ID, Bacteria PROVIDENCIA RETTGERI (A)  Final      Susceptibility   Providencia rettgeri - MIC*    AMPICILLIN RESISTANT Resistant     CEFAZOLIN >=64 RESISTANT Resistant     CEFTRIAXONE <=0.25 SENSITIVE Sensitive     CIPROFLOXACIN <=0.25 SENSITIVE Sensitive     GENTAMICIN <=1 SENSITIVE Sensitive     IMIPENEM 1 SENSITIVE Sensitive     NITROFURANTOIN 128 RESISTANT Resistant     TRIMETH/SULFA <=20 SENSITIVE Sensitive     AMPICILLIN/SULBACTAM 16 INTERMEDIATE Intermediate     PIP/TAZO <=4 SENSITIVE Sensitive     * >=100,000 COLONIES/mL PROVIDENCIA RETTGERI    [x]  Treated with cephalexin, organism resistant to prescribed antimicrobial  New antibiotic prescription: Flow Manager to call patient. If patient experiencing urinary (dysuria, frequency, etc.) or systemic (fever, etc.) symptoms, then stop cephalexin and start Bactrim 1 DS tablet PO BID x 7 days. If patient not experiencing symptoms, then no additional  treatment indicated at this time.  ED Provider: Alfredia Client, PA-C   Margretta Sidle Dohlen 05/21/2020, 9:18 AM Clinical Pharmacist Monday - Friday phone -  [phone] Saturday - Sunday phone - [phone]

## 2020-05-21 NOTE — Telephone Encounter (Signed)
Post ED Visit - Positive Culture Follow-up: Successful Patient Follow-Up  Culture assessed and recommendations reviewed by:  []  Elenor Quinones, Pharm.D. []  Heide Guile, Pharm.D., BCPS AQ-ID []  Parks Neptune, Pharm.D., BCPS []  Alycia Rossetti, Pharm.D., BCPS []  [place], Pharm.D., BCPS, AAHIVP []  Legrand Como, Pharm.D., BCPS, AAHIVP []  Salome Arnt, PharmD, BCPS []  Johnnette Gourd, PharmD, BCPS []  Hughes Better, PharmD, BCPS []  Leeroy Cha, PharmD  Positive urine culture  []  Patient discharged without antimicrobial prescription and treatment is now indicated [x]  Organism is resistant to prescribed ED discharge antimicrobial []  Patient with positive blood cultures  Changes discussed with ED provider Alfredia Client, PA-C New antibiotic prescription bactrim DS 1 PO BID x 7 days for continued low grade temp. Stop Cephalexin.  Has appointment with PCP on 05/26/2020 Called to [facility] [phone]  Contacted patient, date 05/21/2020, time [place], [place] 05/21/2020, 1:58 PM

## 2020-05-21 NOTE — ED Provider Notes (Addendum)
[place] EMERGENCY DEPARTMENT Provider Note   CSN: [id number] Arrival date & time: 05/17/20  1549     History Chief Complaint  Patient presents with  . Fever    Dylan Benton is a 79 y.o. male.  HPI    79 year old male comes in a chief complaint of fever. Patient has history of thoracic dilation, H. pylori infection, CAD and prior history of ulcer cancer status post prostatectomy.  Patient reports that his been feeling sluggish for the last week or so.  He has seen his primary care doctor and thereafter urologist.  Although they had noted some white blood cells in his urine, he was not initiated on any antibiotics.  Urine cultures were sent but he was not informed of any results.  Today patient started developing a fever.  He reports T-max of 102.  With that he is having nausea, malaise and chills.  Chills started developing later in the evening.  Wife reports that patient was also having lower blood pressure than he normally does.  On review of system patient reports that he is not having any burning with urination or blood in the urine, but he is having urinary frequency.  He denies new cough, chest pain, shortness of breath.  Past Medical History:  Diagnosis Date  . Allergy   . Anemia, unspecified    NOS ?resolved  . Anxiety   . Aortic root dilation (HCC)   . Arthritis   . ASTHMA, UNSPECIFIED, UNSPECIFIED STATUS 08/06/2009   Annotation: No exacerbations.  Qualifier: Diagnosis of  By: Kelton Pillar MD, [place]    . Bladder calculus 05/05/2012  . Cataract   . Chest pain   . ECHOCARDIOGRAM, ABNORMAL 12/26/2006    Aortic root dilation  This problem surfaced in 2006 after a cardiology referral for chest pain.  He was seen by Dr. Haroldine Laws in January o6 and cathed on Jan. 18.  Coronaries were nl and EF was 65%.  Echo showed mild aortic root dilation of 46mm. He was started on metoprolol (although I notice that he is no longer on this.  Annual echo to follow aortic  root was advised.  Aortic root dimension was unchanged on studies in 2009 and 2010.  Will continue to follow this, perhaps not every year as it seems stable.   . Erectile dysfunction 05/05/2012  . GERD (gastroesophageal reflux disease) 05/05/2012  . H pylori ulcer    teated H pylori  . History of back surgery   . Hyperlipidemia 05/08/2012  . Hypertension   . Impaired glucose tolerance 05/05/2012  . Lumbar disc disease 05/08/2012  . Male stress incontinence 05/05/2012  . Myocardial infarction [facility])    pt states, "i've been told I have had two heart attacks  . Obesity   . OBESITY 10/06/2006   Qualifier: Diagnosis of  By: Stann Mainland MD, Nicole Kindred    . Prostate cancer ([place])    Hx of   . PROSTATE CANCER, HX OF 08/06/2009   Annotation: prostatectomy and radiotherapy in 1990s,  Dr. Jeffie Pollock Qualifier: Diagnosis of  By: Kelton Pillar MD, [place]    . PUD (peptic ulcer disease) 05/05/2012  . Systolic CHF ([place]) 6/31/4970  . Thoracic aortic aneurysm ([place])   . 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 Edilia Bo Qualifier: Diagnosis of  By: Kelton Pillar MD, Madhav      Patient Active Problem List   Diagnosis Date Noted  . External otitis of left ear 11/14/2019  . COPD (chronic obstructive  pulmonary disease) ([place]) 11/10/2018  . UTI (urinary tract infection) 05/10/2018  . Abnormal urine odor 08/23/2017  . Cough 03/03/2017  . Wheezing 03/03/2017  . Paresthesias 11/09/2016  . Trigger finger, acquired 11/09/2016  . Leg cramps 11/09/2016  . Fatigue 11/09/2016  . Left lumbar radiculopathy 11/09/2016  . Systolic CHF ([place]) 99/83/3825  . Peripheral edema 03/04/2016  . Multiple thyroid nodules 06/20/2014  . Rash and nonspecific skin eruption 05/09/2014  . Prostate cancer ([place]) 05/09/2014  . Hypersomnolence 05/08/2013  . Personal history of colonic polyps 05/08/2013  . Aortic root dilation ([place]) 05/08/2012  . Hyperlipidemia 05/08/2012  . Lumbar disc disease 05/08/2012  . Left sided  sciatica 05/08/2012  . Impaired glucose tolerance 05/05/2012  . Male stress incontinence 05/05/2012  . Erectile dysfunction 05/05/2012  . PUD (peptic ulcer disease) 05/05/2012  . GERD (gastroesophageal reflux disease) 05/05/2012  . Bladder calculus 05/05/2012  . Preventative health care 05/05/2012  . Anemia, unspecified   . Anxiety   . Arthritis of left knee   . Neurogenic pain of foot 05/03/2011  . UNSPECIFIED OPEN-ANGLE GLAUCOMA 08/06/2009  . Asthma 08/06/2009  . PAIN IN JOINT PELVIC REGION AND THIGH 08/06/2009  . ECHOCARDIOGRAM, ABNORMAL 12/26/2006  . OBESITY 10/06/2006  . Essential hypertension 10/06/2006    Past Surgical History:  Procedure Laterality Date  . BACK SURGERY    . ear surgury    . eye surgury    . LEFT HEART CATH AND CORONARY ANGIOGRAPHY N/A 02/06/2018   Procedure: LEFT HEART CATH AND CORONARY ANGIOGRAPHY;  Surgeon: Larey Dresser, MD;  Location: [place] [facility];  Service: Cardiovascular;  Laterality: N/A;  . PROSTATECTOMY         Family History  Problem Relation Age of Onset  . Coronary artery disease Other   . Hypertension Other   . Stroke Other   . Colon cancer Neg Hx   . Esophageal cancer Neg Hx   . Rectal cancer Neg Hx   . Stomach cancer Neg Hx     Social History   Tobacco Use  . Smoking status: Former Smoker    Quit date: 12/02/1988    Years since quitting: 31.4  . Smokeless tobacco: Never Used  Substance Use Topics  . Alcohol use: No  . Drug use: No    Home Medications Prior to Admission medications   Medication Sig Start Date End Date Taking? Authorizing Provider  albuterol (PROVENTIL HFA;VENTOLIN HFA) 108 (90 Base) MCG/ACT inhaler TAKE 2 PUFFS BY MOUTH EVERY 6 HOURS AS NEEDED FOR WHEEZE OR SHORTNESS OF BREATH Patient taking differently: Inhale 2 puffs into the lungs every 6 (six) hours as needed for wheezing or shortness of breath.  11/20/18  Yes Biagio Borg, MD  aspirin 81 MG EC tablet TAKE 1 TABLET BY MOUTH EVERY  DAY Patient taking differently: Take 81 mg by mouth daily.  07/16/19  Yes Larey Dresser, MD  carvedilol (COREG) 6.25 MG tablet TAKE 1 TABLET (6.25 MG TOTAL) BY MOUTH 2 (TWO) TIMES DAILY WITH A MEAL. 11/06/19  Yes Larey Dresser, MD  dorzolamide-timolol (COSOPT) 22.3-6.8 MG/ML ophthalmic solution Place 1 drop into both eyes 2 (two) times daily.  12/03/19 12/02/20 Yes [provider]  ENTRESTO 97-103 MG TAKE 1 TABLET BY MOUTH TWICE A DAY Patient taking differently: Take 1 tablet by mouth in the morning and at bedtime.  04/15/20  Yes Larey Dresser, MD  furosemide (LASIX) 20 MG tablet Take 1 tablet (20 mg total) by mouth every  other day. 01/21/20  Yes Larey Dresser, MD  latanoprost (XALATAN) 0.005 % ophthalmic solution Place 1 drop into both eyes at bedtime.  12/26/19  Yes [provider]  meloxicam (MOBIC) 7.5 MG tablet Take 1 tablet (7.5 mg total) by mouth daily. 01/16/20  Yes Biagio Borg, MD  SPIRIVA HANDIHALER 18 MCG inhalation capsule INHALE 1 CAPSULE VIA HANDIHALER ONCE DAILY AT [place] Patient taking differently: Place 18 mcg into inhaler and inhale daily.  04/07/20  Yes Biagio Borg, MD  spironolactone (ALDACTONE) 25 MG tablet TAKE 1 TABLET BY MOUTH EVERY DAY Patient taking differently: Take 25 mg by mouth daily.  05/23/19  Yes Larey Dresser, MD  cephALEXin (KEFLEX) 500 MG capsule Take 1 capsule (500 mg total) by mouth 4 (four) times daily. 05/18/20   Varney Biles, MD  ondansetron (ZOFRAN ODT) 8 MG disintegrating tablet Take 1 tablet (8 mg total) by mouth every 8 (eight) hours as needed for nausea. 05/18/20   Varney Biles, MD  rosuvastatin (CRESTOR) 10 MG tablet TAKE 1 TABLET BY MOUTH EVERY DAY 05/21/20   Biagio Borg, MD    Allergies    Patient has no known allergies.  Review of Systems   Review of Systems  Constitutional: Positive for activity change, chills, fatigue and fever.  Respiratory: Negative for cough and shortness of breath.    Cardiovascular: Negative for chest pain.  Gastrointestinal: Positive for nausea. Negative for vomiting.  Genitourinary: Positive for frequency.  Allergic/Immunologic: Negative for immunocompromised state.  All other systems reviewed and are negative.   Physical Exam Updated Vital Signs BP (!) 117/50   Pulse 61   Temp 98.2 F (36.8 C) (Oral)   Resp 17   Ht 6\' 3"  (1.905 m)   Wt 124.7 kg   SpO2 98%   BMI 34.37 kg/m   Physical Exam Vitals and nursing note reviewed.  Constitutional:      Appearance: He is well-developed.  HENT:     Head: Atraumatic.  Cardiovascular:     Rate and Rhythm: Normal rate.  Pulmonary:     Effort: Pulmonary effort is normal.  Abdominal:     Tenderness: There is no abdominal tenderness.     Comments: Lower quadrant tenderness w/o rebound  Musculoskeletal:     Cervical back: Neck supple.  Skin:    General: Skin is warm.  Neurological:     Mental Status: He is alert and oriented to person, place, and time.     ED Results / Procedures / Treatments   Labs (all labs ordered are listed, but only abnormal results are displayed) Labs Reviewed  URINE CULTURE - Abnormal; Notable for the following components:      Result Value   Culture >=100,000 COLONIES/mL PROVIDENCIA RETTGERI (*)    Organism ID, Bacteria PROVIDENCIA RETTGERI (*)    All other components within normal limits  URINALYSIS, ROUTINE W REFLEX MICROSCOPIC - Abnormal; Notable for the following components:   APPearance HAZY (*)    Protein, ur 100 (*)    Nitrite POSITIVE (*)    Leukocytes,Ua MODERATE (*)    Bacteria, UA MANY (*)    All other components within normal limits  BASIC METABOLIC PANEL - Abnormal; Notable for the following components:   Glucose, Bld 146 (*)    GFR calc non Af Amer 58 (*)    All other components within normal limits  CBC - Abnormal; Notable for the following components:   RBC 4.16 (*)  All other components within normal limits  URINALYSIS, ROUTINE W  REFLEX MICROSCOPIC - Abnormal; Notable for the following components:   Color, Urine AMBER (*)    APPearance HAZY (*)    Protein, ur 30 (*)    Nitrite POSITIVE (*)    Leukocytes,Ua MODERATE (*)    WBC, UA >50 (*)    Bacteria, UA FEW (*)    All other components within normal limits    EKG None  Radiology No results found.  Procedures Procedures (including critical care time)  Medications Ordered in ED Medications  cefTRIAXone (ROCEPHIN) 1 g in sodium chloride 0.9 % 100 mL IVPB (0 g Intravenous Stopped 05/18/20 0604)  sodium chloride 0.9 % bolus 500 mL (0 mLs Intravenous Stopped 05/18/20 0604)    ED Course  I have reviewed the triage vital signs and the nursing notes.  Pertinent labs & imaging results that were available during my care of the patient were reviewed by me and considered in my medical decision making (see chart for details).    MDM Rules/Calculators/A&P                          79 year old male comes in a chief complaint of fevers and chills.  He has been having some malaise and increased urinary frequency.  Focal concerns are high for UTI.  Patient does not have clear evidence of pyelonephritis besides a systemic symptoms right now.  He has history of prostatectomy therefore we do not think he has prostatitis.  Patient's lab results are overall reassuring.  He is also hemodynamically stable besides having a fever.  Blood pressure appears to be on the lower end of normal for him.  Clinically does not appear to be septic.  Patient has been given IV antibiotics in the ER.  Urinalysis shows clear signs of infection.  Cultures have been sent to ensure that the oral antibiotics that he will be given will cover his infection.  The patient appears reasonably screened and/or stabilized for discharge and I doubt any other medical condition or other [facility] requiring further screening, evaluation, or treatment in the ED at this time prior to discharge.   Results from the ER  workup discussed with the patient face to face and all questions answered to the best of my ability. The patient is safe for discharge with strict return precautions.   Final Clinical Impression(s) / ED Diagnoses Final diagnoses:  Cystitis    Rx / DC Orders ED Discharge Orders         Ordered    cephALEXin (KEFLEX) 500 MG capsule  4 times daily     Discontinue  Reprint     05/18/20 0713    ondansetron (ZOFRAN ODT) 8 MG disintegrating tablet  Every 8 hours PRN     Discontinue  Reprint     05/18/20 [place], Tahari Clabaugh, MD 05/21/20 [place], Shivangi Lutz, MD 06/09/20 1045

## 2020-05-22 ENCOUNTER — Other Ambulatory Visit (HOSPITAL_COMMUNITY): Payer: Self-pay | Admitting: Cardiology

## 2020-05-22 ENCOUNTER — Ambulatory Visit (INDEPENDENT_AMBULATORY_CARE_PROVIDER_SITE_OTHER): Payer: Medicare Other

## 2020-05-22 DIAGNOSIS — Z0000 Encounter for general adult medical examination without abnormal findings: Secondary | ICD-10-CM | POA: Diagnosis not present

## 2020-05-22 NOTE — Progress Notes (Signed)
I connected with [name] today by telephone and verified that I am speaking with the correct person using two identifiers. Location patient: home Location provider: work Persons participating in the virtual visit: Livio Ledwith and Jule Ser. Johnny Gorter, LPN  I discussed the limitations, risks, security and privacy concerns of performing an evaluation and management service by telephone and the availability of in person appointments. I also discussed with the patient that there may be a patient responsible charge related to this service. The patient expressed understanding and verbally consented to this telephonic visit.    Interactive audio and video telecommunications were attempted between this provider and patient, however failed, due to patient having technical difficulties OR patient did not have access to video capability.  We continued and completed visit with audio only.  Some vital signs may be absent or patient reported.   Time Spent with patient on telephone encounter: 20 minutes   Subjective:   Dylan Benton is a 79 y.o. male who presents for Medicare Annual/Subsequent preventive examination.  Review of Systems    No ROS. Medicare Wellness Virtual Visit. Additional risk factors are reflected in social history. Cardiac Risk Factors include: advanced age (>34men, >29 women);dyslipidemia;family history of premature cardiovascular disease;hypertension;male gender;obesity (BMI >30kg/m2)     Objective:    Today's Vitals   05/22/20 0916  PainSc: 0-No pain   There is no height or weight on file to calculate BMI.  Advanced Directives 05/22/2020 09/26/2018 02/06/2018 03/12/2017 09/17/2014  Does Patient Have a Medical Advance Directive? Yes No No No No  Does patient want to make changes to medical advance directive? No - Patient declined - - - -  Would patient like information on creating a medical advance directive? - No - Patient declined No - Patient declined No -  Patient declined No - patient declined information    Current Medications (verified) Outpatient Encounter Medications as of 05/22/2020  Medication Sig  . albuterol (PROVENTIL HFA;VENTOLIN HFA) 108 (90 Base) MCG/ACT inhaler TAKE 2 PUFFS BY MOUTH EVERY 6 HOURS AS NEEDED FOR WHEEZE OR SHORTNESS OF BREATH (Patient taking differently: Inhale 2 puffs into the lungs every 6 (six) hours as needed for wheezing or shortness of breath. )  . aspirin 81 MG EC tablet TAKE 1 TABLET BY MOUTH EVERY DAY (Patient taking differently: Take 81 mg by mouth daily. )  . carvedilol (COREG) 6.25 MG tablet TAKE 1 TABLET (6.25 MG TOTAL) BY MOUTH 2 (TWO) TIMES DAILY WITH A MEAL.  . cephALEXin (KEFLEX) 500 MG capsule Take 1 capsule (500 mg total) by mouth 4 (four) times daily.  . dorzolamide-timolol (COSOPT) 22.3-6.8 MG/ML ophthalmic solution Place 1 drop into both eyes 2 (two) times daily.   Marland Kitchen ENTRESTO 97-103 MG TAKE 1 TABLET BY MOUTH TWICE A DAY (Patient taking differently: Take 1 tablet by mouth in the morning and at bedtime. )  . furosemide (LASIX) 20 MG tablet Take 1 tablet (20 mg total) by mouth every other day.  . latanoprost (XALATAN) 0.005 % ophthalmic solution Place 1 drop into both eyes at bedtime.   . meloxicam (MOBIC) 7.5 MG tablet Take 1 tablet (7.5 mg total) by mouth daily.  . ondansetron (ZOFRAN ODT) 8 MG disintegrating tablet Take 1 tablet (8 mg total) by mouth every 8 (eight) hours as needed for nausea.  . rosuvastatin (CRESTOR) 10 MG tablet TAKE 1 TABLET BY MOUTH EVERY DAY  . SPIRIVA HANDIHALER 18 MCG inhalation capsule INHALE 1 CAPSULE VIA HANDIHALER  ONCE DAILY AT THE SAME TIME EVERY DAY (Patient taking differently: Place 18 mcg into inhaler and inhale daily. )  . spironolactone (ALDACTONE) 25 MG tablet TAKE 1 TABLET BY MOUTH EVERY DAY (Patient taking differently: Take 25 mg by mouth daily. )   No facility-administered encounter medications on file as of 05/22/2020.    Allergies (verified) Patient has no  known allergies.   History: Past Medical History:  Diagnosis Date  . Allergy   . Anemia, unspecified    NOS ?resolved  . Anxiety   . Aortic root dilation (HCC)   . Arthritis   . ASTHMA, UNSPECIFIED, UNSPECIFIED STATUS 08/06/2009   Annotation: No exacerbations.  Qualifier: Diagnosis of  By: Kelton Pillar MD, [place]    . Bladder calculus 05/05/2012  . Cataract   . Chest pain   . ECHOCARDIOGRAM, ABNORMAL 12/26/2006    Aortic root dilation  This problem surfaced in 2006 after a cardiology referral for chest pain.  He was seen by Dr. Haroldine Laws in January o6 and cathed on Jan. 18.  Coronaries were nl and EF was 65%.  Echo showed mild aortic root dilation of 37mm. He was started on metoprolol (although I notice that he is no longer on this.  Annual echo to follow aortic root was advised.  Aortic root dimension was unchanged on studies in 2009 and 2010.  Will continue to follow this, perhaps not every year as it seems stable.   . Erectile dysfunction 05/05/2012  . GERD (gastroesophageal reflux disease) 05/05/2012  . H pylori ulcer    teated H pylori  . History of back surgery   . Hyperlipidemia 05/08/2012  . Hypertension   . Impaired glucose tolerance 05/05/2012  . Lumbar disc disease 05/08/2012  . Male stress incontinence 05/05/2012  . Myocardial infarction [facility])    pt states, "i've been told I have had two heart attacks  . Obesity   . OBESITY 10/06/2006   Qualifier: Diagnosis of  By: Stann Mainland MD, Nicole Kindred    . Prostate cancer ([place])    Hx of   . PROSTATE CANCER, HX OF 08/06/2009   Annotation: prostatectomy and radiotherapy in 1990s,  Dr. Jeffie Pollock Qualifier: Diagnosis of  By: Kelton Pillar MD, [place]    . PUD (peptic ulcer disease) 05/05/2012  . Systolic CHF ([place]) 0/94/7096  . Thoracic aortic aneurysm ([place])   . 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 Edilia Bo Qualifier: Diagnosis of  By: Kelton Pillar MD, Mad[place]     Past Surgical History:  Procedure Laterality Date  . BACK  SURGERY    . ear surgury    . eye surgury    . LEFT HEART CATH AND CORONARY ANGIOGRAPHY N/A 02/06/2018   Procedure: LEFT HEART CATH AND CORONARY ANGIOGRAPHY;  Surgeon: Larey Dresser, MD;  Location: [facility];  Service: Cardiovascular;  Laterality: N/A;  . PROSTATECTOMY     Family History  Problem Relation Age of Onset  . Coronary artery disease Other   . Hypertension Other   . Stroke Other   . Colon cancer Neg Hx   . Esophageal cancer Neg Hx   . Rectal cancer Neg Hx   . Stomach cancer Neg Hx    Social History   Socioeconomic History  . Marital status: Married    Spouse name: Not on file  . Number of children: 2  . Years of education: 60  . Highest education level: Not on file  Occupational History  . Occupation: Camera operator (Part-time)  Tobacco Use  . Smoking status: Former Smoker    Quit date: 12/02/1988    Years since quitting: 31.4  . Smokeless tobacco: Never Used  Substance and Sexual Activity  . Alcohol use: No  . Drug use: No  . Sexual activity: Not on file  Other Topics Concern  . Not on file  Social History Narrative   Works for [employer] at [employer] in [place].   Social Determinants of Health   Financial Resource Strain: Low Risk   . Difficulty of Paying Living Expenses: Not hard at all  Food Insecurity: No Food Insecurity  . Worried About Charity fundraiser in the Last Year: Never true  . Ran Out of Food in the Last Year: Never true  Transportation Needs: No Transportation Needs  . Lack of Transportation (Medical): No  . Lack of Transportation (Non-Medical): No  Physical Activity: Insufficiently Active  . Days of Exercise per Week: 3 days  . Minutes of Exercise per Session: 30 min  Stress: No Stress Concern Present  . Feeling of Stress : Not at all  Social Connections: Moderately Isolated  . Frequency of Communication with Friends and Family: More than three times a week  . Frequency of Social Gatherings with Friends and Family: More  than three times a week  . Attends Religious Services: Never  . Active Member of Clubs or Organizations: No  . Attends Archivist Meetings: Never  . Marital Status: Married    Tobacco Counseling Counseling given: No   Clinical Intake:  Pre-visit preparation completed: Yes  Pain : No/denies pain Pain Score: 0-No pain     Nutritional Risks: None Diabetes: No  How often do you need to have someone help you when you read instructions, pamphlets, or other written materials from your doctor or pharmacy?: 1 - Never What is the last grade level you completed in school?: Some college; Librarian, academic for 9 years)  Diabetic? no  Interpreter Needed?: No  Information entered by :: [employer]. Avriana Joo, LPN   Activities of Daily Living In your present state of health, do you have any difficulty performing the following activities: 05/22/2020  Hearing? N  Vision? Y  Comment dx with glaucoma  Difficulty concentrating or making decisions? N  Walking or climbing stairs? N  Dressing or bathing? N  Doing errands, shopping? N  Preparing Food and eating ? N  Using the Toilet? N  In the past six months, have you accidently leaked urine? Y  Comment wears Depends for protection  Do you have problems with loss of bowel control? N  Managing your Medications? N  Managing your Finances? N  Housekeeping or managing your Housekeeping? N  Some recent data might be hidden    Patient Care Team: Biagio Borg, MD as PCP - General (Internal Medicine) Lorretta Harp, MD as PCP - Cardiology (Cardiology)  Indicate any recent Medical Services you may have received from other than [facility] providers in the past year (date may be approximate).     Assessment:   This is a routine wellness examination for Aramis.  Hearing/Vision screen No exam data present  Dietary issues and exercise activities discussed: Current Exercise Habits: Home exercise routine, Type of exercise:  stretching;walking;Other - see comments (stationary bike), Time (Minutes): 30, Frequency (Times/Week): 4, Weekly Exercise (Minutes/Week): 120, Intensity: Moderate, Exercise limited by: respiratory conditions(s);orthopedic condition(s);neurologic condition(s);cardiac condition(s)  Goals    .  Client understands the importance of follow-up with providers by attending scheduled  visits (pt-stated)      To stay alive and to gain strength back.      Depression Screen PHQ 2/9 Scores 05/22/2020 05/15/2020 11/14/2019 05/16/2019 05/10/2018 11/09/2017 05/10/2017  PHQ - 2 Score 0 0 0 0 0 0 1  PHQ- 9 Score - - - - - - 4    Fall Risk Fall Risk  05/22/2020 05/15/2020 11/14/2019 11/14/2019 05/16/2019  Falls in the past year? 1 1 1 1  0  Number falls in past yr: 0 0 0 1 0  Injury with Fall? 0 0 0 0 0  Comment - - - - -  Risk for fall due to : Impaired balance/gait - - - -  Follow up Falls evaluation completed - - - -    Any stairs in or around the home? No  If so, are there any without handrails? No  Home free of loose throw rugs in walkways, pet beds, electrical cords, etc? Yes  Adequate lighting in your home to reduce risk of falls? Yes   ASSISTIVE DEVICES UTILIZED TO PREVENT FALLS:  Life alert? No  Use of a cane, walker or w/c? Yes  Grab bars in the bathroom? No  Shower chair or bench in shower? No  Elevated toilet seat or a handicapped toilet? No   TIMED UP AND GO:  Was the test performed? No .  Length of time to ambulate 10 feet: 0 sec.   Gait steady and fast with assistive device (per patient; no issues with gait)  Cognitive Function: not indicated; patient is cogitatively intact.        Immunizations Immunization History  Administered Date(s) Administered  . PFIZER SARS-COV-2 Vaccination 01/24/2020, 02/20/2020  . Pneumococcal Conjugate-13 11/07/2014  . Pneumococcal Polysaccharide-23 11/09/2017  . Tdap 04/22/1976    TDAP status: Up to date Flu Vaccine status: Declined, Education  has been provided regarding the importance of this vaccine but patient still declined. Advised may receive this vaccine at local pharmacy or Health Dept. Aware to provide a copy of the vaccination record if obtained from local pharmacy or Health Dept. Verbalized acceptance and understanding. Pneumococcal vaccine status: Up to date Covid-19 vaccine status: Completed vaccines  Qualifies for Shingles Vaccine? Yes   Zostavax completed No   Shingrix Completed?: No.    Education has been provided regarding the importance of this vaccine. Patient has been advised to call insurance company to determine out of pocket expense if they have not yet received this vaccine. Advised may also receive vaccine at local pharmacy or Health Dept. Verbalized acceptance and understanding.  Screening Tests Health Maintenance  Topic Date Due  . COLONOSCOPY  11/13/2020 (Originally 09/21/2018)  . INFLUENZA VACCINE  06/22/2020  . TETANUS/TDAP  09/22/2023  . COVID-19 Vaccine  Completed  . PNA vac Low Risk Adult  Completed    Health Maintenance  There are no preventive care reminders to display for this patient.  Colorectal cancer screening: Completed 09/21/2013. Repeat every 5 years  Lung Cancer Screening: (Low Dose CT Chest recommended if Age 16-80 years, 30 pack-year currently smoking OR have quit w/in 15years.) does not qualify.   Lung Cancer Screening Referral: no  Additional Screening:  Hepatitis C Screening: does not qualify; Completed no  Vision Screening: Recommended annual ophthalmology exams for early detection of glaucoma and other disorders of the eye. Is the patient up to date with their annual eye exam?  Yes  Who is the provider or what is the name of the office in which the patient  attends annual eye exams? [facility] If pt is not established with a provider, would they like to be referred to a provider to establish care? No .   Dental Screening: Recommended annual  dental exams for proper oral hygiene  Community Resource Referral / Chronic Care Management: CRR required this visit?  No   CCM required this visit?  No      Plan:     I have personally reviewed and noted the following in the patient's chart:   . Medical and social history . Use of alcohol, tobacco or illicit drugs  . Current medications and supplements . Functional ability and status . Nutritional status . Physical activity . Advanced directives . List of other physicians . Hospitalizations, surgeries, and ER visits in previous 12 months . Vitals . Screenings to include cognitive, depression, and falls . Referrals and appointments  In addition, I have reviewed and discussed with patient certain preventive protocols, quality metrics, and best practice recommendations. A written personalized care plan for preventive services as well as general preventive health recommendations were provided to patient.     Sheral Flow, LPN   12/26/4008   Nurse Notes:  There were no vitals filed for this visit. There is no height or weight on file to calculate BMI.

## 2020-05-22 NOTE — Patient Instructions (Signed)
Dylan Benton , Thank you for taking time to come for your Medicare Wellness Visit. I appreciate your ongoing commitment to your health goals. Please review the following plan we discussed and let me know if I can assist you in the future.   Screening recommendations/referrals: Colonoscopy: 09/21/2013; due every 5 years (has decided to repeat in 2021) Recommended yearly ophthalmology/optometry visit for glaucoma screening and checkup Recommended yearly dental visit for hygiene and checkup  Vaccinations: Influenza vaccine: never done Pneumococcal vaccine: completed Tdap vaccine: 09/21/2013; due every 10 years Shingles vaccine: never done   Covid-19: completed  Advanced directives: Please bring a copy of your health care power of attorney and living will to the office at your convenience.  Conditions/risks identified: Please continue to do your personal lifestyle choices by: daily care of teeth and gums, regular physical activity (goal should be 5 days a week for 30 minutes), eat a healthy diet, avoid tobacco and drug use, limiting any alcohol intake, taking a low-dose aspirin (if not allergic or have been advised by your provider otherwise) and taking vitamins and minerals as recommended by your provider. Continue doing brain stimulating activities (puzzles, reading, adult coloring books, staying active) to keep memory sharp. Continue to eat heart healthy diet (full of fruits, vegetables, whole grains, lean protein, water--limit salt, fat, and sugar intake) and increase physical activity as tolerated.  Next appointment: Please schedule your next Medicare Wellness Visit with your Nurse Health Advisor in 1 year.  Preventive Care 81 Years and Older, Male Preventive care refers to lifestyle choices and visits with your health care provider that can promote health and wellness. What does preventive care include?  A yearly physical exam. This is also called an annual well check.  Dental exams once  or twice a year.  Routine eye exams. Ask your health care provider how often you should have your eyes checked.  Personal lifestyle choices, including:  Daily care of your teeth and gums.  Regular physical activity.  Eating a healthy diet.  Avoiding tobacco and drug use.  Limiting alcohol use.  Practicing safe sex.  Taking low doses of aspirin every day.  Taking vitamin and mineral supplements as recommended by your health care provider. What happens during an annual well check? The services and screenings done by your health care provider during your annual well check will depend on your age, overall health, lifestyle risk factors, and family history of disease. Counseling  Your health care provider may ask you questions about your:  Alcohol use.  Tobacco use.  Drug use.  Emotional well-being.  Home and relationship well-being.  Sexual activity.  Eating habits.  History of falls.  Memory and ability to understand (cognition).  Work and work Statistician. Screening  You may have the following tests or measurements:  Height, weight, and BMI.  Blood pressure.  Lipid and cholesterol levels. These may be checked every 5 years, or more frequently if you are over 81 years old.  Skin check.  Lung cancer screening. You may have this screening every year starting at age 81 if you have a 30-pack-year history of smoking and currently smoke or have quit within the past 15 years.  Fecal occult blood test (FOBT) of the stool. You may have this test every year starting at age 81.  Flexible sigmoidoscopy or colonoscopy. You may have a sigmoidoscopy every 5 years or a colonoscopy every 10 years starting at age 5.  Prostate cancer screening. Recommendations will vary depending on your family history  and other risks.  Hepatitis C blood test.  Hepatitis B blood test.  Sexually transmitted disease (STD) testing.  Diabetes screening. This is done by checking your blood  sugar (glucose) after you have not eaten for a while (fasting). You may have this done every 1-3 years.  Abdominal aortic aneurysm (AAA) screening. You may need this if you are a current or former smoker.  Osteoporosis. You may be screened starting at age 81 if you are at high risk. Talk with your health care provider about your test results, treatment options, and if necessary, the need for more tests. Vaccines  Your health care provider may recommend certain vaccines, such as:  Influenza vaccine. This is recommended every year.  Tetanus, diphtheria, and acellular pertussis (Tdap, Td) vaccine. You may need a Td booster every 10 years.  Zoster vaccine. You may need this after age 81  Pneumococcal 13-valent conjugate (PCV13) vaccine. One dose is recommended after age 81  Pneumococcal polysaccharide (PPSV23) vaccine. One dose is recommended after age 81 Talk to your health care provider about which screenings and vaccines you need and how often you need them. This information is not intended to replace advice given to you by your health care provider. Make sure you discuss any questions you have with your health care provider. Document Released: 12/05/2015 Document Revised: 07/28/2016 Document Reviewed: 09/09/2015 Elsevier Interactive Patient Education  2017 [place] Prevention in the Home Falls can cause injuries. They can happen to people of all ages. There are many things you can do to make your home safe and to help prevent falls. What can I do on the outside of my home?  Regularly fix the edges of walkways and driveways and fix any cracks.  Remove anything that might make you trip as you walk through a door, such as a raised step or threshold.  Trim any bushes or trees on the path to your home.  Use bright outdoor lighting.  Clear any walking paths of anything that might make someone trip, such as rocks or tools.  Regularly check to see if handrails are loose or  broken. Make sure that both sides of any steps have handrails.  Any raised decks and porches should have guardrails on the edges.  Have any leaves, snow, or ice cleared regularly.  Use sand or salt on walking paths during winter.  Clean up any spills in your garage right away. This includes oil or grease spills. What can I do in the bathroom?  Use night lights.  Install grab bars by the toilet and in the tub and shower. Do not use towel bars as grab bars.  Use non-skid mats or decals in the tub or shower.  If you need to sit down in the shower, use a plastic, non-slip stool.  Keep the floor dry. Clean up any water that spills on the floor as soon as it happens.  Remove soap buildup in the tub or shower regularly.  Attach bath mats securely with double-sided non-slip rug tape.  Do not have throw rugs and other things on the floor that can make you trip. What can I do in the bedroom?  Use night lights.  Make sure that you have a light by your bed that is easy to reach.  Do not use any sheets or blankets that are too big for your bed. They should not hang down onto the floor.  Have a firm chair that has side arms. You can use this  for support while you get dressed.  Do not have throw rugs and other things on the floor that can make you trip. What can I do in the kitchen?  Clean up any spills right away.  Avoid walking on wet floors.  Keep items that you use a lot in easy-to-reach places.  If you need to reach something above you, use a strong step stool that has a grab bar.  Keep electrical cords out of the way.  Do not use floor polish or wax that makes floors slippery. If you must use wax, use non-skid floor wax.  Do not have throw rugs and other things on the floor that can make you trip. What can I do with my stairs?  Do not leave any items on the stairs.  Make sure that there are handrails on both sides of the stairs and use them. Fix handrails that are  broken or loose. Make sure that handrails are as long as the stairways.  Check any carpeting to make sure that it is firmly attached to the stairs. Fix any carpet that is loose or worn.  Avoid having throw rugs at the top or bottom of the stairs. If you do have throw rugs, attach them to the floor with carpet tape.  Make sure that you have a light switch at the top of the stairs and the bottom of the stairs. If you do not have them, ask someone to add them for you. What else can I do to help prevent falls?  Wear shoes that:  Do not have high heels.  Have rubber bottoms.  Are comfortable and fit you well.  Are closed at the toe. Do not wear sandals.  If you use a stepladder:  Make sure that it is fully opened. Do not climb a closed stepladder.  Make sure that both sides of the stepladder are locked into place.  Ask someone to hold it for you, if possible.  Clearly mark and make sure that you can see:  Any grab bars or handrails.  First and last steps.  Where the edge of each step is.  Use tools that help you move around (mobility aids) if they are needed. These include:  Canes.  Walkers.  Scooters.  Crutches.  Turn on the lights when you go into a dark area. Replace any light bulbs as soon as they burn out.  Set up your furniture so you have a clear path. Avoid moving your furniture around.  If any of your floors are uneven, fix them.  If there are any pets around you, be aware of where they are.  Review your medicines with your doctor. Some medicines can make you feel dizzy. This can increase your chance of falling. Ask your doctor what other things that you can do to help prevent falls. This information is not intended to replace advice given to you by your health care provider. Make sure you discuss any questions you have with your health care provider. Document Released: 09/04/2009 Document Revised: 04/15/2016 Document Reviewed: 12/13/2014 Elsevier  Interactive Patient Education  2017 [employer].

## 2020-05-28 ENCOUNTER — Encounter: Payer: Self-pay | Admitting: Internal Medicine

## 2020-05-28 ENCOUNTER — Other Ambulatory Visit: Payer: Self-pay

## 2020-05-28 ENCOUNTER — Ambulatory Visit (INDEPENDENT_AMBULATORY_CARE_PROVIDER_SITE_OTHER): Payer: Medicare Other | Admitting: Internal Medicine

## 2020-05-28 VITALS — BP 130/60 | HR 57 | Temp 99.1°F | Ht 75.0 in | Wt 275.0 lb

## 2020-05-28 DIAGNOSIS — N390 Urinary tract infection, site not specified: Secondary | ICD-10-CM

## 2020-05-28 DIAGNOSIS — J439 Emphysema, unspecified: Secondary | ICD-10-CM | POA: Diagnosis not present

## 2020-05-28 DIAGNOSIS — R7302 Impaired glucose tolerance (oral): Secondary | ICD-10-CM

## 2020-05-28 DIAGNOSIS — I10 Essential (primary) hypertension: Secondary | ICD-10-CM | POA: Diagnosis not present

## 2020-05-28 MED ORDER — CIPROFLOXACIN HCL 500 MG PO TABS
500.0000 mg | ORAL_TABLET | Freq: Two times a day (BID) | ORAL | 0 refills | Status: AC
Start: 2020-05-28 — End: 2020-06-07

## 2020-05-28 MED ORDER — CEFTRIAXONE SODIUM 1 G IJ SOLR
1.0000 g | Freq: Once | INTRAMUSCULAR | Status: AC
  Administered 2020-05-28: 1 g via INTRAMUSCULAR

## 2020-05-28 NOTE — Patient Instructions (Signed)
You had the ANTIBIOTIC shot today (rocephin)  Please take all new medication as prescribed - the pill antibiotic  Please continue all other medications as before, and refills have been done if requested.  Please have the pharmacy call with any other refills you may need.  Please keep your appointments with your specialists as you may have planned  Please go to the LAB at the blood drawing area for the tests to be done - just the urine testing today  You will be contacted by phone if any changes need to be made immediately.  Otherwise, you will receive a letter about your results with an explanation, but please check with MyChart first.  Please remember to sign up for MyChart if you have not done so, as this will be important to you in the future with finding out test results, communicating by private email, and scheduling acute appointments online when needed.

## 2020-05-28 NOTE — Assessment & Plan Note (Signed)
stable overall by history and exam, recent data reviewed with pt, and pt to continue medical treatment as before,  to f/u any worsening symptoms or concerns  

## 2020-05-28 NOTE — Progress Notes (Signed)
Subjective:    Patient ID: Dylan Benton, male    DOB: 04-08-1941, 79 y.o.   MRN: [id number]  HPI  Here to f/u recent uti culture proven > 100k providencia, initially tx with cephalexin but resistant so change June 30 to septra course, did well while taking and finished, now last 2 days with recurrent symptoms fever, pain and urinary frequency, o/w Denies urinary symptoms such as urgency, flank pain, hematuria or n/v.  Had temp at home 101 with a few chills but does not appear toxic.  Pt denies chest pain, increased sob or doe, wheezing, orthopnea, PND, increased LE swelling, palpitations, dizziness or syncope.  Pt denies new neurological symptoms such as new headache, or facial or extremity weakness or numbness   Pt denies polydipsia, polyuria Past Medical History:  Diagnosis Date  . Allergy   . Anemia, unspecified    NOS ?resolved  . Anxiety   . Aortic root dilation (HCC)   . Arthritis   . ASTHMA, UNSPECIFIED, UNSPECIFIED STATUS 08/06/2009   Annotation: No exacerbations.  Qualifier: Diagnosis of  By: Kelton Pillar MD, [place]    . Bladder calculus 05/05/2012  . Cataract   . Chest pain   . ECHOCARDIOGRAM, ABNORMAL 12/26/2006    Aortic root dilation  This problem surfaced in 2006 after a cardiology referral for chest pain.  He was seen by Dr. Haroldine Laws in January o6 and cathed on Jan. 18.  Coronaries were nl and EF was 65%.  Echo showed mild aortic root dilation of 65mm. He was started on metoprolol (although I notice that he is no longer on this.  Annual echo to follow aortic root was advised.  Aortic root dimension was unchanged on studies in 2009 and 2010.  Will continue to follow this, perhaps not every year as it seems stable.   . Erectile dysfunction 05/05/2012  . GERD (gastroesophageal reflux disease) 05/05/2012  . H pylori ulcer    teated H pylori  . History of back surgery   . Hyperlipidemia 05/08/2012  . Hypertension   . Impaired glucose tolerance 05/05/2012  . Lumbar disc disease 05/08/2012   . Male stress incontinence 05/05/2012  . Myocardial infarction [facility])    pt states, "i've been told I have had two heart attacks  . Obesity   . OBESITY 10/06/2006   Qualifier: Diagnosis of  By: Stann Mainland MD, Nicole Kindred    . Prostate cancer ([place])    Hx of   . PROSTATE CANCER, HX OF 08/06/2009   Annotation: prostatectomy and radiotherapy in 1990s,  Dr. Jeffie Pollock Qualifier: Diagnosis of  By: Kelton Pillar MD, [place]    . PUD (peptic ulcer disease) 05/05/2012  . Systolic CHF ([place]) 0/35/4656  . Thoracic aortic aneurysm ([place])   . 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 Edilia Bo Qualifier: Diagnosis of  By: Kelton Pillar MD, [place]     Past Surgical History:  Procedure Laterality Date  . BACK SURGERY    . ear surgury    . eye surgury    . LEFT HEART CATH AND CORONARY ANGIOGRAPHY N/A 02/06/2018   Procedure: LEFT HEART CATH AND CORONARY ANGIOGRAPHY;  Surgeon: Larey Dresser, MD;  Location: [place] [facility] LAB;  Service: Cardiovascular;  Laterality: N/A;  . PROSTATECTOMY      reports that he quit smoking about 31 years ago. He has never used smokeless tobacco. He reports that he does not drink alcohol and does not use drugs. family history includes Coronary artery disease in an  other family member; Hypertension in an other family member; Stroke in an other family member. No Known Allergies Current Outpatient Medications on File Prior to Visit  Medication Sig Dispense Refill  . albuterol (PROVENTIL HFA;VENTOLIN HFA) 108 (90 Base) MCG/ACT inhaler TAKE 2 PUFFS BY MOUTH EVERY 6 HOURS AS NEEDED FOR WHEEZE OR SHORTNESS OF BREATH (Patient taking differently: Inhale 2 puffs into the lungs every 6 (six) hours as needed for wheezing or shortness of breath. ) 8.5 Inhaler 5  . aspirin 81 MG EC tablet TAKE 1 TABLET BY MOUTH EVERY DAY (Patient taking differently: Take 81 mg by mouth daily. ) 90 tablet 3  . carvedilol (COREG) 6.25 MG tablet TAKE 1 TABLET (6.25 MG TOTAL) BY MOUTH 2 (TWO) TIMES  DAILY WITH A MEAL. 180 tablet 1  . dorzolamide-timolol (COSOPT) 22.3-6.8 MG/ML ophthalmic solution Place 1 drop into both eyes 2 (two) times daily.     Marland Kitchen ENTRESTO 97-103 MG TAKE 1 TABLET BY MOUTH TWICE A DAY (Patient taking differently: Take 1 tablet by mouth in the morning and at bedtime. ) 180 tablet 3  . furosemide (LASIX) 20 MG tablet Take 1 tablet (20 mg total) by mouth every other day. 30 tablet 6  . latanoprost (XALATAN) 0.005 % ophthalmic solution Place 1 drop into both eyes at bedtime.     . meloxicam (MOBIC) 7.5 MG tablet Take 1 tablet (7.5 mg total) by mouth daily. 90 tablet 3  . ondansetron (ZOFRAN ODT) 8 MG disintegrating tablet Take 1 tablet (8 mg total) by mouth every 8 (eight) hours as needed for nausea. 20 tablet 0  . rosuvastatin (CRESTOR) 10 MG tablet TAKE 1 TABLET BY MOUTH EVERY DAY 90 tablet 0  . SPIRIVA HANDIHALER 18 MCG inhalation capsule INHALE 1 CAPSULE VIA HANDIHALER ONCE DAILY AT THE SAME TIME EVERY DAY (Patient taking differently: Place 18 mcg into inhaler and inhale daily. ) 30 capsule 5  . spironolactone (ALDACTONE) 25 MG tablet TAKE 1 TABLET BY MOUTH EVERY DAY 90 tablet 3   No current facility-administered medications on file prior to visit.   Review of Systems All otherwise neg per pt    Objective:   Physical Exam BP 130/60 (BP Location: Left Arm, Patient Position: Sitting, Cuff Size: Large)   Pulse (!) 57   Temp 99.1 F (37.3 C) (Oral)   Ht 6\' 3"  (1.905 m)   Wt 275 lb (124.7 kg)   SpO2 98%   BMI 34.37 kg/m  VS noted,  Constitutional: Pt appears in NAD HENT: Head: NCAT.  Right Ear: External ear normal.  Left Ear: External ear normal.  Eyes: . Pupils are equal, round, and reactive to light. Conjunctivae and EOM are normal Nose: without d/c or deformity Neck: Neck supple. Gross normal ROM Cardiovascular: Normal rate and regular rhythm.   Pulmonary/Chest: Effort normal and breath sounds without rales or wheezing.  Abd:  Soft, NT, ND, + BS, no  organomegaly , no flank tender Neurological: Pt is alert. At baseline orientation, motor grossly intact Skin: Skin is warm. No rashes, other new lesions, no LE edema Psychiatric: Pt behavior is normal without agitation  All otherwise neg per pt  Lab Results  Component Value Date   WBC 6.1 05/17/2020   HGB 13.4 05/17/2020   HCT 41.4 05/17/2020   PLT 150 05/17/2020   GLUCOSE 146 (H) 05/17/2020   CHOL 106 05/15/2020   TRIG 49.0 05/15/2020   HDL 40.80 05/15/2020   LDLCALC 55 05/15/2020   ALT 12 05/15/2020  AST 17 05/15/2020   NA 137 05/17/2020   K 4.1 05/17/2020   CL 102 05/17/2020   CREATININE 1.17 05/17/2020   BUN 9 05/17/2020   CO2 26 05/17/2020   TSH 1.38 05/15/2020   PSA 1.65 11/09/2017   INR 1.09 01/30/2018   HGBA1C 5.3 05/15/2020       Assessment & Plan:

## 2020-05-28 NOTE — Assessment & Plan Note (Addendum)
Mild to mod, needs further antibx for undertreatment, for repeat urine studies, and cipro course, rocephin 1 gm im,  to f/u any worsening symptoms or concerns  I spent 31 minutes in preparing to see the patient by review of recent labs, imaging and procedures, obtaining and reviewing separately obtained history, communicating with the patient and family or caregiver, ordering medications, tests or procedures, and documenting clinical information in the EHR including the differential Dx, treatment, and any further evaluation and other management of undertreatment uti, hyperglycemia, htn, copd

## 2020-05-29 LAB — URINE CULTURE: Result:: NO GROWTH

## 2020-05-29 LAB — URINALYSIS, ROUTINE W REFLEX MICROSCOPIC
Bacteria, UA: NONE SEEN /HPF
Bilirubin Urine: NEGATIVE
Glucose, UA: NEGATIVE
Hgb urine dipstick: NEGATIVE
Hyaline Cast: NONE SEEN /LPF
Ketones, ur: NEGATIVE
Nitrite: POSITIVE — AB
Specific Gravity, Urine: 1.024 (ref 1.001–1.03)
Squamous Epithelial / HPF: NONE SEEN /HPF (ref <=5)
pH: <=5 (ref 5.0–8.0)

## 2020-05-30 ENCOUNTER — Encounter: Payer: Self-pay | Admitting: Internal Medicine

## 2020-06-18 DIAGNOSIS — H401133 Primary open-angle glaucoma, bilateral, severe stage: Secondary | ICD-10-CM | POA: Diagnosis not present

## 2020-07-09 ENCOUNTER — Other Ambulatory Visit (HOSPITAL_COMMUNITY): Payer: Self-pay | Admitting: Cardiology

## 2020-07-17 ENCOUNTER — Other Ambulatory Visit (HOSPITAL_COMMUNITY): Payer: Self-pay | Admitting: Cardiology

## 2020-08-15 ENCOUNTER — Ambulatory Visit (HOSPITAL_BASED_OUTPATIENT_CLINIC_OR_DEPARTMENT_OTHER)
Admission: RE | Admit: 2020-08-15 | Discharge: 2020-08-15 | Disposition: A | Discharge status: Home / Self Care | Payer: Medicare Other | Source: Ambulatory Visit | Attending: Internal Medicine | Admitting: Internal Medicine

## 2020-08-15 ENCOUNTER — Ambulatory Visit (HOSPITAL_COMMUNITY)
Admission: RE | Admit: 2020-08-15 | Discharge: 2020-08-15 | Disposition: A | Discharge status: Home / Self Care | Payer: Medicare Other | Source: Ambulatory Visit | Attending: Cardiology | Admitting: Cardiology

## 2020-08-15 ENCOUNTER — Encounter (HOSPITAL_COMMUNITY): Payer: Self-pay | Admitting: Cardiology

## 2020-08-15 ENCOUNTER — Other Ambulatory Visit: Payer: Self-pay

## 2020-08-15 VITALS — Ht 75.0 in | Wt 282.2 lb

## 2020-08-15 DIAGNOSIS — Z79899 Other long term (current) drug therapy: Secondary | ICD-10-CM | POA: Insufficient documentation

## 2020-08-15 DIAGNOSIS — R5383 Other fatigue: Secondary | ICD-10-CM | POA: Diagnosis not present

## 2020-08-15 DIAGNOSIS — R42 Dizziness and giddiness: Secondary | ICD-10-CM

## 2020-08-15 DIAGNOSIS — Z791 Long term (current) use of non-steroidal anti-inflammatories (NSAID): Secondary | ICD-10-CM | POA: Insufficient documentation

## 2020-08-15 DIAGNOSIS — I428 Other cardiomyopathies: Secondary | ICD-10-CM | POA: Insufficient documentation

## 2020-08-15 DIAGNOSIS — I5022 Chronic systolic (congestive) heart failure: Secondary | ICD-10-CM | POA: Insufficient documentation

## 2020-08-15 DIAGNOSIS — Z8249 Family history of ischemic heart disease and other diseases of the circulatory system: Secondary | ICD-10-CM | POA: Insufficient documentation

## 2020-08-15 DIAGNOSIS — Z87891 Personal history of nicotine dependence: Secondary | ICD-10-CM | POA: Insufficient documentation

## 2020-08-15 DIAGNOSIS — I739 Peripheral vascular disease, unspecified: Secondary | ICD-10-CM | POA: Insufficient documentation

## 2020-08-15 DIAGNOSIS — J449 Chronic obstructive pulmonary disease, unspecified: Secondary | ICD-10-CM | POA: Diagnosis not present

## 2020-08-15 DIAGNOSIS — R0683 Snoring: Secondary | ICD-10-CM | POA: Diagnosis not present

## 2020-08-15 DIAGNOSIS — I110 Hypertensive heart disease with heart failure: Secondary | ICD-10-CM | POA: Insufficient documentation

## 2020-08-15 DIAGNOSIS — I495 Sick sinus syndrome: Secondary | ICD-10-CM

## 2020-08-15 DIAGNOSIS — I2510 Atherosclerotic heart disease of native coronary artery without angina pectoris: Secondary | ICD-10-CM | POA: Diagnosis not present

## 2020-08-15 DIAGNOSIS — Z7982 Long term (current) use of aspirin: Secondary | ICD-10-CM | POA: Diagnosis not present

## 2020-08-15 DIAGNOSIS — E785 Hyperlipidemia, unspecified: Secondary | ICD-10-CM | POA: Insufficient documentation

## 2020-08-15 LAB — BASIC METABOLIC PANEL
Anion gap: 5 (ref 5–15)
BUN: 9 mg/dL (ref 8–23)
CO2: 26 mmol/L (ref 22–32)
Calcium: 9 mg/dL (ref 8.9–10.3)
Chloride: 108 mmol/L (ref 98–111)
Creatinine, Ser: 1 mg/dL (ref 0.61–1.24)
GFR calc Af Amer: >60 mL/min (ref >60)
GFR calc non Af Amer: >60 mL/min (ref >60)
Glucose, Bld: 115 mg/dL — ABNORMAL HIGH (ref 70–99)
Potassium: 4.2 mmol/L (ref 3.5–5.1)
Sodium: 139 mmol/L (ref 135–145)

## 2020-08-15 LAB — ECHOCARDIOGRAM COMPLETE
Area-P 1/2: 2.42 cm2
Calc EF: 46 %
S' Lateral: 4.1 cm
Single Plane A2C EF: 49.2 %
Single Plane A4C EF: 43.9 %

## 2020-08-15 NOTE — Progress Notes (Signed)
Height:      Weight: 282lbs BMI: 35.27  Today's Date: 08/15/20  STOP BANG RISK ASSESSMENT S (snore) Have you been told that you snore?     YES   T (tired) Are you often tired, fatigued, or sleepy during the day?   YES  O (obstruction) Do you stop breathing, choke, or gasp during sleep? NO   P (pressure) Do you have or are you being treated for high blood pressure? YES   B (BMI) Is your body index greater than 35 kg/m? YES   A (age) Are you 81 years old or older? YES   N (neck) Do you have a neck circumference greater than 16 inches?   NO   G (gender) Are you a male? YES   TOTAL STOP/BANG "YES" ANSWERS                                                                        For Office Use Only              Procedure Order Form    YES to 3+ Stop Bang questions OR two clinical symptoms - patient qualifies for WatchPAT (CPT 95800)      Clinical Notes: Will consult Sleep Specialist and refer for management of therapy due to patient increased risk of Sleep Apnea. Ordering a sleep study due to the following two clinical symptoms: Excessive daytime sleepiness G47.10 / Loud snoring R06.83   Which test do you need, WP1 or WP300???  . Do you have access to a smart device containing the app stores?  [id number]

## 2020-08-15 NOTE — Patient Instructions (Signed)
Labs done today, your results will be available in MyChart, we will contact you for abnormal readings.  STOP Lasix  Your provider has recommended that  you wear a Zio Patch for 3 days.  This monitor will record your heart rhythm for our review.  IF you have any symptoms while wearing the monitor please press the button.  If you have any issues with the patch or you notice a red or orange light on it please call the company at [phone].  Once you remove the patch please mail it back to the company as soon as possible so we can get the results.  Your physician has recommended a sleep study. They will contact you to schedule an appointment  Please follow up with our office in 4 months  If you have any questions or concerns before your next appointment please send [place] a message through [place] or call our office at [phone].    TO LEAVE A MESSAGE FOR THE NURSE SELECT OPTION 2, PLEASE LEAVE A MESSAGE INCLUDING: . YOUR NAME . DATE OF BIRTH . CALL BACK NUMBER . REASON FOR CALL**this is important as we prioritize the call backs  [place] AS LONG AS YOU CALL BEFORE 4:00 PM  At the [facility], you and your health needs are our priority. As part of our continuing mission to provide you with exceptional heart care, we have created designated Provider Care Teams. These Care Teams include your primary Cardiologist (physician) and Advanced Practice Providers (APPs- Physician Assistants and Nurse Practitioners) who all work together to provide you with the care you need, when you need it.   You may see any of the following providers on your designated Care Team at your next follow up: Marland Kitchen Dr Glori Bickers . Dr Loralie Champagne . Darrick Grinder, NP . Lyda Jester, PA . Audry Riles, PharmD   Please be sure to bring in all your medications bottles to every appointment.

## 2020-08-15 NOTE — Progress Notes (Signed)
  Echocardiogram 2D Echocardiogram has been performed.  Dylan Benton 08/15/2020, 10:43 AM

## 2020-08-15 NOTE — Progress Notes (Signed)
Zio patch placed onto patient.  All instructions and information reviewed with patient, they verbalize understanding with no questions. 

## 2020-08-16 ENCOUNTER — Other Ambulatory Visit: Payer: Self-pay | Admitting: Internal Medicine

## 2020-08-17 NOTE — Progress Notes (Signed)
PCP: Dr. Jenny Reichmann Cardiology: Dr. Gwenlyn Found HF Cardiology: Dr. Aundra Dubin  79 y.o. with history of HTN and cardiomyopathy of uncertain etiology was referred by Dr. Gwenlyn Found for evaluation of dyspnea and CHF.  Patient was first found to have decreased systolic function in 1/02 when echo showed EF 45-50%. At that time, he had mild exertional dyspnea. Last summer, he had gotten to the point where he had to take frequent breaks mowing the lawn.  However, since the beginning of this year, he has had worsening dyspnea.  Echo was done in 1/19, showing EF down to 35-40%.  Cardiolite did not show a definite perfusion defect. 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 Cardiac MRI in 4/19 showed EF 46%, LGE pattern that was concerning for myocarditis. PYP scan was negative.   Echo in 7/20 showed  EF 45%, RV normal.  Echo was done today and reviewed, EF 45-50%, diffuse hypokinesis with normal RV.   He returns for followup of CHF.  He had a recent UTI, was treated with IV antibiotics in the ER and sent home with po meds.  Weight is up 2 lbs.  He is occasionally short of breath with moderate exertion but not always. No orthopnea/PND. No chest pain. He has lightheaded spells, worse in the last couple of weeks.  Usually when standing, but orthostatics were measured today and were negative.  No syncope. He does have fatigue and daytime sleepiness, have been concerned for OSA. No significant claudication.   Labs (12/18): LDL 47 Labs (3/19): K 4.3, creatinine 1.09 Labs (4/19): Myeloma pattern negative Labs (5/19): K 4.6, creatinine 1.06 Labs (6/19): LDL 48, HDl 42, K 4, creatinine 1.15 Labs (6/20): LDL 51, HDl 37, K 4.4, creatinine 1.12, LFTs normal, TSH normal Labs (12/20): LDL 64, HDL 43 Labs (3/21): K 4.1, creatinine 0.98 Labs (6/21): K 4.1, creatinine 1.17  PMH: 1. Low back pain 2. Cardiomyopathy: LHC in 2006 with normal coronaries.  Echo in 1/17 with EF 45-50%.  - Echo (1/19): EF 35-40%, mild LVH - Cardiolite (2/19): EF 41%,  no ischemia/infarction.  - LHC (3/19): 50% ostial LAD, 50% ramus. LVEDP 12.  - Cardiac MRI (4/19): EF 46%, normal RV size and systolic function, LGE pattern suggestive of prior myocarditis.  - PYP scan (4/19): Negative, not suggestive of TTR amyloidosis. - Myeloma panel negative - Echo (7/20): EF 45%, RV normal, IVC normal - Echo (9/21): EF 45-50%, diffuse hypokinesis, normal RV, IVC normal.  3. HTN 4. Hyperlipidemia 5. CAD: LHC (3/19) with 50% ostial LAD, 50% ramus. 6. COPD: Prior smoker.  PFTs 11/19 with moderate obstruction.  7. Vasovagal syncope/presyncope.  8. PAD: peripheral arterial dopplers (3/21) showed occluded right AT artery, 30-49% left SFA.   Social History   Socioeconomic History  . Marital status: Married    Spouse name: Not on file  . Number of children: 2  . Years of education: 91  . Highest education level: Not on file  Occupational History  . Occupation: Camera operator (Part-time)  Tobacco Use  . Smoking status: Former Smoker    Quit date: 12/02/1988    Years since quitting: 31.7  . Smokeless tobacco: Never Used  Substance and Sexual Activity  . Alcohol use: No  . Drug use: No  . Sexual activity: Not on file  Other Topics Concern  . Not on file  Social History Narrative   Works for [employer] at [employer] in [place].   Social Determinants of Health   Financial Resource Strain: Low Risk   .  Difficulty of Paying Living Expenses: Not hard at all  Food Insecurity: No Food Insecurity  . Worried About Charity fundraiser in the Last Year: Never true  . Ran Out of Food in the Last Year: Never true  Transportation Needs: No Transportation Needs  . Lack of Transportation (Medical): No  . Lack of Transportation (Non-Medical): No  Physical Activity: Insufficiently Active  . Days of Exercise per Week: 3 days  . Minutes of Exercise per Session: 30 min  Stress: No Stress Concern Present  . Feeling of Stress : Not at all  Social Connections: Moderately Isolated   . Frequency of Communication with Friends and Family: More than three times a week  . Frequency of Social Gatherings with Friends and Family: More than three times a week  . Attends Religious Services: Never  . Active Member of Clubs or Organizations: No  . Attends Archivist Meetings: Never  . Marital Status: Married  Human resources officer Violence:   . Fear of Current or Ex-Partner: Not on file  . Emotionally Abused: Not on file  . Physically Abused: Not on file  . Sexually Abused: Not on file   Family History  Problem Relation Age of Onset  . Coronary artery disease Other   . Hypertension Other   . Stroke Other   . Colon cancer Neg Hx   . Esophageal cancer Neg Hx   . Rectal cancer Neg Hx   . Stomach cancer Neg Hx    ROS: All systems reviewed and negative except as per HPI.  Current Outpatient Medications  Medication Sig Dispense Refill  . albuterol (PROVENTIL HFA;VENTOLIN HFA) 108 (90 Base) MCG/ACT inhaler TAKE 2 PUFFS BY MOUTH EVERY 6 HOURS AS NEEDED FOR WHEEZE OR SHORTNESS OF BREATH (Patient taking differently: Inhale 2 puffs into the lungs every 6 (six) hours as needed for wheezing or shortness of breath. ) 8.5 Inhaler 5  . aspirin 81 MG EC tablet TAKE 1 TABLET BY MOUTH EVERY DAY 90 tablet 3  . carvedilol (COREG) 6.25 MG tablet TAKE 1 TABLET (6.25 MG TOTAL) BY MOUTH 2 (TWO) TIMES DAILY WITH A MEAL. 180 tablet 1  . dorzolamide-timolol (COSOPT) 22.3-6.8 MG/ML ophthalmic solution Place 1 drop into both eyes 2 (two) times daily.     Marland Kitchen ENTRESTO 97-103 MG TAKE 1 TABLET BY MOUTH TWICE A DAY (Patient taking differently: Take 1 tablet by mouth in the morning and at bedtime. ) 180 tablet 3  . latanoprost (XALATAN) 0.005 % ophthalmic solution Place 1 drop into both eyes at bedtime.     . meloxicam (MOBIC) 7.5 MG tablet Take 1 tablet (7.5 mg total) by mouth daily. 90 tablet 3  . rosuvastatin (CRESTOR) 10 MG tablet TAKE 1 TABLET BY MOUTH EVERY DAY 90 tablet 0  . SPIRIVA HANDIHALER  18 MCG inhalation capsule INHALE 1 CAPSULE VIA HANDIHALER ONCE DAILY AT THE SAME TIME EVERY DAY (Patient taking differently: Place 18 mcg into inhaler and inhale daily. ) 30 capsule 5  . spironolactone (ALDACTONE) 25 MG tablet TAKE 1 TABLET BY MOUTH EVERY DAY 90 tablet 3  . ondansetron (ZOFRAN ODT) 8 MG disintegrating tablet Take 1 tablet (8 mg total) by mouth every 8 (eight) hours as needed for nausea. (Patient not taking: Reported on 08/15/2020) 20 tablet 0   No current facility-administered medications for this encounter.   Ht 6\' 3"  (1.905 m)   Wt 128 kg (282 lb 3.2 oz)   SpO2 98%   BMI 35.27 kg/m  General: NAD Neck: No JVD, no thyromegaly or thyroid nodule.  Lungs: Clear to auscultation bilaterally with normal respiratory effort. CV: Nondisplaced PMI.  Heart regular S1/S2, no S3/S4, no murmur.  No peripheral edema.  No carotid bruit.  Normal pedal pulses.  Abdomen: Soft, nontender, no hepatosplenomegaly, no distention.  Skin: Intact without lesions or rashes.  Neurologic: Alert and oriented x 3.  Psych: Normal affect. Extremities: No clubbing or cyanosis.  HEENT: Normal.   Assessment/Plan: 1. Chronic systolic CHF: Echo in 6/81 with EF 35-40%, mild LVH. LHC in 3/19 with nonobstructive CAD.  Cardiac MRI in 4/19 showed EF 46%, LGE pattern concerning for prior myocarditis. PYP scan was negative, not suggestive of TTR amyloidosis. Myeloma panel negative.  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 Echo in 7/20 showed EF remains 45%, echo in 9/21 with EF 45-50%.  On exam, he is not volume overloaded. NYHA class II symptoms, stable.  - Continue Coreg 6.25 mg bid.  With bradycardia, will not increase dose.  - Continue Entresto 97/103 mg bid. BMET today.  - With orthostatic-type symptoms, I will have him stop Lasix, can use prn.  - Continue spironolactone 25 mg daily.  2. HTN: BP elevated today but has orthostatic-type symptoms so I will not increase his  anti-hypertensive regimen.   3. Hyperlipidemia.  Continue Crestor, good lipids in 12/20.  4. COPD: Moderate by PFTs.  5. Suspect OSA: Daytime sleepiness/fatigue.  - I will arrange for sleep study.   6. PAD: Occluded right AT.  However, he does not seem to have significant claudication.  - Continue statin, ASA.  7. Lightheadedness: Sounds orthostatic in nature but not orthostatic when measured in office today.  - As above, will stop Lasix.  - Though does not sound arrhythmic, given cardiomyopathy I will arrange for 3 day Zio patch to assess for arrhythmic cause of lightheadedness.   Followup 4 months   Loralie Champagne 08/17/2020

## 2020-08-17 NOTE — Telephone Encounter (Signed)
Please refill as per office routine med refill policy (all routine meds refilled for 3 mo or monthly per pt preference up to one year from last visit, then month to month grace period for 3 mo, then further med refills will have to be denied)  

## 2020-08-26 ENCOUNTER — Telehealth (HOSPITAL_COMMUNITY): Payer: Self-pay | Admitting: Vascular Surgery

## 2020-08-26 DIAGNOSIS — R001 Bradycardia, unspecified: Secondary | ICD-10-CM | POA: Diagnosis not present

## 2020-08-26 NOTE — Telephone Encounter (Signed)
Left pt message giving sleep study appt, asked pt to call back to confirm appt

## 2020-09-18 DIAGNOSIS — H401133 Primary open-angle glaucoma, bilateral, severe stage: Secondary | ICD-10-CM | POA: Diagnosis not present

## 2020-09-20 ENCOUNTER — Other Ambulatory Visit: Payer: Self-pay

## 2020-09-20 ENCOUNTER — Ambulatory Visit: Payer: Medicare Other | Attending: Internal Medicine

## 2020-09-20 DIAGNOSIS — Z23 Encounter for immunization: Secondary | ICD-10-CM

## 2020-09-20 NOTE — Progress Notes (Signed)
   Covid-19 Vaccination Clinic  Name:  Dylan Benton    MRN: [id number] DOB: 12/20/1940  09/20/2020  [name] was observed post Covid-19 immunization for 15 minutes without incident. He was provided with Vaccine Information Sheet and instruction to access the V-Safe system.   [name] was instructed to call 911 with any severe reactions post vaccine: Marland Kitchen Difficulty breathing  . Swelling of face and throat  . A fast heartbeat  . A bad rash all over body  . Dizziness and weakness

## 2020-09-24 ENCOUNTER — Encounter (HOSPITAL_BASED_OUTPATIENT_CLINIC_OR_DEPARTMENT_OTHER): Payer: Medicare Other | Admitting: Cardiology

## 2020-11-17 ENCOUNTER — Ambulatory Visit: Payer: Medicare Other | Admitting: Internal Medicine

## 2020-11-23 ENCOUNTER — Other Ambulatory Visit: Payer: Self-pay | Admitting: Internal Medicine

## 2020-11-23 NOTE — Telephone Encounter (Signed)
Please refill as per office routine med refill policy (all routine meds refilled for 3 mo or monthly per pt preference up to one year from last visit, then month to month grace period for 3 mo, then further med refills will have to be denied)  

## 2020-11-27 ENCOUNTER — Ambulatory Visit: Payer: Medicare Other | Admitting: Internal Medicine

## 2020-12-03 ENCOUNTER — Encounter (HOSPITAL_COMMUNITY): Payer: Self-pay | Admitting: Cardiology

## 2020-12-03 ENCOUNTER — Ambulatory Visit (HOSPITAL_COMMUNITY)
Admission: RE | Admit: 2020-12-03 | Discharge: 2020-12-03 | Disposition: A | Discharge status: Home / Self Care | Payer: Medicare Other | Source: Ambulatory Visit | Attending: Cardiology | Admitting: Cardiology

## 2020-12-03 ENCOUNTER — Other Ambulatory Visit: Payer: Self-pay

## 2020-12-03 VITALS — BP 100/58 | HR 61 | Wt 284.6 lb

## 2020-12-03 DIAGNOSIS — J449 Chronic obstructive pulmonary disease, unspecified: Secondary | ICD-10-CM | POA: Diagnosis not present

## 2020-12-03 DIAGNOSIS — E785 Hyperlipidemia, unspecified: Secondary | ICD-10-CM | POA: Insufficient documentation

## 2020-12-03 DIAGNOSIS — Z7982 Long term (current) use of aspirin: Secondary | ICD-10-CM | POA: Insufficient documentation

## 2020-12-03 DIAGNOSIS — I110 Hypertensive heart disease with heart failure: Secondary | ICD-10-CM | POA: Insufficient documentation

## 2020-12-03 DIAGNOSIS — Z791 Long term (current) use of non-steroidal anti-inflammatories (NSAID): Secondary | ICD-10-CM | POA: Insufficient documentation

## 2020-12-03 DIAGNOSIS — R42 Dizziness and giddiness: Secondary | ICD-10-CM | POA: Insufficient documentation

## 2020-12-03 DIAGNOSIS — I428 Other cardiomyopathies: Secondary | ICD-10-CM | POA: Diagnosis not present

## 2020-12-03 DIAGNOSIS — Z79899 Other long term (current) drug therapy: Secondary | ICD-10-CM | POA: Diagnosis not present

## 2020-12-03 DIAGNOSIS — I2510 Atherosclerotic heart disease of native coronary artery without angina pectoris: Secondary | ICD-10-CM | POA: Insufficient documentation

## 2020-12-03 DIAGNOSIS — I5022 Chronic systolic (congestive) heart failure: Secondary | ICD-10-CM | POA: Diagnosis not present

## 2020-12-03 DIAGNOSIS — Z7984 Long term (current) use of oral hypoglycemic drugs: Secondary | ICD-10-CM | POA: Insufficient documentation

## 2020-12-03 DIAGNOSIS — I739 Peripheral vascular disease, unspecified: Secondary | ICD-10-CM | POA: Diagnosis not present

## 2020-12-03 LAB — BASIC METABOLIC PANEL
Anion gap: 8 (ref 5–15)
BUN: 9 mg/dL (ref 8–23)
CO2: 25 mmol/L (ref 22–32)
Calcium: 9 mg/dL (ref 8.9–10.3)
Chloride: 104 mmol/L (ref 98–111)
Creatinine, Ser: 0.96 mg/dL (ref 0.61–1.24)
GFR, Estimated: >60 mL/min (ref >60)
Glucose, Bld: 117 mg/dL — ABNORMAL HIGH (ref 70–99)
Potassium: 4.3 mmol/L (ref 3.5–5.1)
Sodium: 137 mmol/L (ref 135–145)

## 2020-12-03 MED ORDER — DAPAGLIFLOZIN PROPANEDIOL 10 MG PO TABS
10.0000 mg | ORAL_TABLET | Freq: Every day | ORAL | 6 refills | Status: DC
Start: 2020-12-03 — End: 2021-05-06

## 2020-12-03 NOTE — Progress Notes (Signed)
Heart Failure TeleHealth Note  Due to national recommendations of social distancing due to [place] 19, Audio/video telehealth visit is felt to be most appropriate for this patient at this time.  See MyChart message from today for patient consent regarding telehealth for [facility].  Date:  12/03/2020   ID:  Dylan Benton, DOB 1941-04-10, MRN [id number]  Location: Home  Provider location: [facility] Type of Visit: Established patient   PCP:  Biagio Borg, MD  Cardiologist:  Quay Burow, MD Primary HF: Dr. Aundra Dubin   History of Present Illness: Dylan Benton is a 80 y.o. male who presents via audio/video conferencing for a telehealth visit today.     he denies symptoms worrisome for COVID 19.   Patient has a history of HTN and cardiomyopathy of uncertain etiology.  He was referred by Dr. Gwenlyn Found for evaluation of dyspnea and CHF.  Patient was first found to have decreased systolic function in 0000000 when echo showed EF 45-50%. At that time, he had mild exertional dyspnea. Last summer, he had gotten to the point where he had to take frequent breaks mowing the lawn.  However, since the beginning of this year, he has had worsening dyspnea.  Echo was done in 1/19, showing EF down to 35-40%.  Cardiolite did not show a definite perfusion defect. 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 Cardiac MRI in 4/19 showed EF 46%, LGE pattern that was concerning for myocarditis. PYP scan was negative.   Echo in 7/20 showed  EF 45%, RV normal.  Echo was done today and reviewed, EF 45-50%, diffuse hypokinesis with normal RV.   Zio patch x 7 days in 10/21 showed no significant arrhythmia.   He returns for followup of CHF.  Generally doing well.  Needs to reschedule sleep study.  Still has daytime sleepiness and fatigue.  Minimal edema.  Gets tired after walking about 100 feet but not short of breath.  No orthopnea/PND.  No chest pain.  No lightheadedness.   ECG (personally  reviewed): NSR, normal  Labs (12/18): LDL 47 Labs (3/19): K 4.3, creatinine 1.09 Labs (4/19): Myeloma pattern negative Labs (5/19): K 4.6, creatinine 1.06 Labs (6/19): LDL 48, HDl 42, K 4, creatinine 1.15 Labs (6/20): LDL 51, HDl 37, K 4.4, creatinine 1.12, LFTs normal, TSH normal Labs (12/20): LDL 64, HDL 43 Labs (3/21): K 4.1, creatinine 0.98 Labs (6/21): K 4.1, creatinine 1.17 Labs (9/21): K 4.2, creatinine 1.0  PMH: 1. Low back pain 2. Cardiomyopathy: LHC in 2006 with normal coronaries.  Echo in 1/17 with EF 45-50%.  - Echo (1/19): EF 35-40%, mild LVH - Cardiolite (2/19): EF 41%, no ischemia/infarction.  - LHC (3/19): 50% ostial LAD, 50% ramus. LVEDP 12.  - Cardiac MRI (4/19): EF 46%, normal RV size and systolic function, LGE pattern suggestive of prior myocarditis.  - PYP scan (4/19): Negative, not suggestive of TTR amyloidosis. - Myeloma panel negative - Echo (7/20): EF 45%, RV normal, IVC normal - Echo (9/21): EF 45-50%, diffuse hypokinesis, normal RV, IVC normal.  3. HTN 4. Hyperlipidemia 5. CAD: LHC (3/19) with 50% ostial LAD, 50% ramus. 6. COPD: Prior smoker.  PFTs 11/19 with moderate obstruction.  7. Vasovagal syncope/presyncope.  8. PAD: peripheral arterial dopplers (3/21) showed occluded right AT artery, 30-49% left SFA.  9. Zio patch (10/21): x 7 days, no significant arrhythmias.   Social History   Socioeconomic History  . Marital status: Married    Spouse name:  Not on file  . Number of children: 2  . Years of education: 64  . Highest education level: Not on file  Occupational History  . Occupation: Camera operator (Part-time)  Tobacco Use  . Smoking status: Former Smoker    Quit date: 12/02/1988    Years since quitting: 32.0  . Smokeless tobacco: Never Used  Substance and Sexual Activity  . Alcohol use: No  . Drug use: No  . Sexual activity: Not on file  Other Topics Concern  . Not on file  Social History Narrative   Works for [employer] at [employer] in  [place].   Social Determinants of Health   Financial Resource Strain: Low Risk   . Difficulty of Paying Living Expenses: Not hard at all  Food Insecurity: No Food Insecurity  . Worried About Charity fundraiser in the Last Year: Never true  . Ran Out of Food in the Last Year: Never true  Transportation Needs: No Transportation Needs  . Lack of Transportation (Medical): No  . Lack of Transportation (Non-Medical): No  Physical Activity: Insufficiently Active  . Days of Exercise per Week: 3 days  . Minutes of Exercise per Session: 30 min  Stress: No Stress Concern Present  . Feeling of Stress : Not at all  Social Connections: Moderately Isolated  . Frequency of Communication with Friends and Family: More than three times a week  . Frequency of Social Gatherings with Friends and Family: More than three times a week  . Attends Religious Services: Never  . Active Member of Clubs or Organizations: No  . Attends Archivist Meetings: Never  . Marital Status: Married  Human resources officer Violence: Not on file   Family History  Problem Relation Age of Onset  . Coronary artery disease Other   . Hypertension Other   . Stroke Other   . Colon cancer Neg Hx   . Esophageal cancer Neg Hx   . Rectal cancer Neg Hx   . Stomach cancer Neg Hx    ROS: All systems reviewed and negative except as per HPI.  Current Outpatient Medications  Medication Sig Dispense Refill  . aspirin 81 MG EC tablet TAKE 1 TABLET BY MOUTH EVERY DAY 90 tablet 3  . carvedilol (COREG) 6.25 MG tablet TAKE 1 TABLET (6.25 MG TOTAL) BY MOUTH 2 (TWO) TIMES DAILY WITH A MEAL. 180 tablet 1  . dapagliflozin propanediol (FARXIGA) 10 MG TABS tablet Take 1 tablet (10 mg total) by mouth daily before breakfast. 30 tablet 6  . ENTRESTO 97-103 MG TAKE 1 TABLET BY MOUTH TWICE A DAY 180 tablet 3  . latanoprost (XALATAN) 0.005 % ophthalmic solution Place 1 drop into both eyes at bedtime.     . meloxicam (MOBIC) 7.5 MG tablet Take  1 tablet (7.5 mg total) by mouth daily. 90 tablet 3  . rosuvastatin (CRESTOR) 10 MG tablet TAKE 1 TABLET BY MOUTH EVERY DAY 90 tablet 0  . SPIRIVA HANDIHALER 18 MCG inhalation capsule INHALE 1 CAPSULE VIA HANDIHALER ONCE DAILY AT THE SAME TIME EVERY DAY 30 capsule 5  . spironolactone (ALDACTONE) 25 MG tablet TAKE 1 TABLET BY MOUTH EVERY DAY 90 tablet 3   No current facility-administered medications for this encounter.   BP (!) 100/58   Pulse 61   Wt 129.1 kg (284 lb 9.6 oz)   SpO2 98%   BMI 35.57 kg/m  Exam:  (Video/Tele Health Call; Exam is subjective and or/visual.) General:  Speaks in full sentences. No resp  difficulty. Lungs: Normal respiratory effort with conversation.  Abdomen: Non-distended per patient report Extremities: Pt denies edema. Neuro: Alert & oriented x 3.   Assessment/Plan: 1. Chronic systolic CHF: Echo in 9/62 with EF 35-40%, mild LVH. LHC in 3/19 with nonobstructive CAD.  Cardiac MRI in 4/19 showed EF 46%, LGE pattern concerning for prior myocarditis. PYP scan was negative, not suggestive of TTR amyloidosis. Myeloma panel negative.  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 Echo in 7/20 showed EF remains 45%, echo in 9/21 with EF 45-50%.  NYHA class II symptoms, stable.  - Continue Coreg 6.25 mg bid.  With bradycardia, will not increase dose.  - Add Jardiance 10 mg daily, BMET today and 10 days.  - Continue Entresto 97/103 mg bid. - He takes Lasix prn.  - Continue spironolactone 25 mg daily.  2. HTN: BP not elevated.  3. Hyperlipidemia.  Continue Crestor, check lipids next appt.   4. COPD: Moderate by PFTs.  5. Suspect OSA: Daytime sleepiness/fatigue.  - He needs to call to reschedule sleep study.   6. PAD: Occluded right AT.  However, he does not seem to have significant claudication.  - Continue statin, ASA.  7. Lightheadedness: Sounds orthostatic in nature but not orthostatic when measured in office today.  - As above,  will stop Lasix.  - Though does not sound arrhythmic, given cardiomyopathy I will arrange for 3 day Zio patch to assess for arrhythmic cause of lightheadedness.   COVID screen The patient does not have any symptoms that suggest any further testing/ screening at this time.  Social distancing reinforced today.  Patient Risk: After full review of this patients clinical status, I feel that they are at moderate risk for cardiac decompensation at this time.  Relevant cardiac medications were reviewed at length with the patient today. The patient does not have concerns regarding their medications at this time.   Recommended follow-up:  3-4 months.   Today, I have spent 18 minutes with the patient with telehealth technology discussing the above issues .    Signed, Loralie Champagne, MD  12/03/2020  [facility] [address] [facility] [place] [address] [phone] (office) [phone] (fax)

## 2020-12-03 NOTE — Progress Notes (Signed)
Patient is being seen in the Advanced Heart Failure Clinic. VS, EKG, and device interrogations performed in the clinic. Dr McLean is at home and seeing patients via telemedicine as he is in quarantine.   

## 2020-12-03 NOTE — Patient Instructions (Addendum)
Start Farxiga 10 mg Daily  Your provider has prescribed Wilder Glade for you. Please be aware the most common side effect of this medication is urinary tract infections and yeast infections. Please practice good hygiene and keep this area clean and dry to help prevent this. If you do begin to have symptoms of these infections, such as difficulty urinating or painful urination,  please let us know.  Labs done today, your results will be available in MyChart, we will contact you for abnormal readings.  Your physician recommends that you return for lab work in: 10-14 Days  Your physician recommends that you schedule a follow-up appointment in: 3-4 months  If you have any questions or concerns before your next appointment please send [place] a message through [place] or call our office at [phone].    TO LEAVE A MESSAGE FOR THE NURSE SELECT OPTION 2, PLEASE LEAVE A MESSAGE INCLUDING: . YOUR NAME . DATE OF BIRTH . CALL BACK NUMBER . REASON FOR CALL**this is important as we prioritize the call backs  [place] AS LONG AS YOU CALL BEFORE 4:00 PM  At the [facility], you and your health needs are our priority. As part of our continuing mission to provide you with exceptional heart care, we have created designated Provider Care Teams. These Care Teams include your primary Cardiologist (physician) and Advanced Practice Providers (APPs- Physician Assistants and Nurse Practitioners) who all work together to provide you with the care you need, when you need it.   You may see any of the following providers on your designated Care Team at your next follow up: Marland Kitchen Dr Glori Bickers . Dr Loralie Champagne . Darrick Grinder, NP . Lyda Jester, PA . Audry Riles, PharmD   Please be sure to bring in all your medications bottles to every appointment.

## 2020-12-15 ENCOUNTER — Other Ambulatory Visit (HOSPITAL_COMMUNITY): Payer: Self-pay | Admitting: Cardiology

## 2020-12-15 DIAGNOSIS — I10 Essential (primary) hypertension: Secondary | ICD-10-CM

## 2020-12-16 ENCOUNTER — Other Ambulatory Visit: Payer: Self-pay

## 2020-12-16 ENCOUNTER — Ambulatory Visit (HOSPITAL_COMMUNITY)
Admission: RE | Admit: 2020-12-16 | Discharge: 2020-12-16 | Disposition: A | Discharge status: Home / Self Care | Payer: Medicare Other | Source: Ambulatory Visit | Attending: Internal Medicine | Admitting: Internal Medicine

## 2020-12-16 DIAGNOSIS — I10 Essential (primary) hypertension: Secondary | ICD-10-CM | POA: Diagnosis not present

## 2020-12-16 LAB — BASIC METABOLIC PANEL
Anion gap: 9 (ref 5–15)
BUN: 11 mg/dL (ref 8–23)
CO2: 23 mmol/L (ref 22–32)
Calcium: 9.2 mg/dL (ref 8.9–10.3)
Chloride: 106 mmol/L (ref 98–111)
Creatinine, Ser: 1.14 mg/dL (ref 0.61–1.24)
GFR, Estimated: >60 mL/min (ref >60)
Glucose, Bld: 139 mg/dL — ABNORMAL HIGH (ref 70–99)
Potassium: 3.7 mmol/L (ref 3.5–5.1)
Sodium: 138 mmol/L (ref 135–145)

## 2021-01-02 ENCOUNTER — Other Ambulatory Visit: Payer: Self-pay | Admitting: Internal Medicine

## 2021-01-02 ENCOUNTER — Other Ambulatory Visit (HOSPITAL_COMMUNITY): Payer: Self-pay | Admitting: Cardiology

## 2021-01-22 ENCOUNTER — Other Ambulatory Visit: Payer: Self-pay

## 2021-01-22 ENCOUNTER — Encounter: Payer: Self-pay | Admitting: Internal Medicine

## 2021-01-22 ENCOUNTER — Ambulatory Visit (INDEPENDENT_AMBULATORY_CARE_PROVIDER_SITE_OTHER): Payer: Medicare Other | Admitting: Internal Medicine

## 2021-01-22 VITALS — BP 118/64 | HR 58 | Ht 75.0 in | Wt 275.0 lb

## 2021-01-22 DIAGNOSIS — R7302 Impaired glucose tolerance (oral): Secondary | ICD-10-CM | POA: Diagnosis not present

## 2021-01-22 DIAGNOSIS — J439 Emphysema, unspecified: Secondary | ICD-10-CM

## 2021-01-22 DIAGNOSIS — E559 Vitamin D deficiency, unspecified: Secondary | ICD-10-CM | POA: Diagnosis not present

## 2021-01-22 DIAGNOSIS — E538 Deficiency of other specified B group vitamins: Secondary | ICD-10-CM

## 2021-01-22 DIAGNOSIS — I10 Essential (primary) hypertension: Secondary | ICD-10-CM | POA: Diagnosis not present

## 2021-01-22 DIAGNOSIS — E7800 Pure hypercholesterolemia, unspecified: Secondary | ICD-10-CM

## 2021-01-22 LAB — LIPID PANEL
Cholesterol: 99 mg/dL (ref 0–200)
HDL: 40 mg/dL (ref >39.00)
LDL Cholesterol: 48 mg/dL (ref 0–99)
NonHDL: 59.11
Total CHOL/HDL Ratio: 2
Triglycerides: 57 mg/dL (ref 0.0–149.0)
VLDL: 11.4 mg/dL (ref 0.0–40.0)

## 2021-01-22 LAB — BASIC METABOLIC PANEL
BUN: 13 mg/dL (ref 6–23)
CO2: 29 mEq/L (ref 19–32)
Calcium: 9.6 mg/dL (ref 8.4–10.5)
Chloride: 104 mEq/L (ref 96–112)
Creatinine, Ser: 1.1 mg/dL (ref 0.40–1.50)
GFR: 62.77 mL/min (ref >60.00)
Glucose, Bld: 81 mg/dL (ref 70–99)
Potassium: 4.1 mEq/L (ref 3.5–5.1)
Sodium: 138 mEq/L (ref 135–145)

## 2021-01-22 LAB — URINALYSIS, ROUTINE W REFLEX MICROSCOPIC
Bilirubin Urine: NEGATIVE
Hgb urine dipstick: NEGATIVE
Ketones, ur: NEGATIVE
Leukocytes,Ua: NEGATIVE
Nitrite: NEGATIVE
RBC / HPF: NONE SEEN (ref 0–?)
Specific Gravity, Urine: 1.025 (ref 1.000–1.030)
Total Protein, Urine: NEGATIVE
Urine Glucose: >=1000 — AB
Urobilinogen, UA: 0.2 (ref 0.0–1.0)
pH: 5.5 (ref 5.0–8.0)

## 2021-01-22 LAB — CBC WITH DIFFERENTIAL/PLATELET
Basophils Absolute: 0 10*3/uL (ref 0.0–0.1)
Basophils Relative: 0.8 % (ref 0.0–3.0)
Eosinophils Absolute: 0.1 10*3/uL (ref 0.0–0.7)
Eosinophils Relative: 1.5 % (ref 0.0–5.0)
HCT: 44.6 % (ref 39.0–52.0)
Hemoglobin: 14.9 g/dL (ref 13.0–17.0)
Lymphocytes Relative: 42.8 % (ref 12.0–46.0)
Lymphs Abs: 1.8 10*3/uL (ref 0.7–4.0)
MCHC: 33.4 g/dL (ref 30.0–36.0)
MCV: 97.8 fl (ref 78.0–100.0)
Monocytes Absolute: 0.5 10*3/uL (ref 0.1–1.0)
Monocytes Relative: 11.1 % (ref 3.0–12.0)
Neutro Abs: 1.8 10*3/uL (ref 1.4–7.7)
Neutrophils Relative %: 43.8 % (ref 43.0–77.0)
Platelets: 171 10*3/uL (ref 150.0–400.0)
RBC: 4.56 Mil/uL (ref 4.22–5.81)
RDW: 12.6 % (ref 11.5–15.5)
WBC: 4.2 10*3/uL (ref 4.0–10.5)

## 2021-01-22 LAB — HEPATIC FUNCTION PANEL
ALT: 15 U/L (ref 0–53)
AST: 21 U/L (ref 0–37)
Albumin: 4.2 g/dL (ref 3.5–5.2)
Alkaline Phosphatase: 54 U/L (ref 39–117)
Bilirubin, Direct: 0.2 mg/dL (ref 0.0–0.3)
Total Bilirubin: 0.9 mg/dL (ref 0.2–1.2)
Total Protein: 6.9 g/dL (ref 6.0–8.3)

## 2021-01-22 LAB — TSH: TSH: 1.62 u[IU]/mL (ref 0.35–4.50)

## 2021-01-22 LAB — VITAMIN D 25 HYDROXY (VIT D DEFICIENCY, FRACTURES): VITD: 36.82 ng/mL (ref 30.00–100.00)

## 2021-01-22 LAB — VITAMIN B12: Vitamin B-12: 664 pg/mL (ref 211–911)

## 2021-01-22 LAB — HEMOGLOBIN A1C: Hgb A1c MFr Bld: 5.3 % (ref 4.6–6.5)

## 2021-01-22 NOTE — Progress Notes (Signed)
Patient ID: Dylan Benton, male   DOB: 02-Nov-1941, 80 y.o.   MRN: [id number]        Chief Complaint: follow up HTN, HLD and hyperglycemia , copd      HPI:  Dylan Benton is a 80 y.o. male here with c/o above, c/o generalized weakness but Pt denies chest pain, increased sob or doe, wheezing, orthopnea, PND, increased LE swelling, palpitations, dizziness or syncope.   Pt denies polydipsia, polyuria, Denies new worsening focal neuro s/s.    Pt denies fever, wt loss, night sweats, loss of appetite, or other constitutional symptoms  Lost some wt with farxiga, feels overall some weakness. Walks with cane. No recent falls.  No other new complaints Wt Readings from Last 3 Encounters:  01/22/21 275 lb (124.7 kg)  12/03/20 284 lb 9.6 oz (129.1 kg)  08/15/20 282 lb 3.2 oz (128 kg)   BP Readings from Last 3 Encounters:  01/22/21 118/64  12/03/20 (!) 100/58  05/28/20 130/60         Past Medical History:  Diagnosis Date  . Allergy   . Anemia, unspecified    NOS ?resolved  . Anxiety   . Aortic root dilation (HCC)   . Arthritis   . ASTHMA, UNSPECIFIED, UNSPECIFIED STATUS 08/06/2009   Annotation: No exacerbations.  Qualifier: Diagnosis of  By: Dylan Benton, [place]    . Bladder calculus 05/05/2012  . Cataract   . Chest pain   . ECHOCARDIOGRAM, ABNORMAL 12/26/2006    Aortic root dilation  This problem surfaced in 2006 after a cardiology referral for chest pain.  He was seen by Dr. Haroldine Benton in January o6 and cathed on Jan. 18.  Coronaries were nl and EF was 65%.  Echo showed mild aortic root dilation of 4mm. He was started on metoprolol (although I notice that he is no longer on this.  Annual echo to follow aortic root was advised.  Aortic root dimension was unchanged on studies in 2009 and 2010.  Will continue to follow this, perhaps not every year as it seems stable.   . Erectile dysfunction 05/05/2012  . GERD (gastroesophageal reflux disease) 05/05/2012  . H pylori ulcer    teated H pylori  . History  of back surgery   . Hyperlipidemia 05/08/2012  . Hypertension   . Impaired glucose tolerance 05/05/2012  . Lumbar disc disease 05/08/2012  . Male stress incontinence 05/05/2012  . Myocardial infarction [facility])    pt states, "i've been told I have had two heart attacks  . Obesity   . OBESITY 10/06/2006   Qualifier: Diagnosis of  By: Dylan Benton, Dylan Benton    . Prostate cancer ([place])    Hx of   . PROSTATE CANCER, HX OF 08/06/2009   Annotation: prostatectomy and radiotherapy in 1990s,  Dylan Benton Qualifier: Diagnosis of  By: Dylan Benton, [place]    . PUD (peptic ulcer disease) 05/05/2012  . Systolic CHF ([place]) 6/65/9935  . Thoracic aortic aneurysm ([place])   . 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 Edilia Benton Qualifier: Diagnosis of  By: Dylan Benton, Madhav     Past Surgical History:  Procedure Laterality Date  . BACK SURGERY    . ear surgury    . eye surgury    . LEFT HEART CATH AND CORONARY ANGIOGRAPHY N/A 02/06/2018   Procedure: LEFT HEART CATH AND CORONARY ANGIOGRAPHY;  Surgeon: Dylan Benton;  Location: [facility];  Service: Cardiovascular;  Laterality: N/A;  .  PROSTATECTOMY      reports that he quit smoking about 32 years ago. He has never used smokeless tobacco. He reports that he does not drink alcohol and does not use drugs. family history includes Coronary artery disease in an other family member; Hypertension in an other family member; Stroke in an other family member. No Known Allergies Current Outpatient Medications on File Prior to Visit  Medication Sig Dispense Refill  . aspirin 81 MG EC tablet TAKE 1 TABLET BY MOUTH EVERY DAY 90 tablet 3  . carvedilol (COREG) 6.25 MG tablet TAKE 1 TABLET (6.25 MG TOTAL) BY MOUTH 2 (TWO) TIMES DAILY WITH A MEAL. 180 tablet 1  . dapagliflozin propanediol (FARXIGA) 10 MG TABS tablet Take 1 tablet (10 mg total) by mouth daily before breakfast. 30 tablet 6  . dorzolamide-timolol (COSOPT) 22.3-6.8 MG/ML  ophthalmic solution Place 1 drop into both eyes 2 (two) times daily.    Marland Kitchen ENTRESTO 97-103 MG TAKE 1 TABLET BY MOUTH TWICE A DAY 180 tablet 3  . latanoprost (XALATAN) 0.005 % ophthalmic solution Place 1 drop into both eyes at bedtime.     . meloxicam (MOBIC) 7.5 MG tablet TAKE 1 TABLET BY MOUTH EVERY DAY 90 tablet 1  . rosuvastatin (CRESTOR) 10 MG tablet TAKE 1 TABLET BY MOUTH EVERY DAY 90 tablet 0  . SPIRIVA HANDIHALER 18 MCG inhalation capsule INHALE 1 CAPSULE VIA HANDIHALER ONCE DAILY AT THE SAME TIME EVERY DAY 30 capsule 5  . spironolactone (ALDACTONE) 25 MG tablet TAKE 1 TABLET BY MOUTH EVERY DAY 90 tablet 3   No current facility-administered medications on file prior to visit.        ROS:  All others reviewed and negative.  Objective        PE:  BP 118/64   Pulse (!) 58   Ht 6\' 3"  (1.905 m)   Wt 275 lb (124.7 kg)   SpO2 97%   BMI 34.37 kg/m                 Constitutional: Pt appears in NAD               HENT: Head: NCAT.                Right Ear: External ear normal.                 Left Ear: External ear normal.                Eyes: . Pupils are equal, round, and reactive to light. Conjunctivae and EOM are normal               Nose: without d/c or deformity               Neck: Neck supple. Gross normal ROM               Cardiovascular: Normal rate and regular rhythm.                 Pulmonary/Chest: Effort normal and breath sounds without rales or wheezing.                Abd:  Soft, NT, ND, + BS, no organomegaly               Neurological: Pt is alert. At baseline orientation, motor grossly intact               Skin: Skin is warm. No rashes, no other new  lesions, LE edema - none               Psychiatric: Pt behavior is normal without agitation   Micro: none  Cardiac tracings I have personally interpreted today:  none  Pertinent Radiological findings (summarize): none   Lab Results  Component Value Date   WBC 4.2 01/22/2021   HGB 14.9 01/22/2021   HCT 44.6  01/22/2021   PLT 171.0 01/22/2021   GLUCOSE 81 01/22/2021   CHOL 99 01/22/2021   TRIG 57.0 01/22/2021   HDL 40.00 01/22/2021   LDLCALC 48 01/22/2021   ALT 15 01/22/2021   AST 21 01/22/2021   NA 138 01/22/2021   K 4.1 01/22/2021   CL 104 01/22/2021   CREATININE 1.10 01/22/2021   BUN 13 01/22/2021   CO2 29 01/22/2021   TSH 1.62 01/22/2021   PSA 1.65 11/09/2017   INR 1.09 01/30/2018   HGBA1C 5.3 01/22/2021   Assessment/Plan:  HERMILO DUTTER is a 80 y.o. Black or African American [2] male with  has a past medical history of Allergy, Anemia, unspecified, Anxiety, Aortic root dilation ([place]), Arthritis, ASTHMA, UNSPECIFIED, UNSPECIFIED STATUS (08/06/2009), Bladder calculus (05/05/2012), Cataract, Chest pain, ECHOCARDIOGRAM, ABNORMAL (12/26/2006), Erectile dysfunction (05/05/2012), GERD (gastroesophageal reflux disease) (05/05/2012), H pylori ulcer, History of back surgery, Hyperlipidemia (05/08/2012), Hypertension, Impaired glucose tolerance (05/05/2012), Lumbar disc disease (05/08/2012), Male stress incontinence (05/05/2012), Myocardial infarction ([place]), Obesity, OBESITY (10/06/2006), Prostate cancer ([place]), PROSTATE CANCER, HX OF (08/06/2009), PUD (peptic ulcer disease) (1/61/0960), Systolic CHF ([place]) (4/54/0981), Thoracic aortic aneurysm ([place]), and UNSPECIFIED OPEN-ANGLE GLAUCOMA (08/06/2009).  COPD (chronic obstructive pulmonary disease) (HCC) Stable, cont current med tx - spiriva  Current Outpatient Medications (Endocrine & Metabolic):  .  dapagliflozin propanediol (FARXIGA) 10 MG TABS tablet, Take 1 tablet (10 mg total) by mouth daily before breakfast.  Current Outpatient Medications (Cardiovascular):  .  carvedilol (COREG) 6.25 MG tablet, TAKE 1 TABLET (6.25 MG TOTAL) BY MOUTH 2 (TWO) TIMES DAILY WITH A MEAL. Marland Kitchen  ENTRESTO 97-103 MG, TAKE 1 TABLET BY MOUTH TWICE A DAY .  rosuvastatin (CRESTOR) 10 MG tablet, TAKE 1 TABLET BY MOUTH EVERY DAY .  spironolactone (ALDACTONE) 25 MG tablet, TAKE 1 TABLET  BY MOUTH EVERY DAY  Current Outpatient Medications (Respiratory):  Marland Kitchen  SPIRIVA HANDIHALER 18 MCG inhalation capsule, INHALE 1 CAPSULE VIA HANDIHALER ONCE DAILY AT THE SAME TIME EVERY DAY  Current Outpatient Medications (Analgesics):  .  aspirin 81 MG EC tablet, TAKE 1 TABLET BY MOUTH EVERY DAY .  meloxicam (MOBIC) 7.5 MG tablet, TAKE 1 TABLET BY MOUTH EVERY DAY   Current Outpatient Medications (Other):  .  dorzolamide-timolol (COSOPT) 22.3-6.8 MG/ML ophthalmic solution, Place 1 drop into both eyes 2 (two) times daily. Marland Kitchen  latanoprost (XALATAN) 0.005 % ophthalmic solution, Place 1 drop into both eyes at bedtime.    Essential hypertension BP Readings from Last 3 Encounters:  01/22/21 118/64  12/03/20 (!) 100/58  05/28/20 130/60   Stable, pt to continue medical treatment  - coreg entresto aldactone   Hyperlipidemia Lab Results  Component Value Date   LDLCALC 48 01/22/2021   Stable, pt to continue current statin crestor 10   Impaired glucose tolerance Lab Results  Component Value Date   HGBA1C 5.3 01/22/2021   Stable, pt to continue current medical treatment farxiga   Followup: Return in about 6 months (around 07/25/2021).  Cathlean Cower, Benton 01/26/2021 5:26 PM [facility]

## 2021-01-22 NOTE — Patient Instructions (Signed)

## 2021-01-26 ENCOUNTER — Encounter: Payer: Self-pay | Admitting: Internal Medicine

## 2021-01-26 NOTE — Assessment & Plan Note (Signed)
Lab Results  Component Value Date   HGBA1C 5.3 01/22/2021   Stable, pt to continue current medical treatment farxiga

## 2021-01-26 NOTE — Assessment & Plan Note (Signed)
Stable, cont current med tx - spiriva  Current Outpatient Medications (Endocrine & Metabolic):  .  dapagliflozin propanediol (FARXIGA) 10 MG TABS tablet, Take 1 tablet (10 mg total) by mouth daily before breakfast.  Current Outpatient Medications (Cardiovascular):  .  carvedilol (COREG) 6.25 MG tablet, TAKE 1 TABLET (6.25 MG TOTAL) BY MOUTH 2 (TWO) TIMES DAILY WITH A MEAL. Marland Kitchen  ENTRESTO 97-103 MG, TAKE 1 TABLET BY MOUTH TWICE A DAY .  rosuvastatin (CRESTOR) 10 MG tablet, TAKE 1 TABLET BY MOUTH EVERY DAY .  spironolactone (ALDACTONE) 25 MG tablet, TAKE 1 TABLET BY MOUTH EVERY DAY  Current Outpatient Medications (Respiratory):  Marland Kitchen  SPIRIVA HANDIHALER 18 MCG inhalation capsule, INHALE 1 CAPSULE VIA HANDIHALER ONCE DAILY AT THE SAME TIME EVERY DAY  Current Outpatient Medications (Analgesics):  .  aspirin 81 MG EC tablet, TAKE 1 TABLET BY MOUTH EVERY DAY .  meloxicam (MOBIC) 7.5 MG tablet, TAKE 1 TABLET BY MOUTH EVERY DAY   Current Outpatient Medications (Other):  .  dorzolamide-timolol (COSOPT) 22.3-6.8 MG/ML ophthalmic solution, Place 1 drop into both eyes 2 (two) times daily. Marland Kitchen  latanoprost (XALATAN) 0.005 % ophthalmic solution, Place 1 drop into both eyes at bedtime.

## 2021-01-26 NOTE — Assessment & Plan Note (Signed)
Lab Results  Component Value Date   LDLCALC 48 01/22/2021   Stable, pt to continue current statin crestor 10

## 2021-01-26 NOTE — Assessment & Plan Note (Signed)
BP Readings from Last 3 Encounters:  01/22/21 118/64  12/03/20 (!) 100/58  05/28/20 130/60   Stable, pt to continue medical treatment  - coreg entresto aldactone

## 2021-02-09 DIAGNOSIS — H401133 Primary open-angle glaucoma, bilateral, severe stage: Secondary | ICD-10-CM | POA: Diagnosis not present

## 2021-02-20 ENCOUNTER — Other Ambulatory Visit: Payer: Self-pay | Admitting: Internal Medicine

## 2021-02-20 NOTE — Telephone Encounter (Signed)
Please refill as per office routine med refill policy (all routine meds refilled for 3 mo or monthly per pt preference up to one year from last visit, then month to month grace period for 3 mo, then further med refills will have to be denied)  

## 2021-03-25 ENCOUNTER — Other Ambulatory Visit: Payer: Self-pay | Admitting: Internal Medicine

## 2021-03-25 NOTE — Telephone Encounter (Signed)
Please refill as per office routine med refill policy (all routine meds refilled for 3 mo or monthly per pt preference up to one year from last visit, then month to month grace period for 3 mo, then further med refills will have to be denied)  

## 2021-03-26 ENCOUNTER — Encounter (HOSPITAL_COMMUNITY): Payer: Self-pay | Admitting: Cardiology

## 2021-03-26 ENCOUNTER — Other Ambulatory Visit: Payer: Self-pay

## 2021-03-26 ENCOUNTER — Ambulatory Visit (HOSPITAL_COMMUNITY)
Admission: RE | Admit: 2021-03-26 | Discharge: 2021-03-26 | Disposition: A | Discharge status: Home / Self Care | Payer: Medicare Other | Source: Ambulatory Visit | Attending: Cardiology | Admitting: Cardiology

## 2021-03-26 VITALS — BP 120/70 | HR 62 | Wt 270.2 lb

## 2021-03-26 DIAGNOSIS — Z7901 Long term (current) use of anticoagulants: Secondary | ICD-10-CM | POA: Insufficient documentation

## 2021-03-26 DIAGNOSIS — I110 Hypertensive heart disease with heart failure: Secondary | ICD-10-CM | POA: Insufficient documentation

## 2021-03-26 DIAGNOSIS — Z87891 Personal history of nicotine dependence: Secondary | ICD-10-CM | POA: Diagnosis not present

## 2021-03-26 DIAGNOSIS — E785 Hyperlipidemia, unspecified: Secondary | ICD-10-CM | POA: Insufficient documentation

## 2021-03-26 DIAGNOSIS — Z7984 Long term (current) use of oral hypoglycemic drugs: Secondary | ICD-10-CM | POA: Insufficient documentation

## 2021-03-26 DIAGNOSIS — Z79899 Other long term (current) drug therapy: Secondary | ICD-10-CM | POA: Insufficient documentation

## 2021-03-26 DIAGNOSIS — J449 Chronic obstructive pulmonary disease, unspecified: Secondary | ICD-10-CM | POA: Diagnosis not present

## 2021-03-26 DIAGNOSIS — I428 Other cardiomyopathies: Secondary | ICD-10-CM | POA: Insufficient documentation

## 2021-03-26 DIAGNOSIS — I5022 Chronic systolic (congestive) heart failure: Secondary | ICD-10-CM | POA: Diagnosis not present

## 2021-03-26 DIAGNOSIS — R0600 Dyspnea, unspecified: Secondary | ICD-10-CM | POA: Diagnosis not present

## 2021-03-26 DIAGNOSIS — I2510 Atherosclerotic heart disease of native coronary artery without angina pectoris: Secondary | ICD-10-CM | POA: Diagnosis not present

## 2021-03-26 DIAGNOSIS — Z7982 Long term (current) use of aspirin: Secondary | ICD-10-CM | POA: Insufficient documentation

## 2021-03-26 LAB — BASIC METABOLIC PANEL
Anion gap: 5 (ref 5–15)
BUN: 11 mg/dL (ref 8–23)
CO2: 26 mmol/L (ref 22–32)
Calcium: 9.1 mg/dL (ref 8.9–10.3)
Chloride: 108 mmol/L (ref 98–111)
Creatinine, Ser: 1.08 mg/dL (ref 0.61–1.24)
GFR, Estimated: >60 mL/min (ref >60)
Glucose, Bld: 103 mg/dL — ABNORMAL HIGH (ref 70–99)
Potassium: 4.2 mmol/L (ref 3.5–5.1)
Sodium: 139 mmol/L (ref 135–145)

## 2021-03-26 NOTE — Patient Instructions (Signed)
Labs done today. We will contact you only if your labs are abnormal.  No medication changes were made. Please continue all current medications as prescribed.  Your physician recommends that you schedule a follow-up appointment in: 4 months.   If you have any questions or concerns before your next appointment please send us a message through mychart or call our office at [phone].    TO LEAVE A MESSAGE FOR THE NURSE SELECT OPTION 2, PLEASE LEAVE A MESSAGE INCLUDING: . YOUR NAME . DATE OF BIRTH . CALL BACK NUMBER . REASON FOR CALL**this is important as we prioritize the call backs  YOU WILL RECEIVE A CALL BACK THE SAME DAY AS LONG AS YOU CALL BEFORE 4:00 PM   Do the following things EVERYDAY: 1) Weigh yourself in the morning before breakfast. Write it down and keep it in a log. 2) Take your medicines as prescribed 3) Eat low salt foods--Limit salt (sodium) to 2000 mg per day.  4) Stay as active as you can everyday 5) Limit all fluids for the day to less than 2 liters   At the [facility], you and your health needs are our priority. As part of our continuing mission to provide you with exceptional heart care, we have created designated Provider Care Teams. These Care Teams include your primary Cardiologist (physician) and Advanced Practice Providers (APPs- Physician Assistants and Nurse Practitioners) who all work together to provide you with the care you need, when you need it.   You may see any of the following providers on your designated Care Team at your next follow up: . Dr Daniel Bensimhon . Dr Dalton McLean . Amy Clegg, NP . Brittainy Simmons, PA . Lauren Kemp, PharmD   Please be sure to bring in all your medications bottles to every appointment.   

## 2021-03-27 NOTE — Progress Notes (Signed)
ID:  Dylan Benton, DOB 29-Apr-1941, MRN [id number]   Provider location: [facility] Type of Visit: Established patient   PCP:  Biagio Borg, MD  Cardiologist:  Quay Burow, MD Primary HF: Dr. Aundra Dubin   History of Present Illness: Dylan Benton is a 80 y.o. male who has a history of HTN and cardiomyopathy of uncertain etiology.  He was referred by Dr. Gwenlyn Found for evaluation of dyspnea and CHF.  Patient was first found to have decreased systolic function in 7/41 when echo showed EF 45-50%. At that time, he had mild exertional dyspnea. Last summer, he had gotten to the point where he had to take frequent breaks mowing the lawn.  However, since the beginning of this year, he has had worsening dyspnea.  Echo was done in 1/19, showing EF down to 35-40%.  Cardiolite did not show a definite perfusion defect. 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 Cardiac MRI in 4/19 showed EF 46%, LGE pattern that was concerning for myocarditis. PYP scan was negative.   Echo in 7/20 showed  EF 45%, RV normal.  Echo in 9/21 showed EF 45-50%, diffuse hypokinesis with normal RV.   Zio patch x 7 days in 10/21 showed no significant arrhythmia.   He returns for followup of CHF.  Stable symptomatically.  No problems walking to the mailbox.  Able to edge and weed-eat with mild dyspnea.  No lightheadedness.  No chest pain.  No orthopnea/PND.  Weight is down 14 lbs.   Labs (12/18): LDL 47 Labs (3/19): K 4.3, creatinine 1.09 Labs (4/19): Myeloma pattern negative Labs (5/19): K 4.6, creatinine 1.06 Labs (6/19): LDL 48, HDl 42, K 4, creatinine 1.15 Labs (6/20): LDL 51, HDl 37, K 4.4, creatinine 1.12, LFTs normal, TSH normal Labs (12/20): LDL 64, HDL 43 Labs (3/21): K 4.1, creatinine 0.98 Labs (6/21): K 4.1, creatinine 1.17 Labs (9/21): K 4.2, creatinine 1.0 Labs (3/22): K 4.1, creatinine 1.1, LDL 48  PMH: 1. Low back pain 2. Cardiomyopathy: LHC in 2006 with normal coronaries.  Echo in  1/17 with EF 45-50%.  - Echo (1/19): EF 35-40%, mild LVH - Cardiolite (2/19): EF 41%, no ischemia/infarction.  - LHC (3/19): 50% ostial LAD, 50% ramus. LVEDP 12.  - Cardiac MRI (4/19): EF 46%, normal RV size and systolic function, LGE pattern suggestive of prior myocarditis.  - PYP scan (4/19): Negative, not suggestive of TTR amyloidosis. - Myeloma panel negative - Echo (7/20): EF 45%, RV normal, IVC normal - Echo (9/21): EF 45-50%, diffuse hypokinesis, normal RV, IVC normal.  3. HTN 4. Hyperlipidemia 5. CAD: LHC (3/19) with 50% ostial LAD, 50% ramus. 6. COPD: Prior smoker.  PFTs 11/19 with moderate obstruction.  7. Vasovagal syncope/presyncope.  8. PAD: peripheral arterial dopplers (3/21) showed occluded right AT artery, 30-49% left SFA.  9. Zio patch (10/21): x 7 days, no significant arrhythmias.   Social History   Socioeconomic History  . Marital status: Married    Spouse name: Not on file  . Number of children: 2  . Years of education: 62  . Highest education level: Not on file  Occupational History  . Occupation: Camera operator (Part-time)  Tobacco Use  . Smoking status: Former Smoker    Quit date: 12/02/1988    Years since quitting: 32.3  . Smokeless tobacco: Never Used  Substance and Sexual Activity  . Alcohol use: No  . Drug use: No  . Sexual activity: Not on file  Other Topics Concern  . Not on file  Social History Narrative   Works for [employer] at [employer] in [place].   Social Determinants of Health   Financial Resource Strain: Low Risk   . Difficulty of Paying Living Expenses: Not hard at all  Food Insecurity: No Food Insecurity  . Worried About Charity fundraiser in the Last Year: Never true  . Ran Out of Food in the Last Year: Never true  Transportation Needs: No Transportation Needs  . Lack of Transportation (Medical): No  . Lack of Transportation (Non-Medical): No  Physical Activity: Insufficiently Active  . Days of Exercise per Week: 3 days  .  Minutes of Exercise per Session: 30 min  Stress: No Stress Concern Present  . Feeling of Stress : Not at all  Social Connections: Moderately Isolated  . Frequency of Communication with Friends and Family: More than three times a week  . Frequency of Social Gatherings with Friends and Family: More than three times a week  . Attends Religious Services: Never  . Active Member of Clubs or Organizations: No  . Attends Archivist Meetings: Never  . Marital Status: Married  Human resources officer Violence: Not on file   Family History  Problem Relation Age of Onset  . Coronary artery disease Other   . Hypertension Other   . Stroke Other   . Colon cancer Neg Hx   . Esophageal cancer Neg Hx   . Rectal cancer Neg Hx   . Stomach cancer Neg Hx    ROS: All systems reviewed and negative except as per HPI.  Current Outpatient Medications  Medication Sig Dispense Refill  . aspirin 81 MG EC tablet TAKE 1 TABLET BY MOUTH EVERY DAY 90 tablet 3  . carvedilol (COREG) 6.25 MG tablet TAKE 1 TABLET (6.25 MG TOTAL) BY MOUTH 2 (TWO) TIMES DAILY WITH A MEAL. 180 tablet 1  . dapagliflozin propanediol (FARXIGA) 10 MG TABS tablet Take 1 tablet (10 mg total) by mouth daily before breakfast. 30 tablet 6  . dorzolamide-timolol (COSOPT) 22.3-6.8 MG/ML ophthalmic solution Place 1 drop into both eyes 2 (two) times daily.    Marland Kitchen ENTRESTO 97-103 MG TAKE 1 TABLET BY MOUTH TWICE A DAY 180 tablet 3  . latanoprost (XALATAN) 0.005 % ophthalmic solution Place 1 drop into both eyes at bedtime.     . rosuvastatin (CRESTOR) 10 MG tablet TAKE 1 TABLET BY MOUTH EVERY DAY 90 tablet 0  . SPIRIVA HANDIHALER 18 MCG inhalation capsule INHALE 1 CAPSULE VIA HANDIHALER ONCE DAILY AT THE SAME TIME EVERY DAY 30 capsule 5  . spironolactone (ALDACTONE) 25 MG tablet TAKE 1 TABLET BY MOUTH EVERY DAY 90 tablet 3   No current facility-administered medications for this encounter.   BP 120/70   Pulse 62   Wt 122.6 kg (270 lb 3.2 oz)   SpO2  97%   BMI 33.77 kg/m  General: NAD Neck: No JVD, no thyromegaly or thyroid nodule.  Lungs: Clear to auscultation bilaterally with normal respiratory effort. CV: Nondisplaced PMI.  Heart regular S1/S2, no S3/S4, no murmur.  No peripheral edema.  No carotid bruit.  Normal pedal pulses.  Abdomen: Soft, nontender, no hepatosplenomegaly, no distention.  Skin: Intact without lesions or rashes.  Neurologic: Alert and oriented x 3.  Psych: Normal affect. Extremities: No clubbing or cyanosis.  HEENT: Normal.    Assessment/Plan: 1. Chronic systolic CHF: Echo in 4/01 with EF 35-40%, mild LVH. LHC in 3/19 with nonobstructive CAD.  Cardiac  MRI in 4/19 showed EF 46%, LGE pattern concerning for prior myocarditis. PYP scan was negative, not suggestive of TTR amyloidosis. Myeloma panel negative.  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 Echo in 7/20 showed EF remains 45%, echo in 9/21 with EF 45-50%.  NYHA class II symptoms, stable. He is not volume overloaded on exam.  - Continue Coreg 6.25 mg bid.  With h/o bradycardia, will not increase dose.  - Continue Jardiance 10 mg daily.  - Continue Entresto 97/103 mg bid. - He takes Lasix prn.  - Continue spironolactone 25 mg daily, BMET today.  2. HTN: BP not elevated.  3. Hyperlipidemia.  Continue Crestor, good lipids in 3/22.   4. COPD: Moderate by PFTs.  5. Suspect OSA: Daytime sleepiness/fatigue.  - He needs to reschedule sleep study.   6. PAD: Occluded right AT.  However, he does not seem to have significant claudication.  - Continue statin, ASA.   Followup in 4 months.   Signed, Loralie Champagne, MD  03/27/2021  [facility] [address] [facility] [place] [phone] (office) [phone] (fax)

## 2021-04-04 ENCOUNTER — Other Ambulatory Visit (HOSPITAL_COMMUNITY): Payer: Self-pay | Admitting: Cardiology

## 2021-05-06 ENCOUNTER — Other Ambulatory Visit (HOSPITAL_COMMUNITY): Payer: Self-pay | Admitting: *Deleted

## 2021-05-06 MED ORDER — DAPAGLIFLOZIN PROPANEDIOL 10 MG PO TABS: 10.0000 mg | ORAL_TABLET | Freq: Every day | ORAL | 3 refills | Status: DC

## 2021-05-08 DIAGNOSIS — C61 Malignant neoplasm of prostate: Secondary | ICD-10-CM | POA: Diagnosis not present

## 2021-05-15 DIAGNOSIS — N3946 Mixed incontinence: Secondary | ICD-10-CM | POA: Diagnosis not present

## 2021-05-15 DIAGNOSIS — C61 Malignant neoplasm of prostate: Secondary | ICD-10-CM | POA: Diagnosis not present

## 2021-05-19 ENCOUNTER — Other Ambulatory Visit (HOSPITAL_COMMUNITY): Payer: Self-pay | Admitting: Cardiology

## 2021-05-22 ENCOUNTER — Other Ambulatory Visit: Payer: Self-pay | Admitting: Internal Medicine

## 2021-05-22 NOTE — Telephone Encounter (Signed)
Please refill as per office routine med refill policy (all routine meds refilled for 3 mo or monthly per pt preference up to one year from last visit, then month to month grace period for 3 mo, then further med refills will have to be denied)  

## 2021-06-17 DIAGNOSIS — H401133 Primary open-angle glaucoma, bilateral, severe stage: Secondary | ICD-10-CM | POA: Diagnosis not present

## 2021-07-05 ENCOUNTER — Other Ambulatory Visit (HOSPITAL_COMMUNITY): Payer: Self-pay | Admitting: Cardiology

## 2021-07-14 ENCOUNTER — Telehealth: Payer: Self-pay | Admitting: Gastroenterology

## 2021-07-14 NOTE — Telephone Encounter (Signed)
Patient called states he is having issues with bowel movements and is seeking advise.

## 2021-07-14 NOTE — Telephone Encounter (Signed)
Left message for patient to call back  

## 2021-07-15 NOTE — Telephone Encounter (Signed)
Left message on machine to call back  

## 2021-07-16 NOTE — Telephone Encounter (Signed)
Patient returned call

## 2021-07-16 NOTE — Telephone Encounter (Signed)
Several messages have been left for the pt to return call.  Will await further communication from the pt.

## 2021-07-16 NOTE — Telephone Encounter (Signed)
Spoke with the pt and he has no current questions or problems to discuss. He has been scheduled for office visit and will discuss at that time.

## 2021-07-28 ENCOUNTER — Ambulatory Visit: Payer: Medicare Other | Admitting: Internal Medicine

## 2021-07-29 ENCOUNTER — Ambulatory Visit (HOSPITAL_COMMUNITY)
Admission: RE | Admit: 2021-07-29 | Discharge: 2021-07-29 | Disposition: A | Discharge status: Home / Self Care | Payer: Medicare Other | Source: Ambulatory Visit | Attending: Cardiology | Admitting: Cardiology

## 2021-07-29 ENCOUNTER — Other Ambulatory Visit: Payer: Self-pay

## 2021-07-29 ENCOUNTER — Encounter (HOSPITAL_COMMUNITY): Payer: Self-pay | Admitting: Cardiology

## 2021-07-29 VITALS — BP 118/70 | HR 60 | Wt 266.4 lb

## 2021-07-29 DIAGNOSIS — I110 Hypertensive heart disease with heart failure: Secondary | ICD-10-CM | POA: Diagnosis not present

## 2021-07-29 DIAGNOSIS — Z7984 Long term (current) use of oral hypoglycemic drugs: Secondary | ICD-10-CM | POA: Insufficient documentation

## 2021-07-29 DIAGNOSIS — J449 Chronic obstructive pulmonary disease, unspecified: Secondary | ICD-10-CM | POA: Insufficient documentation

## 2021-07-29 DIAGNOSIS — I739 Peripheral vascular disease, unspecified: Secondary | ICD-10-CM | POA: Diagnosis not present

## 2021-07-29 DIAGNOSIS — I5022 Chronic systolic (congestive) heart failure: Secondary | ICD-10-CM | POA: Insufficient documentation

## 2021-07-29 DIAGNOSIS — Z8249 Family history of ischemic heart disease and other diseases of the circulatory system: Secondary | ICD-10-CM | POA: Diagnosis not present

## 2021-07-29 DIAGNOSIS — G629 Polyneuropathy, unspecified: Secondary | ICD-10-CM | POA: Diagnosis not present

## 2021-07-29 DIAGNOSIS — Z79899 Other long term (current) drug therapy: Secondary | ICD-10-CM | POA: Diagnosis not present

## 2021-07-29 DIAGNOSIS — I428 Other cardiomyopathies: Secondary | ICD-10-CM | POA: Diagnosis not present

## 2021-07-29 DIAGNOSIS — Z87891 Personal history of nicotine dependence: Secondary | ICD-10-CM | POA: Diagnosis not present

## 2021-07-29 DIAGNOSIS — Z7982 Long term (current) use of aspirin: Secondary | ICD-10-CM | POA: Insufficient documentation

## 2021-07-29 DIAGNOSIS — E785 Hyperlipidemia, unspecified: Secondary | ICD-10-CM | POA: Insufficient documentation

## 2021-07-29 DIAGNOSIS — I2510 Atherosclerotic heart disease of native coronary artery without angina pectoris: Secondary | ICD-10-CM | POA: Insufficient documentation

## 2021-07-29 LAB — BASIC METABOLIC PANEL
Anion gap: 6 (ref 5–15)
BUN: 10 mg/dL (ref 8–23)
CO2: 28 mmol/L (ref 22–32)
Calcium: 9.6 mg/dL (ref 8.9–10.3)
Chloride: 103 mmol/L (ref 98–111)
Creatinine, Ser: 1.1 mg/dL (ref 0.61–1.24)
GFR, Estimated: >60 mL/min (ref >60)
Glucose, Bld: 109 mg/dL — ABNORMAL HIGH (ref 70–99)
Potassium: 4.6 mmol/L (ref 3.5–5.1)
Sodium: 137 mmol/L (ref 135–145)

## 2021-07-29 NOTE — Patient Instructions (Signed)
Labs done today, your results will be available in MyChart, we will contact you for abnormal readings.  Your physician has requested that you have an echocardiogram. Echocardiography is a painless test that uses sound waves to create images of your heart. It provides your doctor with information about the size and shape of your heart and how well your heart's chambers and valves are working. This procedure takes approximately one hour. There are no restrictions for this procedure.  Your physician has requested that you have a lower extremity arterial exercise duplex. During this test, exercise and ultrasound are used to evaluate arterial blood flow in the legs. Allow one hour for this exam. There are no restrictions or special instructions. This will be done at [facility], they will call you to schedule this  Please call our office in January 2023 to schedule your follow up appointment  If you have any questions or concerns before your next appointment please send [place] a message through [place] or call our office at [phone].    TO LEAVE A MESSAGE FOR THE NURSE SELECT OPTION 2, PLEASE LEAVE A MESSAGE INCLUDING: YOUR NAME DATE OF BIRTH CALL BACK NUMBER REASON FOR CALL**this is important as we prioritize the call backs  YOU WILL RECEIVE A CALL BACK THE SAME DAY AS LONG AS YOU CALL BEFORE 4:00 PM  At the [facility], you and your health needs are our priority. As part of our continuing mission to provide you with exceptional heart care, we have created designated Provider Care Teams. These Care Teams include your primary Cardiologist (physician) and Advanced Practice Providers (APPs- Physician Assistants and Nurse Practitioners) who all work together to provide you with the care you need, when you need it.   You may see any of the following providers on your designated Care Team at your next follow up: Dr Glori Bickers Dr Loralie Champagne Dr Patrice Paradise,  NP Lyda Jester, [place] Ginnie Smart Audry Riles, PharmD   Please be sure to bring in all your medications bottles to every appointment.

## 2021-07-30 NOTE — Progress Notes (Signed)
ID:  Dylan Benton, DOB 10-13-1941, MRN [id number]   Provider location: [facility] Type of Visit: Established patient   PCP:  Biagio Borg, MD  Cardiologist:  Quay Burow, MD Primary HF: Dr. Aundra Dubin   History of Present Illness: Dylan Benton is a 80 y.o. male who has a history of HTN and cardiomyopathy of uncertain etiology.  He was referred by Dr. Gwenlyn Found for evaluation of dyspnea and CHF.  Patient was first found to have decreased systolic function in 0000000 when echo showed EF 45-50%. At that time, he had mild exertional dyspnea. Last summer, he had gotten to the point where he had to take frequent breaks mowing the lawn.  However, since the beginning of this year, he has had worsening dyspnea.  Echo was done in 1/19, showing EF down to 35-40%.  Cardiolite did not show a definite perfusion defect. 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 Cardiac MRI in 4/19 showed EF 46%, LGE pattern that was concerning for myocarditis. PYP scan was negative.   Echo in 7/20 showed  EF 45%, RV normal.  Echo in 9/21 showed EF 45-50%, diffuse hypokinesis with normal RV.   Zio patch x 7 days in 10/21 showed no significant arrhythmia.   He returns for followup of CHF.  Weight is down 4 lbs.  He is trying to get some exercise, walking 1/2-2 miles/day.  He has peripheral neuropathy and is having some trouble with balance, no falls or lightheadedness.  Walks with cane.  He mows his grass.  No claudication or pedal ulcerations.  Main complaint is low back pain.   ECG (personally reviewed): Sinus bradycardia at 56 bpm.   Labs (12/18): LDL 47 Labs (3/19): K 4.3, creatinine 1.09 Labs (4/19): Myeloma pattern negative Labs (5/19): K 4.6, creatinine 1.06 Labs (6/19): LDL 48, HDl 42, K 4, creatinine 1.15 Labs (6/20): LDL 51, HDl 37, K 4.4, creatinine 1.12, LFTs normal, TSH normal Labs (12/20): LDL 64, HDL 43 Labs (3/21): K 4.1, creatinine 0.98 Labs (6/21): K 4.1, creatinine 1.17 Labs  (9/21): K 4.2, creatinine 1.0 Labs (3/22): K 4.1, creatinine 1.1, LDL 48, B12 normal Labs (5/22): K 4.2, creatinine 1.08  PMH: 1. Low back pain 2. Cardiomyopathy: LHC in 2006 with normal coronaries.  Echo in 1/17 with EF 45-50%.  - Echo (1/19): EF 35-40%, mild LVH - Cardiolite (2/19): EF 41%, no ischemia/infarction.  - LHC (3/19): 50% ostial LAD, 50% ramus. LVEDP 12.  - Cardiac MRI (4/19): EF 46%, normal RV size and systolic function, LGE pattern suggestive of prior myocarditis.  - PYP scan (4/19): Negative, not suggestive of TTR amyloidosis. - Myeloma panel negative - Echo (7/20): EF 45%, RV normal, IVC normal - Echo (9/21): EF 45-50%, diffuse hypokinesis, normal RV, IVC normal.  3. HTN 4. Hyperlipidemia 5. CAD: LHC (3/19) with 50% ostial LAD, 50% ramus. 6. COPD: Prior smoker.  PFTs 11/19 with moderate obstruction.  7. Vasovagal syncope/presyncope.  8. PAD: peripheral arterial dopplers (3/21) showed occluded right AT artery, 30-49% left SFA.  9. Zio patch (10/21): x 7 days, no significant arrhythmias.  10. Peripheral neuropathy  Social History   Socioeconomic History   Marital status: Married    Spouse name: Not on file   Number of children: 2   Years of education: 14   Highest education level: Not on file  Occupational History   Occupation: Camera operator (Part-time)  Tobacco Use   Smoking status: Former  Types: Cigarettes    Quit date: 12/02/1988    Years since quitting: 32.6   Smokeless tobacco: Never  Substance and Sexual Activity   Alcohol use: No   Drug use: No   Sexual activity: Not on file  Other Topics Concern   Not on file  Social History Narrative   Works for [employer] at depot in [place].   Social Determinants of Health   Financial Resource Strain: Not on file  Food Insecurity: Not on file  Transportation Needs: Not on file  Physical Activity: Not on file  Stress: Not on file  Social Connections: Not on file  Intimate Partner Violence: Not on  file   Family History  Problem Relation Age of Onset   Coronary artery disease Other    Hypertension Other    Stroke Other    Colon cancer Neg Hx    Esophageal cancer Neg Hx    Rectal cancer Neg Hx    Stomach cancer Neg Hx    ROS: All systems reviewed and negative except as per HPI.  Current Outpatient Medications  Medication Sig Dispense Refill   aspirin 81 MG EC tablet TAKE 1 TABLET BY MOUTH EVERY DAY 90 tablet 3   carvedilol (COREG) 6.25 MG tablet TAKE 1 TABLET BY MOUTH 2 TIMES DAILY WITH A MEAL. 180 tablet 3   dapagliflozin propanediol (FARXIGA) 10 MG TABS tablet Take 1 tablet (10 mg total) by mouth daily before breakfast. D/c 30 day supply script 90 tablet 3   dorzolamide-timolol (COSOPT) 22.3-6.8 MG/ML ophthalmic solution Place 1 drop into both eyes 2 (two) times daily.     ENTRESTO 97-103 MG TAKE 1 TABLET BY MOUTH TWICE A DAY 180 tablet 3   latanoprost (XALATAN) 0.005 % ophthalmic solution Place 1 drop into both eyes at bedtime.      rosuvastatin (CRESTOR) 10 MG tablet TAKE 1 TABLET BY MOUTH EVERY DAY 90 tablet 3   spironolactone (ALDACTONE) 25 MG tablet TAKE 1 TABLET BY MOUTH EVERY DAY 90 tablet 3   No current facility-administered medications for this encounter.   BP 118/70   Pulse 60   Wt 120.8 kg (266 lb 6.4 oz)   SpO2 98%   BMI 33.30 kg/m  General: NAD Neck: No JVD, no thyromegaly or thyroid nodule.  Lungs: Clear to auscultation bilaterally with normal respiratory effort. CV: Nondisplaced PMI.  Heart regular S1/S2, no S3/S4, no murmur.  No peripheral edema.  No carotid bruit.  Normal pedal pulses.  Abdomen: Soft, nontender, no hepatosplenomegaly, no distention.  Skin: Intact without lesions or rashes.  Neurologic: Alert and oriented x 3.  Psych: Normal affect. Extremities: No clubbing or cyanosis.  HEENT: Normal.   Assessment/Plan: 1. Chronic systolic CHF: Echo in [id number] with EF 35-40%, mild LVH. LHC in 3/19 with nonobstructive CAD.  Cardiac MRI in 4/19 showed  EF 46%, LGE pattern concerning for prior myocarditis. PYP scan was negative, not suggestive of TTR amyloidosis. Myeloma panel negative.  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 Echo in 7/20 showed EF remains 45%, echo in 9/21 with EF 45-50%.  NYHA class II symptoms, stable. He is not volume overloaded on exam.  - Continue Coreg 6.25 mg bid.  With h/o bradycardia, will not increase dose.  - Continue Jardiance 10 mg daily.  - Continue Entresto 97/103 mg bid. - He takes Lasix prn.  - Continue spironolactone 25 mg daily, BMET today.  - I will arrange for repeat echo.  2. HTN: BP not elevated.  3. Hyperlipidemia.  Continue Crestor, good lipids in 3/22.   4. COPD: Moderate by PFTs.  5. PAD: Occluded right AT.  However, he does not seem to have significant claudication.  - Continue statin, ASA.  - Due for repeat peripheral arterial doppler evaluation.   Followup in 6 months.   Signed, Loralie Champagne, MD  07/30/2021  [facility] [address] [facility] [place] [address] [phone] (office) [phone] (fax)

## 2021-08-03 ENCOUNTER — Other Ambulatory Visit (HOSPITAL_COMMUNITY): Payer: Self-pay | Admitting: Cardiology

## 2021-08-03 DIAGNOSIS — I739 Peripheral vascular disease, unspecified: Secondary | ICD-10-CM

## 2021-08-11 ENCOUNTER — Other Ambulatory Visit: Payer: Self-pay

## 2021-08-11 ENCOUNTER — Ambulatory Visit (HOSPITAL_COMMUNITY)
Admission: RE | Admit: 2021-08-11 | Discharge: 2021-08-11 | Disposition: A | Discharge status: Home / Self Care | Payer: Medicare Other | Source: Ambulatory Visit | Attending: Cardiology | Admitting: Cardiology

## 2021-08-11 DIAGNOSIS — I739 Peripheral vascular disease, unspecified: Secondary | ICD-10-CM | POA: Diagnosis not present

## 2021-08-18 ENCOUNTER — Ambulatory Visit (HOSPITAL_COMMUNITY)
Admission: RE | Admit: 2021-08-18 | Discharge: 2021-08-18 | Disposition: A | Discharge status: Home / Self Care | Payer: Medicare Other | Source: Ambulatory Visit | Attending: Internal Medicine | Admitting: Internal Medicine

## 2021-08-18 ENCOUNTER — Other Ambulatory Visit: Payer: Self-pay

## 2021-08-18 DIAGNOSIS — I110 Hypertensive heart disease with heart failure: Secondary | ICD-10-CM | POA: Diagnosis not present

## 2021-08-18 DIAGNOSIS — E785 Hyperlipidemia, unspecified: Secondary | ICD-10-CM | POA: Diagnosis not present

## 2021-08-18 DIAGNOSIS — I359 Nonrheumatic aortic valve disorder, unspecified: Secondary | ICD-10-CM | POA: Insufficient documentation

## 2021-08-18 DIAGNOSIS — J449 Chronic obstructive pulmonary disease, unspecified: Secondary | ICD-10-CM | POA: Diagnosis not present

## 2021-08-18 DIAGNOSIS — I5022 Chronic systolic (congestive) heart failure: Secondary | ICD-10-CM | POA: Diagnosis not present

## 2021-08-18 LAB — ECHOCARDIOGRAM COMPLETE
Area-P 1/2: 4.15 cm2
Calc EF: 58.1 %
S' Lateral: 4.1 cm
Single Plane A2C EF: 62.2 %
Single Plane A4C EF: 54.1 %

## 2021-08-18 NOTE — Progress Notes (Signed)
  Echocardiogram 2D Echocardiogram has been performed.  Dylan Benton 08/18/2021, 4:05 PM

## 2021-08-25 ENCOUNTER — Other Ambulatory Visit: Payer: Self-pay

## 2021-08-25 ENCOUNTER — Encounter: Payer: Self-pay | Admitting: Internal Medicine

## 2021-08-25 ENCOUNTER — Ambulatory Visit (INDEPENDENT_AMBULATORY_CARE_PROVIDER_SITE_OTHER): Payer: Medicare Other | Admitting: Internal Medicine

## 2021-08-25 ENCOUNTER — Telehealth: Payer: Self-pay

## 2021-08-25 VITALS — BP 126/68 | HR 55 | Temp 99.0°F | Ht 75.0 in | Wt 264.0 lb

## 2021-08-25 DIAGNOSIS — I5022 Chronic systolic (congestive) heart failure: Secondary | ICD-10-CM | POA: Diagnosis not present

## 2021-08-25 DIAGNOSIS — J439 Emphysema, unspecified: Secondary | ICD-10-CM | POA: Diagnosis not present

## 2021-08-25 DIAGNOSIS — I10 Essential (primary) hypertension: Secondary | ICD-10-CM

## 2021-08-25 DIAGNOSIS — R7302 Impaired glucose tolerance (oral): Secondary | ICD-10-CM | POA: Diagnosis not present

## 2021-08-25 DIAGNOSIS — J4520 Mild intermittent asthma, uncomplicated: Secondary | ICD-10-CM

## 2021-08-25 DIAGNOSIS — N390 Urinary tract infection, site not specified: Secondary | ICD-10-CM

## 2021-08-25 DIAGNOSIS — E7800 Pure hypercholesterolemia, unspecified: Secondary | ICD-10-CM

## 2021-08-25 MED ORDER — ALBUTEROL SULFATE HFA 108 (90 BASE) MCG/ACT IN AERS: 2.0000 | INHALATION_SPRAY | Freq: Four times a day (QID) | RESPIRATORY_TRACT | 11 refills | Status: DC | PRN

## 2021-08-25 NOTE — Telephone Encounter (Signed)
Ok urine testing ordered  I would suggest going to [facility] lab as no appointment is needed

## 2021-08-25 NOTE — Progress Notes (Signed)
Patient ID: Dylan Benton, male   DOB: [birth year], 80 y.o.   MRN: [id number]        Chief Complaint: follow up HTN, HLD and hyperglycemia , dyspnea       HPI:  Dylan Benton is a 80 y.o. male here with c/o mild worsening dyspnea and doe to 10 ft but Pt denies chest pain, wheezing, orthopnea, PND, increased LE swelling, palpitations, dizziness or syncope.  EF last wk up to 50-55%.  Spiriva stopped per cardiology due to risk of side effects.   Needs different inhaler.  Pt denies polydipsia, polyuria, or new focal neuro s/s.   Pt denies fever, wt loss, night sweats, loss of appetite, or other constitutional symptoms  No other new complaints Wt Readings from Last 3 Encounters:  08/25/21 264 lb (119.7 kg)  07/29/21 266 lb 6.4 oz (120.8 kg)  03/26/21 270 lb 3.2 oz (122.6 kg)   BP Readings from Last 3 Encounters:  08/25/21 126/68  07/29/21 118/70  03/26/21 120/70         Past Medical History:  Diagnosis Date   Allergy    Anemia, unspecified    NOS ?resolved   Anxiety    Aortic root dilation (HCC)    Arthritis    ASTHMA, UNSPECIFIED, UNSPECIFIED STATUS 08/06/2009   Annotation: No exacerbations.  Qualifier: Diagnosis of  By: Kelton Pillar MD, Madhav     Bladder calculus 05/05/2012   Cataract    Chest pain    ECHOCARDIOGRAM, ABNORMAL 12/26/2006    Aortic root dilation  This problem surfaced in 2006 after a cardiology referral for chest pain.  He was seen by Dr. Haroldine Laws in January o6 and cathed on Jan. 18.  Coronaries were nl and EF was 65%.  Echo showed mild aortic root dilation of 54mm. He was started on metoprolol (although I notice that he is no longer on this.  Annual echo to follow aortic root was advised.  Aortic root dimension was unchanged on studies in 2009 and 2010.  Will continue to follow this, perhaps not every year as it seems stable.    Erectile dysfunction 05/05/2012   GERD (gastroesophageal reflux disease) 05/05/2012   H pylori ulcer    teated H pylori   History of back surgery     Hyperlipidemia 05/08/2012   Hypertension    Impaired glucose tolerance 05/05/2012   Lumbar disc disease 05/08/2012   Male stress incontinence 05/05/2012   Myocardial infarction [facility])    pt states, "i've been told I have had two heart attacks   Obesity    OBESITY 10/06/2006   Qualifier: Diagnosis of  By: Stann Mainland MD, Nicole Kindred     Prostate cancer [facility])    Hx of    PROSTATE CANCER, HX OF 08/06/2009   Annotation: prostatectomy and radiotherapy in 1990s,  Dr. 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 Edilia Bo Qualifier: Diagnosis of  By: Kelton Pillar MD, Madhav     Past Surgical History:  Procedure Laterality Date   BACK SURGERY     ear surgury     eye surgury     LEFT HEART CATH AND CORONARY ANGIOGRAPHY N/A 02/06/2018   Procedure: LEFT HEART CATH AND CORONARY ANGIOGRAPHY;  Surgeon: Larey Dresser, MD;  Location: [facility];  Service: Cardiovascular;  Laterality: N/A;   PROSTATECTOMY      reports that he quit smoking about 32 years ago. His smoking use included cigarettes. He has never used smokeless tobacco. He reports that he does not drink alcohol and does not use drugs. family history includes Coronary artery disease in an other family member; Hypertension in an other family member; Stroke in an other family member. No Known Allergies Current Outpatient Medications on File Prior to Visit  Medication Sig Dispense Refill   aspirin 81 MG EC tablet TAKE 1 TABLET BY MOUTH EVERY DAY 90 tablet 3   carvedilol (COREG) 6.25 MG tablet TAKE 1 TABLET BY MOUTH 2 TIMES DAILY WITH A MEAL. 180 tablet 3   dapagliflozin propanediol (FARXIGA) 10 MG TABS tablet Take 1 tablet (10 mg total) by mouth daily before breakfast. D/c 30 day supply script 90 tablet 3   dorzolamide-timolol (COSOPT) 22.3-6.8 MG/ML ophthalmic  solution Place 1 drop into both eyes 2 (two) times daily.     ENTRESTO 97-103 MG TAKE 1 TABLET BY MOUTH TWICE A DAY 180 tablet 3   latanoprost (XALATAN) 0.005 % ophthalmic solution Place 1 drop into both eyes at bedtime.      rosuvastatin (CRESTOR) 10 MG tablet TAKE 1 TABLET BY MOUTH EVERY DAY 90 tablet 3   spironolactone (ALDACTONE) 25 MG tablet TAKE 1 TABLET BY MOUTH EVERY DAY 90 tablet 3   No current facility-administered medications on file prior to visit.        ROS:  All others reviewed and negative.  Objective        PE:  BP 126/68 (BP Location: Right Arm, Patient Position: Sitting, Cuff Size: Large)   Pulse (!) 55   Temp 99 F (37.2 C)   Ht 6\' 3"  (1.905 m)   Wt 264 lb (119.7 kg)   SpO2 98%   BMI 33.00 kg/m                 Constitutional: Pt appears in NAD               HENT: Head: NCAT.                Right Ear: External ear normal.                 Left Ear: External ear normal.                Eyes: . Pupils are equal, round, and reactive to light. Conjunctivae and EOM are normal               Nose: without d/c or deformity               Neck: Neck supple. Gross normal ROM               Cardiovascular: Normal rate and regular rhythm.                 Pulmonary/Chest: Effort normal and breath sounds decreased without rales or wheezing.                Abd:  Soft, NT, ND, + BS, no organomegaly               Neurological: Pt is alert. At baseline orientation, motor grossly intact               Skin: Skin is warm. No rashes, no other new lesions, LE edema - none  Psychiatric: Pt behavior is normal without agitation   Micro: none  Cardiac tracings I have personally interpreted today:  none  Pertinent Radiological findings (summarize): none   Lab Results  Component Value Date   WBC 4.2 01/22/2021   HGB 14.9 01/22/2021   HCT 44.6 01/22/2021   PLT 171.0 01/22/2021   GLUCOSE 109 (H) 07/29/2021   CHOL 99 01/22/2021   TRIG 57.0 01/22/2021   HDL 40.00  01/22/2021   LDLCALC 48 01/22/2021   ALT 15 01/22/2021   AST 21 01/22/2021   NA 137 07/29/2021   K 4.6 07/29/2021   CL 103 07/29/2021   CREATININE 1.10 07/29/2021   BUN 10 07/29/2021   CO2 28 07/29/2021   TSH 1.62 01/22/2021   PSA 1.65 11/09/2017   INR 1.09 01/30/2018   HGBA1C 5.3 01/22/2021   Assessment/Plan:  Dylan Benton is a 80 y.o. Black or African American [2] male with  has a past medical history of Allergy, Anemia, unspecified, Anxiety, Aortic root dilation ([place]), Arthritis, ASTHMA, UNSPECIFIED, UNSPECIFIED STATUS (08/06/2009), Bladder calculus (05/05/2012), Cataract, Chest pain, ECHOCARDIOGRAM, ABNORMAL (12/26/2006), Erectile dysfunction (05/05/2012), GERD (gastroesophageal reflux disease) (05/05/2012), H pylori ulcer, History of back surgery, Hyperlipidemia (05/08/2012), Hypertension, Impaired glucose tolerance (05/05/2012), Lumbar disc disease (05/08/2012), Male stress incontinence (05/05/2012), Myocardial infarction ([place]), Obesity, OBESITY (10/06/2006), Prostate cancer ([place]), PROSTATE CANCER, HX OF (08/06/2009), PUD (peptic ulcer disease) (02/18/761), Systolic CHF ([place]) (2/63/3354), Thoracic aortic aneurysm, and UNSPECIFIED OPEN-ANGLE GLAUCOMA (08/06/2009).  Systolic CHF (HCC) Stable overall, to continue current med tx , coreg, farxiga, aldactone  COPD (chronic obstructive pulmonary disease) ([place]) Mild uncontrolled, for albuterol hfa prn,  to f/u any worsening symptoms or concerns  Essential hypertension BP Readings from Last 3 Encounters:  08/25/21 126/68  07/29/21 118/70  03/26/21 120/70   Stable, pt to continue medical treatment coreg, farxiga, aldactone   Asthma Stable overall, continue albuterol hfa prn  Impaired glucose tolerance Lab Results  Component Value Date   HGBA1C 5.3 01/22/2021   Stable, pt to continue current medical treatment  - diet   Hyperlipidemia Lab Results  Component Value Date   LDLCALC 48 01/22/2021   Stable, pt to continue current statin  crestor 10  Followup: Return in about 6 months (around 02/23/2022).  Cathlean Cower, MD 08/27/2021 10:55 PM [facility]

## 2021-08-25 NOTE — Telephone Encounter (Signed)
Please advise as the pt has called and asked that a Urine lab order be out in as he would like to be checked for a UTI.  **Please call call pt when lab is ordered so he can come in to drop off urine sample. [phone].

## 2021-08-25 NOTE — Patient Instructions (Signed)
Please take all new medication as prescribed - the inhaler as needed  Please continue all other medications as before, and refills have been done if requested.  Please have the pharmacy call with any other refills you may need.  Please continue your efforts at being more active, low cholesterol diet, and weight control.  Please keep your appointments with your specialists as you may have planned  Please make an Appointment to return in 6 months, or sooner if needed

## 2021-08-26 ENCOUNTER — Other Ambulatory Visit (INDEPENDENT_AMBULATORY_CARE_PROVIDER_SITE_OTHER): Payer: Medicare Other

## 2021-08-26 ENCOUNTER — Encounter: Payer: Self-pay | Admitting: Internal Medicine

## 2021-08-26 DIAGNOSIS — N390 Urinary tract infection, site not specified: Secondary | ICD-10-CM | POA: Diagnosis not present

## 2021-08-26 LAB — URINALYSIS, ROUTINE W REFLEX MICROSCOPIC
Bilirubin Urine: NEGATIVE
Hgb urine dipstick: NEGATIVE
Ketones, ur: NEGATIVE
Leukocytes,Ua: NEGATIVE
Nitrite: POSITIVE — AB
RBC / HPF: NONE SEEN (ref 0–?)
Specific Gravity, Urine: 1.01 (ref 1.000–1.030)
Total Protein, Urine: NEGATIVE
Urine Glucose: >=1000 — AB
Urobilinogen, UA: 2 — AB (ref 0.0–1.0)
WBC, UA: NONE SEEN (ref 0–?)
pH: 6 (ref 5.0–8.0)

## 2021-08-26 NOTE — Telephone Encounter (Signed)
Patient notified

## 2021-08-27 ENCOUNTER — Encounter: Payer: Self-pay | Admitting: Internal Medicine

## 2021-08-27 NOTE — Assessment & Plan Note (Signed)
BP Readings from Last 3 Encounters:  08/25/21 126/68  07/29/21 118/70  03/26/21 120/70   Stable, pt to continue medical treatment coreg, farxiga, aldactone

## 2021-08-27 NOTE — Assessment & Plan Note (Signed)
Stable overall, continue albuterol hfa prn

## 2021-08-27 NOTE — Assessment & Plan Note (Signed)
Lab Results  Component Value Date   HGBA1C 5.3 01/22/2021   Stable, pt to continue current medical treatment  - diet

## 2021-08-27 NOTE — Assessment & Plan Note (Signed)
Lab Results  Component Value Date   LDLCALC 48 01/22/2021   Stable, pt to continue current statin crestor 10

## 2021-08-27 NOTE — Assessment & Plan Note (Signed)
Stable overall, to continue current med tx , coreg, farxiga, aldactone

## 2021-08-27 NOTE — Assessment & Plan Note (Signed)
Mild uncontrolled, for albuterol hfa prn,  to f/u any worsening symptoms or concerns

## 2021-08-28 ENCOUNTER — Telehealth: Payer: Self-pay | Admitting: Internal Medicine

## 2021-08-28 ENCOUNTER — Encounter: Payer: Self-pay | Admitting: Gastroenterology

## 2021-08-28 ENCOUNTER — Encounter: Payer: Self-pay | Admitting: Internal Medicine

## 2021-08-28 ENCOUNTER — Ambulatory Visit (INDEPENDENT_AMBULATORY_CARE_PROVIDER_SITE_OTHER): Payer: Medicare Other | Admitting: Gastroenterology

## 2021-08-28 ENCOUNTER — Other Ambulatory Visit: Payer: Self-pay | Admitting: Internal Medicine

## 2021-08-28 VITALS — BP 102/58 | HR 56 | Ht 75.0 in | Wt 262.2 lb

## 2021-08-28 DIAGNOSIS — R194 Change in bowel habit: Secondary | ICD-10-CM

## 2021-08-28 LAB — URINE CULTURE
MICRO NUMBER:: 12464217
SPECIMEN QUALITY:: ADEQUATE

## 2021-08-28 MED ORDER — NA SULFATE-K SULFATE-MG SULF 17.5-3.13-1.6 GM/177ML PO SOLN: 1.0000 | ORAL | 0 refills | Status: DC

## 2021-08-28 MED ORDER — CEPHALEXIN 500 MG PO CAPS: 500.0000 mg | ORAL_CAPSULE | Freq: Three times a day (TID) | ORAL | 0 refills | Status: AC

## 2021-08-28 NOTE — Telephone Encounter (Signed)
No, that sounds about right and these results would not be concerning as long as he was feeling ok without dizziness, weakess or trying to fall down.

## 2021-08-28 NOTE — Telephone Encounter (Signed)
Patient concerned about BP  Reading this morning at gastro visit was 102/58  Says he checked it when he got home & it was 130/66  Wants to know if that is within normal range or if he should be concerned  Please call [phone]

## 2021-08-28 NOTE — Patient Instructions (Addendum)
If you are age 82 or older, your body mass index should be between 23-30. Your Body mass index is 32.78 kg/m. If this is out of the aforementioned range listed, please consider follow up with your Primary Care Provider. _________________________________________________________  The [facility] providers would like to encourage you to use [facility] to communicate with providers for non-urgent requests or questions.  Due to long hold times on the telephone, sending your provider a message by [facility] may be a faster and more efficient way to get a response.  Please allow 48 business hours for a response.  Please remember that this is for non-urgent requests.   You have been scheduled for a colonoscopy. Please follow written instructions given to you at your visit today.  Please pick up your prep supplies at the pharmacy within the next 1-3 days. If you use inhalers (even only as needed), please bring them with you on the day of your procedure.  Due to recent changes in healthcare laws, you may see the results of your imaging and laboratory studies on MyChart before your provider has had a chance to review them.  We understand that in some cases there may be results that are confusing or concerning to you. Not all laboratory results come back in the same time frame and the provider may be waiting for multiple results in order to interpret others.  Please give [place] 48 hours in order for your provider to thoroughly review all the results before contacting the office for clarification of your results.   _ XX_   ORAL DIABETIC MEDICATION INSTRUCTIONS                                                             [place]) The day before your procedure:  Take your diabetic pill as you do normally  The day of your procedure:  Do not take your diabetic pill   We will check your blood sugar levels during the admission process and again in Recovery before discharging you  home  ______________________________________________________________________  Thank you for entrusting me with your care and choosing [facility].  Dr Ardis Hughs

## 2021-08-28 NOTE — Telephone Encounter (Signed)
Spoke to pt on home phone - informed + UTI  - hopefully to start antibx tonight; pt agrees and has no other concerns

## 2021-08-28 NOTE — Progress Notes (Signed)
Review of pertinent gastrointestinal problems: 1.  Adenomatous colon polyps.  Colonoscopy October 2014 found a single 8 mm tubular adenoma.  He also had internal hemorrhoids.  The examination was otherwise normal.   HPI: This is a very pleasant 80 year old man who was referred to me by Biagio Borg, MD  to evaluate change in bowel habits, minor rectal bleeding.    The past several months he has noticed a gradual changing of his bowel habits.  He used to have a BM every single day on a regular basis now he sometimes will have 3 bowel movements in the day and sometimes he will have none.  He has noticed thinner stools.  He has noticed some dark, bloody looking substance along the stools as well.  He has intentionally lost 25 pounds in the past year or so.  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  He has CHF because of that but echocardiogram done just last month showed ejection fraction of 50 to 55%.  He was recently diagnosed with a urinary tract infection, culture is pending.  Colon cancer does not run in his family   Review of systems: Pertinent positive and negative review of systems were noted in the above HPI section. All other review negative.   Past Medical History:  Diagnosis Date   Allergy    Anemia, unspecified    NOS ?resolved   Anxiety    Aortic root dilation (HCC)    Arthritis    ASTHMA, UNSPECIFIED, UNSPECIFIED STATUS 08/06/2009   Annotation: No exacerbations.  Qualifier: Diagnosis of  By: Kelton Pillar MD, Madhav     Bladder calculus 05/05/2012   Cataract    Chest pain    ECHOCARDIOGRAM, ABNORMAL 12/26/2006    Aortic root dilation  This problem surfaced in 2006 after a cardiology referral for chest pain.  He was seen by Dr. Haroldine Laws in January o6 and cathed on Jan. 18.  Coronaries were nl and EF was 65%.  Echo showed mild aortic root dilation of 42mm. He was started on metoprolol (although I notice that he is no longer on this.  Annual echo to follow  aortic root was advised.  Aortic root dimension was unchanged on studies in 2009 and 2010.  Will continue to follow this, perhaps not every year as it seems stable.    Erectile dysfunction 05/05/2012   GERD (gastroesophageal reflux disease) 05/05/2012   H pylori ulcer    teated H pylori   History of back surgery    Hyperlipidemia 05/08/2012   Hypertension    Impaired glucose tolerance 05/05/2012   Lumbar disc disease 05/08/2012   Male stress incontinence 05/05/2012   Myocardial infarction [facility])    pt states, "i've been told I have had two heart attacks   Obesity    OBESITY 10/06/2006   Qualifier: Diagnosis of  By: Stann Mainland MD, Nicole Kindred     Prostate cancer [facility])    Hx of    PROSTATE CANCER, HX OF 08/06/2009   Annotation: prostatectomy and radiotherapy in 1990s,  Dr. 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 Edilia Bo Qualifier: Diagnosis of  By: Kelton Pillar MD, Madhav      Past Surgical History:  Procedure Laterality Date   BACK SURGERY     ear surgury  eye surgury     LEFT HEART CATH AND CORONARY ANGIOGRAPHY N/A 02/06/2018   Procedure: LEFT HEART CATH AND CORONARY ANGIOGRAPHY;  Surgeon: Larey Dresser, MD;  Location: [facility];  Service: Cardiovascular;  Laterality: N/A;   PROSTATECTOMY      Current Outpatient Medications  Medication Sig Dispense Refill   albuterol (VENTOLIN HFA) 108 (90 Base) MCG/ACT inhaler Inhale 2 puffs into the lungs every 6 (six) hours as needed for wheezing or shortness of breath. 8 g 11   aspirin 81 MG EC tablet TAKE 1 TABLET BY MOUTH EVERY DAY 90 tablet 3   carvedilol (COREG) 6.25 MG tablet TAKE 1 TABLET BY MOUTH 2 TIMES DAILY WITH A MEAL. 180 tablet 3   dapagliflozin propanediol (FARXIGA) 10 MG TABS tablet Take 1 tablet (10 mg total) by mouth daily before  breakfast. D/c 30 day supply script 90 tablet 3   dorzolamide-timolol (COSOPT) 22.3-6.8 MG/ML ophthalmic solution Place 1 drop into both eyes 2 (two) times daily.     ENTRESTO 97-103 MG TAKE 1 TABLET BY MOUTH TWICE A DAY 180 tablet 3   latanoprost (XALATAN) 0.005 % ophthalmic solution Place 1 drop into both eyes at bedtime.      rosuvastatin (CRESTOR) 10 MG tablet TAKE 1 TABLET BY MOUTH EVERY DAY 90 tablet 3   spironolactone (ALDACTONE) 25 MG tablet TAKE 1 TABLET BY MOUTH EVERY DAY 90 tablet 3   No current facility-administered medications for this visit.    Allergies as of 08/28/2021   (No Known Allergies)    Family History  Problem Relation Age of Onset   Coronary artery disease Other    Hypertension Other    Stroke Other    Colon cancer Neg Hx    Esophageal cancer Neg Hx    Rectal cancer Neg Hx    Stomach cancer Neg Hx     Social History   Socioeconomic History   Marital status: Married    Spouse name: Not on file   Number of children: 2   Years of education: 14   Highest education level: Not on file  Occupational History   Occupation: Camera operator (Part-time)  Tobacco Use   Smoking status: Former    Types: Cigarettes    Quit date: 12/02/1988    Years since quitting: 32.7   Smokeless tobacco: Never  Substance and Sexual Activity   Alcohol use: No   Drug use: No   Sexual activity: Not on file  Other Topics Concern   Not on file  Social History Narrative   Works for [employer] at [employer] in [place].   Social Determinants of Health   Financial Resource Strain: Not on file  Food Insecurity: Not on file  Transportation Needs: Not on file  Physical Activity: Not on file  Stress: Not on file  Social Connections: Not on file  Intimate Partner Violence: Not on file     Physical Exam: BP (!) 102/58   Pulse (!) 56   Ht 6\' 3"  (1.905 m)   Wt 262 lb 4 oz (119 kg)   SpO2 96%   BMI 32.78 kg/m  Constitutional: generally well-appearing Psychiatric: alert and  oriented x3 Eyes: extraocular movements intact Mouth: oral pharynx moist, no lesions Neck: supple no lymphadenopathy Cardiovascular: heart regular rate and rhythm Lungs: clear to auscultation bilaterally Abdomen: soft, nontender, nondistended, no obvious ascites, no peritoneal signs, normal bowel sounds Extremities: no lower extremity edema bilaterally Skin: no lesions on visible extremities   Assessment and  plan: 80 y.o. male with history of precancerous colon polyp, recent change in bowel habits and probably minor rectal bleeding  I recommended colonoscopy at his soonest convenience, it has been 8 years since his last 1 he has noticed a change in his bowel habits and may be some minor rectal bleeding as well.  He has compensated CHF, recent ejection fraction last month was 50 to 55%.  He is not on blood thinners,.  He ambulates well, sometimes uses a cane when he is out in public.  I see no reason for any further blood tests or imaging studies prior to the colonoscopy.  Please see the "Patient Instructions" section for addition details about the plan.   Owens Loffler, MD [facility] 08/28/2021, 10:21 AM  Cc: Biagio Borg, MD  Total time on date of encounter was 45 minutes (this included time spent preparing to see the patient reviewing records; obtaining and/or reviewing separately obtained history; performing a medically appropriate exam and/or evaluation; counseling and educating the patient and family if present; ordering medications, tests or procedures if applicable; and documenting clinical information in the health record).

## 2021-09-16 ENCOUNTER — Ambulatory Visit (AMBULATORY_SURGERY_CENTER): Payer: Medicare Other | Admitting: Gastroenterology

## 2021-09-16 ENCOUNTER — Encounter: Payer: Self-pay | Admitting: Gastroenterology

## 2021-09-16 ENCOUNTER — Other Ambulatory Visit: Payer: Self-pay

## 2021-09-16 VITALS — BP 145/72 | HR 60 | Temp 97.1°F | Resp 13 | Ht 75.0 in | Wt 262.0 lb

## 2021-09-16 DIAGNOSIS — K648 Other hemorrhoids: Secondary | ICD-10-CM

## 2021-09-16 DIAGNOSIS — R194 Change in bowel habit: Secondary | ICD-10-CM | POA: Diagnosis not present

## 2021-09-16 MED ORDER — SODIUM CHLORIDE 0.9 % IV SOLN
500.0000 mL | Freq: Once | INTRAVENOUS | Status: DC
Start: 2021-09-16 — End: 2021-09-16

## 2021-09-16 NOTE — Op Note (Signed)
[facility] Patient Name: Dylan Benton Procedure Date: 09/16/2021 1:47 PM MRN: [id number] Endoscopist: Milus Banister , MD Age: 80 Referring MD:  Date of Birth: 15-Sep-1941 Gender: Male Account #: [id number] Procedure:                Colonoscopy Indications:              High risk colon cancer surveillance: Personal                            history of colonic polyps; Colonoscopy 2014 single                            subCM adenoma removed; minor change in bowel habits                            recently Medicines:                Monitored Anesthesia Care Procedure:                Pre-Anesthesia Assessment:                           - Prior to the procedure, a History and Physical                            was performed, and patient medications and                            allergies were reviewed. The patient's tolerance of                            previous anesthesia was also reviewed. The risks                            and benefits of the procedure and the sedation                            options and risks were discussed with the patient.                            All questions were answered, and informed consent                            was obtained. Prior Anticoagulants: The patient has                            taken no previous anticoagulant or antiplatelet                            agents. ASA Grade Assessment: II - A patient with                            mild systemic disease. After reviewing the risks  and benefits, the patient was deemed in                            satisfactory condition to undergo the procedure.                           After obtaining informed consent, the colonoscope                            was passed under direct vision. Throughout the                            procedure, the patient's blood pressure, pulse, and                            oxygen saturations were monitored continuously. The                             CF [id number] #[id number] was introduced through the anus                            and advanced to the the cecum, identified by                            appendiceal orifice and ileocecal valve. The                            colonoscopy was performed without difficulty. The                            patient tolerated the procedure well. Prep was                            adequate. Scope In: 1:50:44 PM Scope Out: 2:08:22 PM Scope Withdrawal Time: 0 hours 8 minutes 32 seconds  Total Procedure Duration: 0 hours 17 minutes 38 seconds  Findings:                 External and internal hemorrhoids were found. The                            hemorrhoids were small.                           The exam was otherwise without abnormality on                            direct and retroflexion views. Complications:            No immediate complications. Estimated blood loss:                            None. Estimated Blood Loss:     Estimated blood loss was minimal. Impression:               - External and internal hemorrhoids.                           -  The examination was otherwise normal on direct                            and retroflexion views.                           - No polyps or cancers. Recommendation:           - Patient has a contact number available for                            emergencies. The signs and symptoms of potential                            delayed complications were discussed with the                            patient. Return to normal activities tomorrow.                            Written discharge instructions were provided to the                            patient.                           - Resume previous diet.                           - Continue present medications.                           - No need for future colon cancer screening. Milus Banister, MD 09/16/2021 2:11:55 PM This report has been signed electronically.

## 2021-09-16 NOTE — Progress Notes (Signed)
Pt Drowsy. VSS. To PACU, report to RN. No anesthetic complications noted.  

## 2021-09-16 NOTE — Progress Notes (Signed)
VS taken by [place] 

## 2021-09-16 NOTE — Progress Notes (Signed)
  The recent H&P (dated 08/28/2021) was reviewed, the patient was examined and there is no change in the patients condition since that H&P was completed.   Dylan Benton  09/16/2021, 1:39 PM

## 2021-09-16 NOTE — Patient Instructions (Signed)
Discharge instructions given. °Handout on Hemorrhoids. °Resume previous medications. °YOU HAD AN ENDOSCOPIC PROCEDURE TODAY AT THE [facility]:   Refer to the procedure report that was given to you for any specific questions about what was found during the examination.  If the procedure report does not answer your questions, please call your gastroenterologist to clarify.  If you requested that your care partner not be given the details of your procedure findings, then the procedure report has been included in a sealed envelope for you to review at your convenience later. ° °YOU SHOULD EXPECT: Some feelings of bloating in the abdomen. Passage of more gas than usual.  Walking can help get rid of the air that was put into your GI tract during the procedure and reduce the bloating. If you had a lower endoscopy (such as a colonoscopy or flexible sigmoidoscopy) you may notice spotting of blood in your stool or on the toilet paper. If you underwent a bowel prep for your procedure, you may not have a normal bowel movement for a few days. ° °Please Note:  You might notice some irritation and congestion in your nose or some drainage.  This is from the oxygen used during your procedure.  There is no need for concern and it should clear up in a day or so. ° °SYMPTOMS TO REPORT IMMEDIATELY: ° °Following lower endoscopy (colonoscopy or flexible sigmoidoscopy): ° Excessive amounts of blood in the stool ° Significant tenderness or worsening of abdominal pains ° Swelling of the abdomen that is new, acute ° Fever of 100°F or higher ° ° °For urgent or emergent issues, a gastroenterologist can be reached at any hour by calling [phone]. °Do not use MyChart messaging for urgent concerns.  ° ° °DIET:  We do recommend a small meal at first, but then you may proceed to your regular diet.  Drink plenty of fluids but you should avoid alcoholic beverages for 24 hours. ° °ACTIVITY:  You should plan to take it easy for the  rest of today and you should NOT DRIVE or use heavy machinery until tomorrow (because of the sedation medicines used during the test).   ° °FOLLOW UP: °Our staff will call the number listed on your records 48-72 hours following your procedure to check on you and address any questions or concerns that you may have regarding the information given to you following your procedure. If we do not reach you, we will leave a message.  We will attempt to reach you two times.  During this call, we will ask if you have developed any symptoms of COVID 19. If you develop any symptoms (ie: fever, flu-like symptoms, shortness of breath, cough etc.) before then, please call [phone].  If you test positive for Covid 19 in the 2 weeks post procedure, please call and report this information to us.   ° °If any biopsies were taken you will be contacted by phone or by letter within the next 1-3 weeks.  Please call us at [phone] if you have not heard about the biopsies in 3 weeks.  ° ° °SIGNATURES/CONFIDENTIALITY: °You and/or your care partner have signed paperwork which will be entered into your electronic medical record.  These signatures attest to the fact that that the information above on your After Visit Summary has been reviewed and is understood.  Full responsibility of the confidentiality of this discharge information lies with you and/or your care-partner.  °

## 2021-09-18 ENCOUNTER — Telehealth: Payer: Self-pay

## 2021-09-18 NOTE — Telephone Encounter (Signed)
Second attempt follow up call to pt, no answer.  

## 2021-09-22 ENCOUNTER — Ambulatory Visit: Payer: Medicare Other | Admitting: Gastroenterology

## 2021-10-03 ENCOUNTER — Other Ambulatory Visit: Payer: Self-pay | Admitting: Internal Medicine

## 2021-11-09 ENCOUNTER — Other Ambulatory Visit: Payer: Self-pay | Admitting: Internal Medicine

## 2021-11-12 DIAGNOSIS — H401133 Primary open-angle glaucoma, bilateral, severe stage: Secondary | ICD-10-CM | POA: Diagnosis not present

## 2022-01-15 ENCOUNTER — Telehealth (HOSPITAL_COMMUNITY): Payer: Self-pay | Admitting: Cardiology

## 2022-02-15 ENCOUNTER — Other Ambulatory Visit: Payer: Self-pay

## 2022-02-15 ENCOUNTER — Encounter (HOSPITAL_COMMUNITY): Payer: Medicare Other | Admitting: Cardiology

## 2022-02-15 ENCOUNTER — Ambulatory Visit (HOSPITAL_COMMUNITY)
Admission: RE | Admit: 2022-02-15 | Discharge: 2022-02-15 | Disposition: A | Discharge status: Home / Self Care | Payer: Medicare Other | Source: Ambulatory Visit | Attending: Family Medicine | Admitting: Family Medicine

## 2022-02-15 ENCOUNTER — Encounter (HOSPITAL_COMMUNITY): Payer: Self-pay

## 2022-02-15 VITALS — BP 148/70 | HR 59 | Wt 255.8 lb

## 2022-02-15 DIAGNOSIS — I739 Peripheral vascular disease, unspecified: Secondary | ICD-10-CM

## 2022-02-15 DIAGNOSIS — E785 Hyperlipidemia, unspecified: Secondary | ICD-10-CM | POA: Diagnosis not present

## 2022-02-15 DIAGNOSIS — R001 Bradycardia, unspecified: Secondary | ICD-10-CM | POA: Diagnosis not present

## 2022-02-15 DIAGNOSIS — I110 Hypertensive heart disease with heart failure: Secondary | ICD-10-CM | POA: Insufficient documentation

## 2022-02-15 DIAGNOSIS — Z79899 Other long term (current) drug therapy: Secondary | ICD-10-CM | POA: Insufficient documentation

## 2022-02-15 DIAGNOSIS — I10 Essential (primary) hypertension: Secondary | ICD-10-CM

## 2022-02-15 DIAGNOSIS — I5022 Chronic systolic (congestive) heart failure: Secondary | ICD-10-CM

## 2022-02-15 DIAGNOSIS — J449 Chronic obstructive pulmonary disease, unspecified: Secondary | ICD-10-CM | POA: Diagnosis not present

## 2022-02-15 DIAGNOSIS — I428 Other cardiomyopathies: Secondary | ICD-10-CM | POA: Diagnosis not present

## 2022-02-15 DIAGNOSIS — I2510 Atherosclerotic heart disease of native coronary artery without angina pectoris: Secondary | ICD-10-CM | POA: Insufficient documentation

## 2022-02-15 LAB — BASIC METABOLIC PANEL
Anion gap: 4 — ABNORMAL LOW (ref 5–15)
BUN: 10 mg/dL (ref 8–23)
CO2: 28 mmol/L (ref 22–32)
Calcium: 9.3 mg/dL (ref 8.9–10.3)
Chloride: 109 mmol/L (ref 98–111)
Creatinine, Ser: 1.19 mg/dL (ref 0.61–1.24)
GFR, Estimated: >60 mL/min (ref >60)
Glucose, Bld: 97 mg/dL (ref 70–99)
Potassium: 4.5 mmol/L (ref 3.5–5.1)
Sodium: 141 mmol/L (ref 135–145)

## 2022-02-15 LAB — LIPID PANEL
Cholesterol: 103 mg/dL (ref 0–200)
HDL: 43 mg/dL (ref >40)
LDL Cholesterol: 52 mg/dL (ref 0–99)
Total CHOL/HDL Ratio: 2.4 RATIO
Triglycerides: 42 mg/dL (ref <150)
VLDL: 8 mg/dL (ref 0–40)

## 2022-02-15 NOTE — Patient Instructions (Signed)
Thank you for coming in today ? ?Labs were done today, if any labs are abnormal the clinic will call you ?No news is good news ? ?Your physician recommends that you schedule a follow-up appointment in:  ?6 months with Dr. Aundra Dubin ? ?At the [facility], you and your health needs are our priority. As part of our continuing mission to provide you with exceptional heart care, we have created designated Provider Care Teams. These Care Teams include your primary Cardiologist (physician) and Advanced Practice Providers (APPs- Physician Assistants and Nurse Practitioners) who all work together to provide you with the care you need, when you need it.  ? ?You may see any of the following providers on your designated Care Team at your next follow up: ?Dr Glori Bickers ?Dr Loralie Champagne ?Darrick Grinder, NP ?Lyda Jester, PA ?Jessica Milford,NP ?Marlyce Huge, PA ?Audry Riles, PharmD ? ? ?Please be sure to bring in all your medications bottles to every appointment.  ? ?If you have any questions or concerns before your next appointment please send [place] a message through [place] or call our office at [phone].   ? ?TO LEAVE A MESSAGE FOR THE NURSE SELECT OPTION 2, PLEASE LEAVE A MESSAGE INCLUDING: ?YOUR NAME ?DATE OF BIRTH ?CALL BACK NUMBER ?REASON FOR CALL**this is important as we prioritize the call backs ? ?YOU WILL RECEIVE A CALL BACK THE SAME DAY AS LONG AS YOU CALL BEFORE 4:00 PM ? ?

## 2022-02-15 NOTE — Progress Notes (Signed)
? ?  ? ? ?ID:  Dylan Benton, DOB [birth year], MRN [id number]   ?Provider location: [facility] ?Type of Visit: Established patient  ? ?PCP:  Biagio Borg, MD  ?Cardiologist:  Quay Burow, MD ?HF Cardiologst: Dr. Aundra Dubin ?  ?History of Present Illness: ?Dylan Benton is a 81 y.o. male who has a history of HTN and cardiomyopathy of uncertain etiology.  He was referred by Dr. Gwenlyn Found for evaluation of dyspnea and CHF.  Patient was first found to have decreased systolic function in 3/81 when echo showed EF 45-50%. At that time, he had mild exertional dyspnea. Last summer, he had gotten to the point where he had to take frequent breaks mowing the lawn.  However, since the beginning of this year, he has had worsening dyspnea.  Echo was done in 1/19, showing EF down to 35-40%.  Cardiolite did not show a definite perfusion defect. 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 Cardiac MRI in 4/19 showed EF 46%, LGE pattern that was concerning for myocarditis. PYP scan was negative.  ? ?Echo in 7/20 showed  EF 45%, RV normal.  Echo in 9/21 showed EF 45-50%, diffuse hypokinesis with normal RV.  ? ?Zio patch x 7 days in 10/21 showed no significant arrhythmia.  ? ?Echo 9/22 EF 50-55%. Peripheral arterial dopplers unchanged from previous study. ? ?Today he returns for HF follow up. Overall feeling fine. Had a fall recently, got light headed when he stood up too fast to urinate. Uses a cane for balance. He does his pedal bike every day and does not have dyspnea with this. Occasional dizziness. He has left shoulder bursitis. He does not have significant dyspnea with walking on flat ground. Able to mow (riding mower) and edge his yard. Denies palpitations, CP, dizziness, edema, or PND/Orthopnea. Appetite ok. No fever or chills. Weight at home 255 pounds. Taking all medications.  ? ?ECG (personally reviewed): Sinus bradycardia 56 bpm.  ? ?Labs (12/18): LDL 47 ?Labs (3/19): K 4.3, creatinine 1.09 ?Labs (4/19):  Myeloma pattern negative ?Labs (5/19): K 4.6, creatinine 1.06 ?Labs (6/19): LDL 48, HDl 42, K 4, creatinine 1.15 ?Labs (6/20): LDL 51, HDl 37, K 4.4, creatinine 1.12, LFTs normal, TSH normal ?Labs (12/20): LDL 64, HDL 43 ?Labs (3/21): K 4.1, creatinine 0.98 ?Labs (6/21): K 4.1, creatinine 1.17 ?Labs (9/21): K 4.2, creatinine 1.0 ?Labs (3/22): K 4.1, creatinine 1.1, LDL 48, B12 normal ?Labs (5/22): K 4.2, creatinine 1.08 ?Labs (9/22): K 4.6, creatinine 1.10 ? ?PMH: ?1. Low back pain ?2. Cardiomyopathy: LHC in 2006 with normal coronaries.  Echo in 1/17 with EF 45-50%.  ?- Echo (1/19): EF 35-40%, mild LVH ?- Cardiolite (2/19): EF 41%, no ischemia/infarction.  ?- LHC (3/19): 50% ostial LAD, 50% ramus. LVEDP 12.  ?- Cardiac MRI (4/19): EF 46%, normal RV size and systolic function, LGE pattern suggestive of prior myocarditis.  ?- PYP scan (4/19): Negative, not suggestive of TTR amyloidosis. ?- Myeloma panel negative ?- Echo (7/20): EF 45%, RV normal, IVC normal ?- Echo (9/21): EF 45-50%, diffuse hypokinesis, normal RV, IVC normal.  ?- Echo (9/22): EF 50-55%, normal RV ?3. HTN ?4. Hyperlipidemia ?5. CAD: LHC (3/19) with 50% ostial LAD, 50% ramus. ?6. COPD: Prior smoker.  PFTs 11/19 with moderate obstruction.  ?7. Vasovagal syncope/presyncope.  ?8. PAD: peripheral arterial dopplers (3/21) showed occluded right AT artery, 30-49% left SFA.  ?- Peripheral arterial dopplers (9/22) unchanged from prior. ?9. Zio patch (10/21): x 7 days, no significant arrhythmias.  ?  10. Peripheral neuropathy ? ?Social History  ? ?Socioeconomic History  ? Marital status: Married  ?  Spouse name: Not on file  ? Number of children: 2  ? Years of education: 84  ? Highest education level: Not on file  ?Occupational History  ? Occupation: Camera operator (Part-time)  ?Tobacco Use  ? Smoking status: Former  ?  Types: Cigarettes  ?  Quit date: 12/02/1988  ?  Years since quitting: 33.2  ? Smokeless tobacco: Never  ?Vaping Use  ? Vaping Use: Never used   ?Substance and Sexual Activity  ? Alcohol use: No  ? Drug use: No  ? Sexual activity: Not on file  ?Other Topics Concern  ? Not on file  ?Social History Narrative  ? Works for [employer] at [employer] in [place].  ? ?Social Determinants of Health  ? ?Financial Resource Strain: Not on file  ?Food Insecurity: Not on file  ?Transportation Needs: Not on file  ?Physical Activity: Not on file  ?Stress: Not on file  ?Social Connections: Not on file  ?Intimate Partner Violence: Not on file  ? ?Family History  ?Problem Relation Age of Onset  ? Coronary artery disease Other   ? Hypertension Other   ? Stroke Other   ? Colon cancer Neg Hx   ? Esophageal cancer Neg Hx   ? Rectal cancer Neg Hx   ? Stomach cancer Neg Hx   ? ?ROS: All systems reviewed and negative except as per HPI. ? ?Current Outpatient Medications  ?Medication Sig Dispense Refill  ? albuterol (VENTOLIN HFA) 108 (90 Base) MCG/ACT inhaler Inhale 2 puffs into the lungs every 6 (six) hours as needed for wheezing or shortness of breath. 8 g 11  ? aspirin 81 MG EC tablet TAKE 1 TABLET BY MOUTH EVERY DAY 90 tablet 3  ? carvedilol (COREG) 6.25 MG tablet TAKE 1 TABLET BY MOUTH 2 TIMES DAILY WITH A MEAL. 180 tablet 3  ? dapagliflozin propanediol (FARXIGA) 10 MG TABS tablet Take 1 tablet (10 mg total) by mouth daily before breakfast. D/c 30 day supply script 90 tablet 3  ? dorzolamide-timolol (COSOPT) 22.3-6.8 MG/ML ophthalmic solution Place 1 drop into both eyes 2 (two) times daily.    ? ENTRESTO 97-103 MG TAKE 1 TABLET BY MOUTH TWICE A DAY 180 tablet 3  ? latanoprost (XALATAN) 0.005 % ophthalmic solution Place 1 drop into both eyes at bedtime.     ? rosuvastatin (CRESTOR) 10 MG tablet TAKE 1 TABLET BY MOUTH EVERY DAY 90 tablet 3  ? spironolactone (ALDACTONE) 25 MG tablet TAKE 1 TABLET BY MOUTH EVERY DAY 90 tablet 3  ? ?No current facility-administered medications for this encounter.  ? ?Wt Readings from Last 3 Encounters:  ?02/15/22 116 kg (255 lb 12.8 oz)  ?09/16/21 118.8 kg  (262 lb)  ?08/28/21 119 kg (262 lb 4 oz)  ? ? ?BP (!) 148/70   Pulse (!) 59   Wt 116 kg (255 lb 12.8 oz)   SpO2 98%   BMI 31.97 kg/m?  ?General:  NAD. No resp difficulty, walked into clinic with cane. ?HEENT: Normal ?Neck: Supple. No JVD. Carotids 2+ bilat; no bruits. No lymphadenopathy or thryomegaly appreciated. ?Cor: PMI nondisplaced. Brady rate & rhythm. No rubs, gallops or murmurs. ?Lungs: Clear ?Abdomen: Soft, nontender, nondistended. No hepatosplenomegaly. No bruits or masses. Good bowel sounds. ?Extremities: No cyanosis, clubbing, rash, edema ?Neuro: Alert & oriented x 3, cranial nerves grossly intact. Moves all 4 extremities w/o difficulty. Affect pleasant. ? ?Assessment/Plan: ?1.  Chronic Systolic CHF: Echo in 7/94 with EF 35-40%, mild LVH. LHC in 3/19 with nonobstructive CAD.  Cardiac MRI in 4/19 showed EF 46%, LGE pattern concerning for prior myocarditis. PYP scan was negative, not suggestive of TTR amyloidosis. Myeloma panel negative.  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 Echo in 7/20 showed EF remains 45%, echo in 9/21 with EF 45-50%.  Echo (9/22) EF 50-55%, normal RV. NYHA class II symptoms, stable. He is not volume overloaded on exam.  ?- Continue Coreg 6.25 mg bid.  With h/o bradycardia, will not increase dose.  ?- Continue Farxiga 10 mg daily.  ?- Continue Entresto 97/103 mg bid. ?- Continue spironolactone 25 mg daily, BMET today.  ?- He takes Lasix prn.  ?2. HTN: Mildly elevated today, but controlled at home. Will not add meds today with recent fall, sounds orthostatic.  ?- Discussed fall precautions. ?3. Hyperlipidemia.  Continue Crestor. Check lipids today. ?4. COPD: Moderate by PFTs.  ?5. PAD: Occluded right AT.  However, he does not seem to have significant claudication.  ?- Continue statin, ASA.  ?- No change in peripheral arterial dopplers (9/22) from previous studies.  ? ?Followup in 6 months with Dr. Aundra Dubin.  ? ?Signed, ?Rafael Bihari,  FNP  ?02/15/2022 ? ?[facility] ?[facility] ?[address] ?[facility] ?[place] [address] ?[phone] (office) ?[phone]) ? ?

## 2022-02-24 ENCOUNTER — Ambulatory Visit (INDEPENDENT_AMBULATORY_CARE_PROVIDER_SITE_OTHER): Payer: Medicare Other | Admitting: Internal Medicine

## 2022-02-24 VITALS — BP 124/80 | HR 56 | Resp 18 | Ht 75.0 in | Wt 254.2 lb

## 2022-02-24 DIAGNOSIS — E538 Deficiency of other specified B group vitamins: Secondary | ICD-10-CM | POA: Diagnosis not present

## 2022-02-24 DIAGNOSIS — R7302 Impaired glucose tolerance (oral): Secondary | ICD-10-CM | POA: Diagnosis not present

## 2022-02-24 DIAGNOSIS — M25511 Pain in right shoulder: Secondary | ICD-10-CM | POA: Diagnosis not present

## 2022-02-24 DIAGNOSIS — M25512 Pain in left shoulder: Secondary | ICD-10-CM | POA: Diagnosis not present

## 2022-02-24 DIAGNOSIS — E559 Vitamin D deficiency, unspecified: Secondary | ICD-10-CM | POA: Diagnosis not present

## 2022-02-24 DIAGNOSIS — I10 Essential (primary) hypertension: Secondary | ICD-10-CM

## 2022-02-24 LAB — CBC WITH DIFFERENTIAL/PLATELET
Basophils Absolute: 0 10*3/uL (ref 0.0–0.1)
Basophils Relative: 0.8 % (ref 0.0–3.0)
Eosinophils Absolute: 0 10*3/uL (ref 0.0–0.7)
Eosinophils Relative: 0.8 % (ref 0.0–5.0)
HCT: 43.8 % (ref 39.0–52.0)
Hemoglobin: 14.6 g/dL (ref 13.0–17.0)
Lymphocytes Relative: 36.4 % (ref 12.0–46.0)
Lymphs Abs: 1.3 10*3/uL (ref 0.7–4.0)
MCHC: 33.4 g/dL (ref 30.0–36.0)
MCV: 98.1 fl (ref 78.0–100.0)
Monocytes Absolute: 0.4 10*3/uL (ref 0.1–1.0)
Monocytes Relative: 11.1 % (ref 3.0–12.0)
Neutro Abs: 1.8 10*3/uL (ref 1.4–7.7)
Neutrophils Relative %: 50.9 % (ref 43.0–77.0)
Platelets: 159 10*3/uL (ref 150.0–400.0)
RBC: 4.46 Mil/uL (ref 4.22–5.81)
RDW: 12.7 % (ref 11.5–15.5)
WBC: 3.5 10*3/uL — ABNORMAL LOW (ref 4.0–10.5)

## 2022-02-24 LAB — URINALYSIS, ROUTINE W REFLEX MICROSCOPIC
Bilirubin Urine: NEGATIVE
Hgb urine dipstick: NEGATIVE
Ketones, ur: NEGATIVE
Leukocytes,Ua: NEGATIVE
Nitrite: POSITIVE — AB
RBC / HPF: NONE SEEN (ref 0–?)
Specific Gravity, Urine: 1.015 (ref 1.000–1.030)
Total Protein, Urine: NEGATIVE
Urine Glucose: >=1000 — AB
Urobilinogen, UA: 1 (ref 0.0–1.0)
pH: 6 (ref 5.0–8.0)

## 2022-02-24 LAB — VITAMIN D 25 HYDROXY (VIT D DEFICIENCY, FRACTURES): VITD: 32.62 ng/mL (ref 30.00–100.00)

## 2022-02-24 LAB — VITAMIN B12: Vitamin B-12: 646 pg/mL (ref 211–911)

## 2022-02-24 LAB — HEPATIC FUNCTION PANEL
ALT: 13 U/L (ref 0–53)
AST: 21 U/L (ref 0–37)
Albumin: 4.4 g/dL (ref 3.5–5.2)
Alkaline Phosphatase: 61 U/L (ref 39–117)
Bilirubin, Direct: 0.2 mg/dL (ref 0.0–0.3)
Total Bilirubin: 0.9 mg/dL (ref 0.2–1.2)
Total Protein: 7 g/dL (ref 6.0–8.3)

## 2022-02-24 LAB — TSH: TSH: 1.5 u[IU]/mL (ref 0.35–5.50)

## 2022-02-24 LAB — HEMOGLOBIN A1C: Hgb A1c MFr Bld: 5.4 % (ref 4.6–6.5)

## 2022-02-24 MED ORDER — MELOXICAM 7.5 MG PO TABS: 7.5000 mg | ORAL_TABLET | Freq: Every day | ORAL | 3 refills | Status: DC

## 2022-02-24 NOTE — Patient Instructions (Signed)
Ok to take the meloxicam as needed for pain ? ?Please continue all other medications as before, and refills have been done if requested. ? ?Please have the pharmacy call with any other refills you may need. ? ?Please continue your efforts at being more active, low cholesterol diet, and weight control. ? ?You are otherwise up to date with prevention measures today. ? ?Please keep your appointments with your specialists as you may have planned ? ?Please go to the LAB at the blood drawing area for the tests to be done ? ?You will be contacted by phone if any changes need to be made immediately.  Otherwise, you will receive a letter about your results with an explanation, but please check with MyChart first. ? ?Please remember to sign up for MyChart if you have not done so, as this will be important to you in the future with finding out test results, communicating by private email, and scheduling acute appointments online when needed. ? ?Please make an Appointment to return in 6 months, or sooner if needed ?

## 2022-02-24 NOTE — Progress Notes (Signed)
Patient ID: Dylan Benton, male   DOB: Mar 14, 1941, 81 y.o.   MRN: [id number] ? ? ? ?     Chief Complaint:: yearly exam ? ?     HPI:  EIVAN Benton is a 81 y.o. male here for above, overall doing well.  Pt denies chest pain, increased sob or doe, wheezing, orthopnea, PND, increased LE swelling, palpitations, dizziness or syncope.   Pt denies polydipsia, polyuria, or new focal neuro s/s.    Pt denies fever, wt loss, night sweats, loss of appetite, or other constitutional symptoms  Also has mild worsening arthritic pain to the shoulders, with midl grinding discomfort, intermittent, without other neck or arm pain.  Not taking Vit D ?  ?Wt Readings from Last 3 Encounters:  ?02/24/22 254 lb 3.2 oz (115.3 kg)  ?02/15/22 255 lb 12.8 oz (116 kg)  ?09/16/21 262 lb (118.8 kg)  ? ?BP Readings from Last 3 Encounters:  ?02/24/22 124/80  ?02/15/22 (!) 148/70  ?09/16/21 (!) 145/72  ? ?Immunization History  ?Administered Date(s) Administered  ? PFIZER(Purple Top)SARS-COV-2 Vaccination 01/24/2020, 02/20/2020, 09/20/2020  ? Pneumococcal Conjugate-13 11/07/2014  ? Pneumococcal Polysaccharide-23 11/09/2017  ? Tdap 04/22/1976  ? ?There are no preventive care reminders to display for this patient. ? ?  ? ?Past Medical History:  ?Diagnosis Date  ? Allergy   ? Anemia, unspecified   ? NOS ?resolved  ? Anxiety   ? Aortic root dilation (HCC)   ? Arthritis   ? ASTHMA, UNSPECIFIED, UNSPECIFIED STATUS 08/06/2009  ? Annotation: No exacerbations.  Qualifier: Diagnosis of  By: Kelton Pillar MD, [place]    ? Bladder calculus 05/05/2012  ? Cataract   ? Chest pain   ? ECHOCARDIOGRAM, ABNORMAL 12/26/2006  ?  Aortic root dilation  This problem surfaced in 2006 after a cardiology referral for chest pain.  He was seen by Dr. Haroldine Laws in January o6 and cathed on Jan. 18.  Coronaries were nl and EF was 65%.  Echo showed mild aortic root dilation of 82m. He was started on metoprolol (although I notice that he is no longer on this.  Annual echo to follow aortic root  was advised.  Aortic root dimension was unchanged on studies in 2009 and 2010.  Will continue to follow this, perhaps not every year as it seems stable.   ? Erectile dysfunction 05/05/2012  ? GERD (gastroesophageal reflux disease) 05/05/2012  ? H pylori ulcer   ? teated H pylori  ? History of back surgery   ? Hyperlipidemia 05/08/2012  ? Hypertension   ? Impaired glucose tolerance 05/05/2012  ? Lumbar disc disease 05/08/2012  ? Male stress incontinence 05/05/2012  ? Myocardial infarction ([facility]   ? pt states, "i've been told I have had two heart attacks  ? Obesity   ? OBESITY 10/06/2006  ? Qualifier: Diagnosis of  By: RStann MainlandMD, SNicole Kindred   ? Prostate cancer ([place]   ? Hx of   ? PROSTATE CANCER, HX OF 08/06/2009  ? Annotation: prostatectomy and radiotherapy in 1990s,  Dr. WJeffie PollockQualifier: Diagnosis of  By: DKelton PillarMD, MMovico   ? PUD (peptic ulcer disease) 05/05/2012  ? Systolic CHF ([facility] 62/68/3419 ? Thoracic aortic aneurysm ([facility]   ? 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 Annotation: Managed by opthalmologist Dr. BEdilia BoQualifier: Diagnosis of  By: DKelton PillarMD, MSt. Benedict   ? ?Past Surgical History:  ?Procedure Laterality Date  ? BACK SURGERY    ? ear surgury    ? eye  surgury    ? LEFT HEART CATH AND CORONARY ANGIOGRAPHY N/A 02/06/2018  ? Procedure: LEFT HEART CATH AND CORONARY ANGIOGRAPHY;  Surgeon: Larey Dresser, MD;  Location: [facility];  Service: Cardiovascular;  Laterality: N/A;  ? PROSTATECTOMY    ? ? reports that he quit smoking about 33 years ago. His smoking use included cigarettes. He has never used smokeless tobacco. He reports that he does not drink alcohol and does not use drugs. ?family history includes Coronary artery disease in an other family member; Hypertension in an other family member; Stroke in an other family member. ?No Known Allergies ?Current Outpatient Medications on File Prior to Visit  ?Medication Sig Dispense Refill  ? albuterol (VENTOLIN HFA) 108 (90 Base) MCG/ACT inhaler Inhale 2  puffs into the lungs every 6 (six) hours as needed for wheezing or shortness of breath. 8 g 11  ? aspirin 81 MG EC tablet TAKE 1 TABLET BY MOUTH EVERY DAY 90 tablet 3  ? carvedilol (COREG) 6.25 MG tablet TAKE 1 TABLET BY MOUTH 2 TIMES DAILY WITH A MEAL. 180 tablet 3  ? dapagliflozin propanediol (FARXIGA) 10 MG TABS tablet Take 1 tablet (10 mg total) by mouth daily before breakfast. D/c 30 day supply script 90 tablet 3  ? dorzolamide-timolol (COSOPT) 22.3-6.8 MG/ML ophthalmic solution Place 1 drop into both eyes 2 (two) times daily.    ? ENTRESTO 97-103 MG TAKE 1 TABLET BY MOUTH TWICE A DAY 180 tablet 3  ? latanoprost (XALATAN) 0.005 % ophthalmic solution Place 1 drop into both eyes at bedtime.     ? rosuvastatin (CRESTOR) 10 MG tablet TAKE 1 TABLET BY MOUTH EVERY DAY 90 tablet 3  ? spironolactone (ALDACTONE) 25 MG tablet TAKE 1 TABLET BY MOUTH EVERY DAY 90 tablet 3  ? ?No current facility-administered medications on file prior to visit.  ? ?     ROS:  All others reviewed and negative. ? ?Objective  ? ?     PE:  BP 124/80   Pulse (!) 56   Resp 18   Ht '6\' 3"'$  (1.905 m)   Wt 254 lb 3.2 oz (115.3 kg)   SpO2 97%   BMI 31.77 kg/m?  ? ?              Constitutional: Pt appears in NAD ?              HENT: Head: NCAT.  ?              Right Ear: External ear normal.   ?              Left Ear: External ear normal.  ?              Eyes: . Pupils are equal, round, and reactive to light. Conjunctivae and EOM are normal ?              Nose: without d/c or deformity ?              Neck: Neck supple. Gross normal ROM ?              Cardiovascular: Normal rate and regular rhythm.   ?              Pulmonary/Chest: Effort normal and breath sounds without rales or wheezing.  ?              Abd:  Soft, NT, ND, + BS, no organomegaly ?  Neurological: Pt is alert. At baseline orientation, motor grossly intact ?              Skin: Skin is warm. No rashes, no other new lesions, LE edema - none ?              Psychiatric: Pt  behavior is normal without agitation  ? ?Micro: none ? ?Cardiac tracings I have personally interpreted today:  none ? ?Pertinent Radiological findings (summarize): none  ? ?Lab Results  ?Component Value Date  ? WBC 3.5 (L) 02/24/2022  ? HGB 14.6 02/24/2022  ? HCT 43.8 02/24/2022  ? PLT 159.0 02/24/2022  ? GLUCOSE 97 02/15/2022  ? CHOL 103 02/15/2022  ? TRIG 42 02/15/2022  ? HDL 43 02/15/2022  ? [place] 52 02/15/2022  ? ALT 13 02/24/2022  ? AST 21 02/24/2022  ? NA 141 02/15/2022  ? K 4.5 02/15/2022  ? CL 109 02/15/2022  ? CREATININE 1.19 02/15/2022  ? BUN 10 02/15/2022  ? CO2 28 02/15/2022  ? TSH 1.50 02/24/2022  ? PSA 1.65 11/09/2017  ? INR 1.09 01/30/2018  ? HGBA1C 5.4 02/24/2022  ? ?Assessment/Plan:  ?JAMARRION BUDAI is a 81 y.o. Black or African American [2] male with  has a past medical history of Allergy, Anemia, unspecified, Anxiety, Aortic root dilation ([place]), Arthritis, ASTHMA, UNSPECIFIED, UNSPECIFIED STATUS (08/06/2009), Bladder calculus (05/05/2012), Cataract, Chest pain, ECHOCARDIOGRAM, ABNORMAL (12/26/2006), Erectile dysfunction (05/05/2012), GERD (gastroesophageal reflux disease) (05/05/2012), H pylori ulcer, History of back surgery, Hyperlipidemia (05/08/2012), Hypertension, Impaired glucose tolerance (05/05/2012), Lumbar disc disease (05/08/2012), Male stress incontinence (05/05/2012), Myocardial infarction ([place]), Obesity, OBESITY (10/06/2006), Prostate cancer ([place]), PROSTATE CANCER, HX OF (08/06/2009), PUD (peptic ulcer disease) (7/35/3299), Systolic CHF ([place]) (2/42/6834), Thoracic aortic aneurysm ([place]), and UNSPECIFIED OPEN-ANGLE GLAUCOMA (08/06/2009). ? ?Bilateral shoulder pain ?Mild worsening chronic intermittent pain, for add mobic 7.5 qd prn,  to f/u any worsening symptoms or concerns ? ?Vitamin D deficiency ?Last vitamin D ?Lab Results  ?Component Value Date  ? VD25OH 32.62 02/24/2022  ? ?Low, to start oral replacement ? ?Essential hypertension ?BP Readings from Last 3 Encounters:  ?02/24/22 124/80   ?02/15/22 (!) 148/70  ?09/16/21 (!) 145/72  ? ?Stable, pt to continue medical treatment coreg, entresto, aldactone ? ? ?Impaired glucose tolerance ?Lab Results  ?Component Value Date  ? HGBA1C 5.4 02/24/2022  ? ?Stable

## 2022-02-27 ENCOUNTER — Encounter: Payer: Self-pay | Admitting: Internal Medicine

## 2022-02-27 DIAGNOSIS — E559 Vitamin D deficiency, unspecified: Secondary | ICD-10-CM | POA: Insufficient documentation

## 2022-02-27 DIAGNOSIS — M25511 Pain in right shoulder: Secondary | ICD-10-CM | POA: Insufficient documentation

## 2022-02-27 NOTE — Assessment & Plan Note (Signed)
Mild worsening chronic intermittent pain, for add mobic 7.5 qd prn,  to f/u any worsening symptoms or concerns ?

## 2022-02-27 NOTE — Assessment & Plan Note (Signed)
BP Readings from Last 3 Encounters:  ?02/24/22 124/80  ?02/15/22 (!) 148/70  ?09/16/21 (!) 145/72  ? ?Stable, pt to continue medical treatment coreg, entresto, aldactone ? ?

## 2022-02-27 NOTE — Assessment & Plan Note (Signed)
Lab Results  ?Component Value Date  ? HGBA1C 5.4 02/24/2022  ? ?Stable, pt to continue current medical treatment farxiga ? ?

## 2022-02-27 NOTE — Assessment & Plan Note (Signed)
Last vitamin D ?Lab Results  ?Component Value Date  ? VD25OH 32.62 02/24/2022  ? ?Low, to start oral replacement ?

## 2022-03-19 DIAGNOSIS — H401133 Primary open-angle glaucoma, bilateral, severe stage: Secondary | ICD-10-CM | POA: Diagnosis not present

## 2022-03-22 DIAGNOSIS — H401133 Primary open-angle glaucoma, bilateral, severe stage: Secondary | ICD-10-CM | POA: Diagnosis not present

## 2022-04-05 ENCOUNTER — Telehealth: Payer: Self-pay | Admitting: Internal Medicine

## 2022-04-05 NOTE — Telephone Encounter (Signed)
LVM for pt to rtn my call to schedule AWV with [facility]. Please schedule this appt with [facility] if pt calls the office.  ?

## 2022-04-07 ENCOUNTER — Ambulatory Visit (INDEPENDENT_AMBULATORY_CARE_PROVIDER_SITE_OTHER): Payer: Medicare Other

## 2022-04-07 VITALS — Ht 75.0 in | Wt 250.0 lb

## 2022-04-07 DIAGNOSIS — Z0000 Encounter for general adult medical examination without abnormal findings: Secondary | ICD-10-CM

## 2022-04-07 NOTE — Patient Instructions (Signed)
Dylan Benton , ?Thank you for taking time to come for your Medicare Wellness Visit. I appreciate your ongoing commitment to your health goals. Please review the following plan we discussed and let me know if I can assist you in the future.  ? ?Screening recommendations/referrals: ?Colonoscopy: 09/16/2021; no repeat ?Recommended yearly ophthalmology/optometry visit for glaucoma screening and checkup ?Recommended yearly dental visit for hygiene and checkup ? ?Vaccinations: ?Influenza vaccine: declined ?Pneumococcal vaccine: 11/07/2014, 11/09/2017 ?Tdap vaccine: 09/21/2013; due every 10 years ?Shingles vaccine: declined   ?Covid-19: 01/24/2020, 02/20/2020, 09/20/2020 ? ?Advanced directives: No ? ?Conditions/risks identified: Yes ? ?Next appointment: Please schedule your next Medicare Wellness Visit with your Nurse Health Advisor in 1 year by calling [phone]. ? ?Preventive Care 9 Years and Older, Male ?Preventive care refers to lifestyle choices and visits with your health care provider that can promote health and wellness. ?What does preventive care include? ?A yearly physical exam. This is also called an annual well check. ?Dental exams once or twice a year. ?Routine eye exams. Ask your health care provider how often you should have your eyes checked. ?Personal lifestyle choices, including: ?Daily care of your teeth and gums. ?Regular physical activity. ?Eating a healthy diet. ?Avoiding tobacco and drug use. ?Limiting alcohol use. ?Practicing safe sex. ?Taking low doses of aspirin every day. ?Taking vitamin and mineral supplements as recommended by your health care provider. ?What happens during an annual well check? ?The services and screenings done by your health care provider during your annual well check will depend on your age, overall health, lifestyle risk factors, and family history of disease. ?Counseling  ?Your health care provider may ask you questions about your: ?Alcohol use. ?Tobacco use. ?Drug  use. ?Emotional well-being. ?Home and relationship well-being. ?Sexual activity. ?Eating habits. ?History of falls. ?Memory and ability to understand (cognition). ?Work and work Statistician. ?Screening  ?You may have the following tests or measurements: ?Height, weight, and BMI. ?Blood pressure. ?Lipid and cholesterol levels. These may be checked every 5 years, or more frequently if you are over 45 years old. ?Skin check. ?Lung cancer screening. You may have this screening every year starting at age 85 if you have a 30-pack-year history of smoking and currently smoke or have quit within the past 15 years. ?Fecal occult blood test (FOBT) of the stool. You may have this test every year starting at age 53. ?Flexible sigmoidoscopy or colonoscopy. You may have a sigmoidoscopy every 5 years or a colonoscopy every 10 years starting at age 59. ?Prostate cancer screening. Recommendations will vary depending on your family history and other risks. ?Hepatitis C blood test. ?Hepatitis B blood test. ?Sexually transmitted disease (STD) testing. ?Diabetes screening. This is done by checking your blood sugar (glucose) after you have not eaten for a while (fasting). You may have this done every 1-3 years. ?Abdominal aortic aneurysm (AAA) screening. You may need this if you are a current or former smoker. ?Osteoporosis. You may be screened starting at age 14 if you are at high risk. ?Talk with your health care provider about your test results, treatment options, and if necessary, the need for more tests. ?Vaccines  ?Your health care provider may recommend certain vaccines, such as: ?Influenza vaccine. This is recommended every year. ?Tetanus, diphtheria, and acellular pertussis (Tdap, Td) vaccine. You may need a Td booster every 10 years. ?Zoster vaccine. You may need this after age 65. ?Pneumococcal 13-valent conjugate (PCV13) vaccine. One dose is recommended after age 23. ?Pneumococcal polysaccharide (PPSV23) vaccine. One dose is  recommended after age 21. ?Talk to your health care provider about which screenings and vaccines you need and how often you need them. ?This information is not intended to replace advice given to you by your health care provider. Make sure you discuss any questions you have with your health care provider. ?Document Released: 12/05/2015 Document Revised: 07/28/2016 Document Reviewed: 09/09/2015 ?Elsevier Interactive Patient Education ? 2017 [place]. ? ?Fall Prevention in the Home ?Falls can cause injuries. They can happen to people of all ages. There are many things you can do to make your home safe and to help prevent falls. ?What can I do on the outside of my home? ?Regularly fix the edges of walkways and driveways and fix any cracks. ?Remove anything that might make you trip as you walk through a door, such as a raised step or threshold. ?Trim any bushes or trees on the path to your home. ?Use bright outdoor lighting. ?Clear any walking paths of anything that might make someone trip, such as rocks or tools. ?Regularly check to see if handrails are loose or broken. Make sure that both sides of any steps have handrails. ?Any raised decks and porches should have guardrails on the edges. ?Have any leaves, snow, or ice cleared regularly. ?Use sand or salt on walking paths during winter. ?Clean up any spills in your garage right away. This includes oil or grease spills. ?What can I do in the bathroom? ?Use night lights. ?Install grab bars by the toilet and in the tub and shower. Do not use towel bars as grab bars. ?Use non-skid mats or decals in the tub or shower. ?If you need to sit down in the shower, use a plastic, non-slip stool. ?Keep the floor dry. Clean up any water that spills on the floor as soon as it happens. ?Remove soap buildup in the tub or shower regularly. ?Attach bath mats securely with double-sided non-slip rug tape. ?Do not have throw rugs and other things on the floor that can make you  trip. ?What can I do in the bedroom? ?Use night lights. ?Make sure that you have a light by your bed that is easy to reach. ?Do not use any sheets or blankets that are too big for your bed. They should not hang down onto the floor. ?Have a firm chair that has side arms. You can use this for support while you get dressed. ?Do not have throw rugs and other things on the floor that can make you trip. ?What can I do in the kitchen? ?Clean up any spills right away. ?Avoid walking on wet floors. ?Keep items that you use a lot in easy-to-reach places. ?If you need to reach something above you, use a strong step stool that has a grab bar. ?Keep electrical cords out of the way. ?Do not use floor polish or wax that makes floors slippery. If you must use wax, use non-skid floor wax. ?Do not have throw rugs and other things on the floor that can make you trip. ?What can I do with my stairs? ?Do not leave any items on the stairs. ?Make sure that there are handrails on both sides of the stairs and use them. Fix handrails that are broken or loose. Make sure that handrails are as long as the stairways. ?Check any carpeting to make sure that it is firmly attached to the stairs. Fix any carpet that is loose or worn. ?Avoid having throw rugs at the top or bottom of the stairs. If you  do have throw rugs, attach them to the floor with carpet tape. ?Make sure that you have a light switch at the top of the stairs and the bottom of the stairs. If you do not have them, ask someone to add them for you. ?What else can I do to help prevent falls? ?Wear shoes that: ?Do not have high heels. ?Have rubber bottoms. ?Are comfortable and fit you well. ?Are closed at the toe. Do not wear sandals. ?If you use a stepladder: ?Make sure that it is fully opened. Do not climb a closed stepladder. ?Make sure that both sides of the stepladder are locked into place. ?Ask someone to hold it for you, if possible. ?Clearly mark and make sure that you can  see: ?Any grab bars or handrails. ?First and last steps. ?Where the edge of each step is. ?Use tools that help you move around (mobility aids) if they are needed. These include: ?Canes. ?Walkers. ?Scooters. ?Crutches. ?Turn on

## 2022-04-07 NOTE — Progress Notes (Signed)
?I connected with Dylan Benton today by telephone and verified that I am speaking with the correct person using two identifiers. ?Location patient: home ?Location provider: work ?Persons participating in the virtual visit: patient, provider. ?  ?I discussed the limitations, risks, security and privacy concerns of performing an evaluation and management service by telephone and the availability of in person appointments. I also discussed with the patient that there may be a patient responsible charge related to this service. The patient expressed understanding and verbally consented to this telephonic visit.  ?  ?Interactive audio and video telecommunications were attempted between this provider and patient, however failed, due to patient having technical difficulties OR patient did not have access to video capability.  We continued and completed visit with audio only. ? ?Some vital signs may be absent or patient reported.  ? ?Time Spent with patient on telephone encounter: 30 minutes ? ?Subjective:  ? Dylan Benton is a 81 y.o. male who presents for Medicare Annual/Subsequent preventive examination. ? ?Review of Systems    ? ?Cardiac Risk Factors include: advanced age (>39mn, >>22women);dyslipidemia;family history of premature cardiovascular disease;hypertension;male gender;obesity (BMI >30kg/m2) ? ?   ?Objective:  ?  ?Today's Vitals  ? 04/07/22 1045  ?Weight: 250 lb (113.4 kg)  ?Height: '6\' 3"'$  (1.905 m)  ? ?Body mass index is 31.25 kg/m?. ? ? ?  04/07/2022  ? 10:35 AM 05/22/2020  ?  9:20 AM 09/26/2018  ?  3:54 AM 02/06/2018  ?  6:04 AM 03/12/2017  ?  9:58 PM 09/17/2014  ?  2:24 AM  ?Advanced Directives  ?Does Patient Have a Medical Advance Directive? No Yes No No No No  ?Does patient want to make changes to medical advance directive?  No - Patient declined      ?Would patient like information on creating a medical advance directive? No - Patient declined  No - Patient declined No - Patient declined No - Patient  declined No - patient declined information  ? ? ?Current Medications (verified) ?Outpatient Encounter Medications as of 04/07/2022  ?Medication Sig  ? albuterol (VENTOLIN HFA) 108 (90 Base) MCG/ACT inhaler Inhale 2 puffs into the lungs every 6 (six) hours as needed for wheezing or shortness of breath.  ? aspirin 81 MG EC tablet TAKE 1 TABLET BY MOUTH EVERY DAY  ? carvedilol (COREG) 6.25 MG tablet TAKE 1 TABLET BY MOUTH 2 TIMES DAILY WITH A MEAL.  ? dapagliflozin propanediol (FARXIGA) 10 MG TABS tablet Take 1 tablet (10 mg total) by mouth daily before breakfast. D/c 30 day supply script  ? dorzolamide-timolol (COSOPT) 22.3-6.8 MG/ML ophthalmic solution Place 1 drop into both eyes 2 (two) times daily.  ? ENTRESTO 97-103 MG TAKE 1 TABLET BY MOUTH TWICE A DAY  ? latanoprost (XALATAN) 0.005 % ophthalmic solution Place 1 drop into both eyes at bedtime.   ? meloxicam (MOBIC) 7.5 MG tablet Take 1 tablet (7.5 mg total) by mouth daily.  ? rosuvastatin (CRESTOR) 10 MG tablet TAKE 1 TABLET BY MOUTH EVERY DAY  ? spironolactone (ALDACTONE) 25 MG tablet TAKE 1 TABLET BY MOUTH EVERY DAY  ? ?No facility-administered encounter medications on file as of 04/07/2022.  ? ? ?Allergies (verified) ?Patient has no known allergies.  ? ?History: ?Past Medical History:  ?Diagnosis Date  ? Allergy   ? Anemia, unspecified   ? NOS ?resolved  ? Anxiety   ? Aortic root dilation (HCC)   ? Arthritis   ? ASTHMA, UNSPECIFIED, UNSPECIFIED STATUS 08/06/2009  ?  Annotation: No exacerbations.  Qualifier: Diagnosis of  By: Kelton Pillar MD, [place]    ? Bladder calculus 05/05/2012  ? Cataract   ? Chest pain   ? ECHOCARDIOGRAM, ABNORMAL 12/26/2006  ?  Aortic root dilation  This problem surfaced in 2006 after a cardiology referral for chest pain.  He was seen by Dr. Haroldine Laws in January o6 and cathed on Jan. 18.  Coronaries were nl and EF was 65%.  Echo showed mild aortic root dilation of 37m. He was started on metoprolol (although I notice that he is no longer on this.   Annual echo to follow aortic root was advised.  Aortic root dimension was unchanged on studies in 2009 and 2010.  Will continue to follow this, perhaps not every year as it seems stable.   ? Erectile dysfunction 05/05/2012  ? GERD (gastroesophageal reflux disease) 05/05/2012  ? H pylori ulcer   ? teated H pylori  ? History of back surgery   ? Hyperlipidemia 05/08/2012  ? Hypertension   ? Impaired glucose tolerance 05/05/2012  ? Lumbar disc disease 05/08/2012  ? Male stress incontinence 05/05/2012  ? Myocardial infarction ([facility]   ? pt states, "i've been told I have had two heart attacks  ? Obesity   ? OBESITY 10/06/2006  ? Qualifier: Diagnosis of  By: RStann MainlandMD, SNicole Kindred   ? Prostate cancer ([place]   ? Hx of   ? PROSTATE CANCER, HX OF 08/06/2009  ? Annotation: prostatectomy and radiotherapy in 1990s,  Dr. WJeffie PollockQualifier: Diagnosis of  By: DKelton PillarMD, MDawson   ? PUD (peptic ulcer disease) 05/05/2012  ? Systolic CHF ([facility] [employer] 64/88/8916 ? Thoracic aortic aneurysm ([place]   ? 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 Annotation: Managed by opthalmologist Dr. BEdilia BoQualifier: Diagnosis of  By: DKelton PillarMD, MHobucken   ? ?Past Surgical History:  ?Procedure Laterality Date  ? BACK SURGERY    ? ear surgury    ? eye surgury    ? LEFT HEART CATH AND CORONARY ANGIOGRAPHY N/A 02/06/2018  ? Procedure: LEFT HEART CATH AND CORONARY ANGIOGRAPHY;  Surgeon: MLarey Dresser MD;  [facility];  Service: Cardiovascular;  Laterality: N/A;  ? PROSTATECTOMY    ? ?Family History  ?Problem Relation Age of Onset  ? Coronary artery disease Other   ? Hypertension Other   ? Stroke Other   ? Colon cancer Neg Hx   ? Esophageal cancer Neg Hx   ? Rectal cancer Neg Hx   ? Stomach cancer Neg Hx   ? ?Social History  ? ?Socioeconomic History  ? Marital status: Married  ?  Spouse name: Not on file  ? Number of children: 2  ? Years of education: 111 ? Highest education level: Not on file  ?Occupational History  ? 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 Smoking status: Former  ?  Types: Cigarettes  ?  Quit date: 12/02/1988  ?  Years since quitting: 33.3  ? Smokeless tobacco: Never  ?Vaping Use  ? Vaping Use: Never used  ?Substance and Sexual Activity  ? Alcohol use: No  ? Drug use: No  ? Sexual activity: Not on file  ?Other Topics Concern  ? Not on file  ?Social History Narrative  ? Works for a[employer]at [employer]  ? ?Social Determinants of Health  ? ?Financial Resource Strain: Low Risk   ? Difficulty of Paying Living Expenses: Not hard at all  ?Food  Insecurity: No Food Insecurity  ? Worried About Charity fundraiser in the Last Year: Never true  ? Ran Out of Food in the Last Year: Never true  ?Transportation Needs: No Transportation Needs  ? Lack of Transportation (Medical): No  ? Lack of Transportation (Non-Medical): No  ?Physical Activity: Sufficiently Active  ? Days of Exercise per Week: 5 days  ? Minutes of Exercise per Session: 30 min  ?Stress: No Stress Concern Present  ? Feeling of Stress : Not at all  ?Social Connections: Socially Integrated  ? Frequency of Communication with Friends and Family: More than three times a week  ? Frequency of Social Gatherings with Friends and Family: More than three times a week  ? Attends Religious Services: More than 4 times per year  ? Active Member of Clubs or Organizations: Yes  ? Attends Archivist Meetings: More than 4 times per year  ? Marital Status: Married  ? ? ?Tobacco Counseling ?Counseling given: Not Answered ? ? ?Clinical Intake: ? ?Pre-visit preparation completed: Yes ? ?Pain : No/denies pain ? ?  ? ?BMI - recorded: 31.77 ?Nutritional Status: BMI > 30  Obese ?Nutritional Risks: None ?Diabetes: No ? ?How often do you need to have someone help you when you read instructions, pamphlets, or other written materials from your doctor or pharmacy?: 1 - Never ?What is the last grade level you completed in school?: Associate's Degree and 3 years of college courses ? ?Diabetic?  no ? ?Interpreter Needed?: No ? ?Information entered by :: Lisette Abu, LPN ? ? ?Activities of Daily Living ? ?  04/07/2022  ? 10:51 AM 08/25/2021  ?  1:02 PM  ?In your present state of health, do you have any di

## 2022-04-09 ENCOUNTER — Other Ambulatory Visit (HOSPITAL_COMMUNITY): Payer: Self-pay | Admitting: Cardiology

## 2022-04-30 ENCOUNTER — Other Ambulatory Visit: Payer: Self-pay | Admitting: Internal Medicine

## 2022-04-30 ENCOUNTER — Other Ambulatory Visit (HOSPITAL_COMMUNITY): Payer: Self-pay | Admitting: Cardiology

## 2022-04-30 NOTE — Telephone Encounter (Signed)
Please refill as per office routine med refill policy (all routine meds to be refilled for 3 mo or monthly (per pt preference) up to one year from last visit, then month to month grace period for 3 mo, then further med refills will have to be denied) ? ?

## 2022-05-05 DIAGNOSIS — C61 Malignant neoplasm of prostate: Secondary | ICD-10-CM | POA: Diagnosis not present

## 2022-05-12 DIAGNOSIS — N3946 Mixed incontinence: Secondary | ICD-10-CM | POA: Diagnosis not present

## 2022-05-12 DIAGNOSIS — R8279 Other abnormal findings on microbiological examination of urine: Secondary | ICD-10-CM | POA: Diagnosis not present

## 2022-05-12 DIAGNOSIS — R8271 Bacteriuria: Secondary | ICD-10-CM | POA: Diagnosis not present

## 2022-05-12 DIAGNOSIS — C61 Malignant neoplasm of prostate: Secondary | ICD-10-CM | POA: Diagnosis not present

## 2022-07-07 ENCOUNTER — Other Ambulatory Visit (HOSPITAL_COMMUNITY): Payer: Self-pay | Admitting: Cardiology

## 2022-07-29 ENCOUNTER — Other Ambulatory Visit: Payer: Self-pay | Admitting: Internal Medicine

## 2022-07-29 NOTE — Telephone Encounter (Signed)
Please refill as per office routine med refill policy (all routine meds to be refilled for 3 mo or monthly (per pt preference) up to one year from last visit, then month to month grace period for 3 mo, then further med refills will have to be denied) ? ?

## 2022-08-04 DIAGNOSIS — H2511 Age-related nuclear cataract, right eye: Secondary | ICD-10-CM | POA: Diagnosis not present

## 2022-08-04 DIAGNOSIS — Z961 Presence of intraocular lens: Secondary | ICD-10-CM | POA: Diagnosis not present

## 2022-08-04 DIAGNOSIS — H401133 Primary open-angle glaucoma, bilateral, severe stage: Secondary | ICD-10-CM | POA: Diagnosis not present

## 2022-09-01 ENCOUNTER — Ambulatory Visit (INDEPENDENT_AMBULATORY_CARE_PROVIDER_SITE_OTHER): Payer: Medicare Other

## 2022-09-01 ENCOUNTER — Encounter: Payer: Self-pay | Admitting: Internal Medicine

## 2022-09-01 ENCOUNTER — Ambulatory Visit (INDEPENDENT_AMBULATORY_CARE_PROVIDER_SITE_OTHER): Payer: Medicare Other | Admitting: Internal Medicine

## 2022-09-01 VITALS — BP 142/80 | HR 55 | Temp 98.5°F | Ht 75.0 in | Wt 250.0 lb

## 2022-09-01 DIAGNOSIS — M79675 Pain in left toe(s): Secondary | ICD-10-CM | POA: Diagnosis not present

## 2022-09-01 DIAGNOSIS — E559 Vitamin D deficiency, unspecified: Secondary | ICD-10-CM | POA: Diagnosis not present

## 2022-09-01 DIAGNOSIS — E785 Hyperlipidemia, unspecified: Secondary | ICD-10-CM | POA: Diagnosis not present

## 2022-09-01 DIAGNOSIS — S92412A Displaced fracture of proximal phalanx of left great toe, initial encounter for closed fracture: Secondary | ICD-10-CM | POA: Diagnosis not present

## 2022-09-01 DIAGNOSIS — R7302 Impaired glucose tolerance (oral): Secondary | ICD-10-CM | POA: Diagnosis not present

## 2022-09-01 DIAGNOSIS — I10 Essential (primary) hypertension: Secondary | ICD-10-CM | POA: Diagnosis not present

## 2022-09-01 LAB — BASIC METABOLIC PANEL
BUN: 12 mg/dL (ref 6–23)
CO2: 31 mEq/L (ref 19–32)
Calcium: 9.8 mg/dL (ref 8.4–10.5)
Chloride: 103 mEq/L (ref 96–112)
Creatinine, Ser: 1.12 mg/dL (ref 0.40–1.50)
GFR: 60.74 mL/min (ref >60.00)
Glucose, Bld: 96 mg/dL (ref 70–99)
Potassium: 4.6 mEq/L (ref 3.5–5.1)
Sodium: 141 mEq/L (ref 135–145)

## 2022-09-01 LAB — CBC WITH DIFFERENTIAL/PLATELET
Basophils Absolute: 0 10*3/uL (ref 0.0–0.1)
Basophils Relative: 0.9 % (ref 0.0–3.0)
Eosinophils Absolute: 0 10*3/uL (ref 0.0–0.7)
Eosinophils Relative: 1.3 % (ref 0.0–5.0)
HCT: 43 % (ref 39.0–52.0)
Hemoglobin: 14.5 g/dL (ref 13.0–17.0)
Lymphocytes Relative: 39.9 % (ref 12.0–46.0)
Lymphs Abs: 1.4 10*3/uL (ref 0.7–4.0)
MCHC: 33.8 g/dL (ref 30.0–36.0)
MCV: 98.2 fl (ref 78.0–100.0)
Monocytes Absolute: 0.4 10*3/uL (ref 0.1–1.0)
Monocytes Relative: 11.4 % (ref 3.0–12.0)
Neutro Abs: 1.6 10*3/uL (ref 1.4–7.7)
Neutrophils Relative %: 46.5 % (ref 43.0–77.0)
Platelets: 160 10*3/uL (ref 150.0–400.0)
RBC: 4.38 Mil/uL (ref 4.22–5.81)
RDW: 12.2 % (ref 11.5–15.5)
WBC: 3.4 10*3/uL — ABNORMAL LOW (ref 4.0–10.5)

## 2022-09-01 LAB — URINALYSIS, ROUTINE W REFLEX MICROSCOPIC
Bilirubin Urine: NEGATIVE
Hgb urine dipstick: NEGATIVE
Ketones, ur: NEGATIVE
Leukocytes,Ua: NEGATIVE
Nitrite: POSITIVE — AB
RBC / HPF: NONE SEEN (ref 0–?)
Specific Gravity, Urine: 1.015 (ref 1.000–1.030)
Total Protein, Urine: NEGATIVE
Urine Glucose: >=1000 — AB
Urobilinogen, UA: 1 (ref 0.0–1.0)
pH: 5.5 (ref 5.0–8.0)

## 2022-09-01 LAB — LIPID PANEL
Cholesterol: 117 mg/dL (ref 0–200)
HDL: 50.9 mg/dL (ref >39.00)
LDL Cholesterol: 55 mg/dL (ref 0–99)
NonHDL: 66.56
Total CHOL/HDL Ratio: 2
Triglycerides: 56 mg/dL (ref 0.0–149.0)
VLDL: 11.2 mg/dL (ref 0.0–40.0)

## 2022-09-01 LAB — HEPATIC FUNCTION PANEL
ALT: 14 U/L (ref 0–53)
AST: 20 U/L (ref 0–37)
Albumin: 4.3 g/dL (ref 3.5–5.2)
Alkaline Phosphatase: 60 U/L (ref 39–117)
Bilirubin, Direct: 0.2 mg/dL (ref 0.0–0.3)
Total Bilirubin: 0.8 mg/dL (ref 0.2–1.2)
Total Protein: 7.2 g/dL (ref 6.0–8.3)

## 2022-09-01 LAB — HEMOGLOBIN A1C: Hgb A1c MFr Bld: 5.4 % (ref 4.6–6.5)

## 2022-09-01 LAB — VITAMIN D 25 HYDROXY (VIT D DEFICIENCY, FRACTURES): VITD: 45.01 ng/mL (ref 30.00–100.00)

## 2022-09-01 LAB — TSH: TSH: 1.65 u[IU]/mL (ref 0.35–5.50)

## 2022-09-01 NOTE — Assessment & Plan Note (Signed)
BP Readings from Last 3 Encounters:  09/01/22 (!) 142/80  02/24/22 124/80  02/15/22 (!) 148/70   Uncontrolled here  But pt states controlled at home,  pt to continue medical treatment coreg 6.25 bid, entresto

## 2022-09-01 NOTE — Assessment & Plan Note (Signed)
Recent traumaw with persistent symptoms , cant r/o fx - for film today

## 2022-09-01 NOTE — Assessment & Plan Note (Signed)
Lab Results  Component Value Date   HGBA1C 5.4 09/01/2022   Stable, pt to continue current medical treatment farxiga 10 mg qd

## 2022-09-01 NOTE — Progress Notes (Signed)
Patient ID: Dylan Benton, male   DOB: 05/16/1941, 81 y.o.   MRN: [id number]        Chief Complaint: follow up right great toe trauma, htn, hld, hyperglycemia, low vit d       HPI:  Dylan Benton is a 81 y.o. male here with c/o 2 wks post truama stubbing left great toe on hard furniture, now still swelling red pain with walking, mild.  No ulcers or nail damage.  Pt denies chest pain, increased sob or doe, wheezing, orthopnea, PND, increased LE swelling, palpitations, dizziness or syncope.   Pt denies polydipsia, polyuria, or new focal neuro s/s.    Pt denies fever, wt loss, night sweats, loss of appetite, or other constitutional symptoms   Lost significant wt intentionally from 285 now to 247 at home per pt  due for shingrix at the pharmacy Wt Readings from Last 3 Encounters:  09/01/22 250 lb (113.4 kg)  04/07/22 250 lb (113.4 kg)  02/24/22 254 lb 3.2 oz (115.3 kg)   BP Readings from Last 3 Encounters:  09/01/22 (!) 142/80  02/24/22 124/80  02/15/22 (!) 148/70         Past Medical History:  Diagnosis Date   Allergy    Anemia, unspecified    NOS ?resolved   Anxiety    Aortic root dilation (HCC)    Arthritis    ASTHMA, UNSPECIFIED, UNSPECIFIED STATUS 08/06/2009   Annotation: No exacerbations.  Qualifier: Diagnosis of  By: Kelton Pillar MD, Madhav     Bladder calculus 05/05/2012   Cataract    Chest pain    ECHOCARDIOGRAM, ABNORMAL 12/26/2006    Aortic root dilation  This problem surfaced in 2006 after a cardiology referral for chest pain.  He was seen by Dr. Haroldine Laws in January o6 and cathed on Jan. 18.  Coronaries were nl and EF was 65%.  Echo showed mild aortic root dilation of 31m. He was started on metoprolol (although I notice that he is no longer on this.  Annual echo to follow aortic root was advised.  Aortic root dimension was unchanged on studies in 2009 and 2010.  Will continue to follow this, perhaps not every year as it seems stable.    Erectile dysfunction 05/05/2012   GERD  (gastroesophageal reflux disease) 05/05/2012   H pylori ulcer    teated H pylori   History of back surgery    Hyperlipidemia 05/08/2012   Hypertension    Impaired glucose tolerance 05/05/2012   Lumbar disc disease 05/08/2012   Male stress incontinence 05/05/2012   Myocardial infarction ([facility]    pt states, "i've been told I have had two heart attacks   Obesity    OBESITY 10/06/2006   Qualifier: Diagnosis of  By: RStann MainlandMD, SNicole Kindred    Prostate cancer ([facility]    Hx of    PROSTATE CANCER, HX OF 08/06/2009   Annotation: prostatectomy and radiotherapy in 1990s,  Dr. WJeffie PollockQualifier: Diagnosis of  By: DKelton PillarMD, Madhav     PUD (peptic ulcer disease) 61/76/1607  Systolic CHF ([facility] 63/71/0626  [place]acic [place]    UNSPECIFIED OPEN-ANGLE GLAUCOMA 08/06/2009   Annotation: Managed by opthalmologist Dr. BEdilia BoQualifier: Diagnosis of  By: DKelton PillarMD, Madhav     Past Surgical History:  Procedure Laterality Date   BACK SURGERY     ear surgury     eye surgury     LEFT HEART CATH AND CORONARY ANGIOGRAPHY N/A 02/06/2018   Procedure:  LEFT HEART CATH AND CORONARY ANGIOGRAPHY;  Surgeon: Larey Dresser, MD;  Location: [place] [facility];  Service: Cardiovascular;  Laterality: N/A;   PROSTATECTOMY      reports that he quit smoking about 33 years ago. His smoking use included cigarettes. He has never used smokeless tobacco. He reports that he does not drink alcohol and does not use drugs. family history includes Coronary artery disease in an other family member; Hypertension in an other family member; Stroke in an other family member. No Known Allergies Current Outpatient Medications on File Prior to Visit  Medication Sig Dispense Refill   albuterol (VENTOLIN HFA) 108 (90 Base) MCG/ACT inhaler Inhale 2 puffs into the lungs every 6 (six) hours as needed for wheezing or shortness of breath. 8 g 11   aspirin 81 MG EC tablet TAKE 1 TABLET BY MOUTH EVERY DAY 90 tablet 3   carvedilol (COREG) 6.25 MG  tablet TAKE 1 TABLET BY MOUTH 2 TIMES DAILY WITH A MEAL. 180 tablet 3   dorzolamide-timolol (COSOPT) 22.3-6.8 MG/ML ophthalmic solution Place 1 drop into both eyes 2 (two) times daily.     ENTRESTO 97-103 MG TAKE 1 TABLET BY MOUTH TWICE A DAY 180 tablet 3   FARXIGA 10 MG TABS tablet TAKE 1 TABLET (10 MG TOTAL) BY MOUTH DAILY BEFORE BREAKFAST. D/C 30 DAY SUPPLY SCRIPT 90 tablet 3   latanoprost (XALATAN) 0.005 % ophthalmic solution Place 1 drop into both eyes at bedtime.      meloxicam (MOBIC) 7.5 MG tablet Take 1 tablet (7.5 mg total) by mouth daily. 90 tablet 3   rosuvastatin (CRESTOR) 10 MG tablet TAKE 1 TABLET BY MOUTH EVERY DAY 90 tablet 0   spironolactone (ALDACTONE) 25 MG tablet TAKE 1 TABLET BY MOUTH EVERY DAY 90 tablet 3   No current facility-administered medications on file prior to visit.        ROS:  All others reviewed and negative.  Objective        PE:  BP (!) 142/80 (BP Location: Right Arm, Patient Position: Sitting, Cuff Size: Large)   Pulse (!) 55   Temp 98.5 F (36.9 C) (Oral)   Ht '6\' 3"'$  (1.905 m)   Wt 250 lb (113.4 kg)   SpO2 98%   BMI 31.25 kg/m                 Constitutional: Pt appears in NAD               HENT: Head: NCAT.                Right Ear: External ear normal.                 Left Ear: External ear normal.                Eyes: . Pupils are equal, round, and reactive to light. Conjunctivae and EOM are normal               Nose: without d/c or deformity               Neck: Neck supple. Gross normal ROM               Cardiovascular: Normal rate and regular rhythm.                 Pulmonary/Chest: Effort normal and breath sounds without rales or wheezing.  Abd:  Soft, NT, ND, + BS, no organomegaly;    left great toe with 1+ red, tender, swelling o/w no ulcer                Neurological: Pt is alert. At baseline orientation, motor grossly intact               Skin: Skin is warm. No rashes, no other new lesions, LE edema - none                 Psychiatric: Pt behavior is normal without agitation                  Micro: none  Cardiac tracings I have personally interpreted today:  none  Pertinent Radiological findings (summarize): none   Lab Results  Component Value Date   WBC 3.4 (L) 09/01/2022   HGB 14.5 09/01/2022   HCT 43.0 09/01/2022   PLT 160.0 09/01/2022   GLUCOSE 96 09/01/2022   CHOL 117 09/01/2022   TRIG 56.0 09/01/2022   HDL 50.90 09/01/2022   LDLCALC 55 09/01/2022   ALT 14 09/01/2022   AST 20 09/01/2022   NA 141 09/01/2022   K 4.6 09/01/2022   CL 103 09/01/2022   CREATININE 1.12 09/01/2022   BUN 12 09/01/2022   CO2 31 09/01/2022   TSH 1.65 09/01/2022   PSA 1.65 11/09/2017   INR 1.09 01/30/2018   HGBA1C 5.4 09/01/2022   Assessment/Plan:  Dylan Benton is a 81 y.o. Black or African American [2] male with  has a past medical history of Allergy, Anemia, unspecified, Anxiety, Aortic root dilation ([place]), Arthritis, ASTHMA, UNSPECIFIED, UNSPECIFIED STATUS (08/06/2009), Bladder calculus (05/05/2012), Cataract, Chest pain, ECHOCARDIOGRAM, ABNORMAL (12/26/2006), Erectile dysfunction (05/05/2012), GERD (gastroesophageal reflux disease) (05/05/2012), H pylori ulcer, History of back surgery, Hyperlipidemia (05/08/2012), Hypertension, Impaired glucose tolerance (05/05/2012), Lumbar disc disease (05/08/2012), Male stress incontinence (05/05/2012), Myocardial infarction ([place]), Obesity, OBESITY (10/06/2006), Prostate cancer ([place]), PROSTATE CANCER, HX OF (08/06/2009), PUD (peptic ulcer disease) (4/85/4627), Systolic CHF ([place]) (0/35/0093), Thoracic aortic aneurysm ([place]), and UNSPECIFIED OPEN-ANGLE GLAUCOMA (08/06/2009).  Vitamin D deficiency Last vitamin D Lab Results  Component Value Date   VD25OH 45.01 09/01/2022   Stable, cont oral replacement   Great toe pain, left Recent [place] with persistent symptoms , cant r/o fx - for film today  Impaired glucose tolerance Lab Results  Component Value Date   HGBA1C 5.4 09/01/2022    Stable, pt to continue current medical treatment farxiga 10 mg qd   Hyperlipidemia Lab Results  Component Value Date   LDLCALC 55 09/01/2022   Stable, pt to continue current statin crestor 10 mg qd   Essential hypertension BP Readings from Last 3 Encounters:  09/01/22 (!) 142/80  02/24/22 124/80  02/15/22 (!) 148/70   Uncontrolled here  But pt states controlled at home,  pt to continue medical treatment coreg 6.25 bid, entresto  Followup: Return in about 6 months (around 03/03/2023).  Cathlean Cower, MD 09/01/2022 8:55 PM [facility]

## 2022-09-01 NOTE — Assessment & Plan Note (Signed)
Lab Results  Component Value Date   LDLCALC 55 09/01/2022   Stable, pt to continue current statin crestor 10 mg qd

## 2022-09-01 NOTE — Assessment & Plan Note (Signed)
Last vitamin D Lab Results  Component Value Date   VD25OH 45.01 09/01/2022   Stable, cont oral replacement

## 2022-09-01 NOTE — Patient Instructions (Signed)
Please have your Shingrix (shingles) shots done at your local pharmacy.  Please continue all other medications as before, and refills have been done if requested.  Please have the pharmacy call with any other refills you may need.  Please continue your efforts at being more active, low cholesterol diet, and weight control.  You are otherwise up to date with prevention measures today.  Please keep your appointments with your specialists as you may have planned  Please go to the XRAY Department in the first floor for the x-ray testing  Please go to the LAB at the blood drawing area for the tests to be done  You will be contacted by phone if any changes need to be made immediately.  Otherwise, you will receive a letter about your results with an explanation, but please check with MyChart first.  Please remember to sign up for MyChart if you have not done so, as this will be important to you in the future with finding out test results, communicating by private email, and scheduling acute appointments online when needed.  Please make an Appointment to return in 6 months, or sooner if needed, also with Lab Appointment for testing done 3-5 days before at the [facility] (so this is for TWO appointments - please see the scheduling desk as you leave)

## 2022-09-03 ENCOUNTER — Other Ambulatory Visit: Payer: Self-pay | Admitting: Internal Medicine

## 2022-09-03 DIAGNOSIS — S92415A Nondisplaced fracture of proximal phalanx of left great toe, initial encounter for closed fracture: Secondary | ICD-10-CM

## 2022-09-03 DIAGNOSIS — S92422A Displaced fracture of distal phalanx of left great toe, initial encounter for closed fracture: Secondary | ICD-10-CM

## 2022-09-09 ENCOUNTER — Ambulatory Visit (HOSPITAL_COMMUNITY)
Admission: RE | Admit: 2022-09-09 | Discharge: 2022-09-09 | Disposition: A | Discharge status: Home / Self Care | Payer: Medicare Other | Source: Ambulatory Visit | Attending: Cardiology | Admitting: Cardiology

## 2022-09-09 VITALS — BP 122/68 | HR 55 | Wt 252.2 lb

## 2022-09-09 DIAGNOSIS — R001 Bradycardia, unspecified: Secondary | ICD-10-CM | POA: Diagnosis not present

## 2022-09-09 DIAGNOSIS — I5022 Chronic systolic (congestive) heart failure: Secondary | ICD-10-CM

## 2022-09-09 NOTE — Patient Instructions (Signed)
There has been no changes to your medications.  Your physician has requested that you have an echocardiogram. Echocardiography is a painless test that uses sound waves to create images of your heart. It provides your doctor with information about the size and shape of your heart and how well your heart's chambers and valves are working. This procedure takes approximately one hour. There are no restrictions for this procedure. Please do NOT wear cologne, perfume, aftershave, or lotions (deodorant is allowed). Please arrive 15 minutes prior to your appointment time.  Peripheral arterial doppler has been ordered. You will be called to have the test arranged.  Your physician recommends that you schedule a follow-up appointment in: 6 months ( April 2024)  ** please call the office in February to arrange your follow up appointment. **  If you have any questions or concerns before your next appointment please send [place] a message through [place] or call our office at [phone].    TO LEAVE A MESSAGE FOR THE NURSE SELECT OPTION 2, PLEASE LEAVE A MESSAGE INCLUDING: YOUR NAME DATE OF BIRTH CALL BACK NUMBER REASON FOR CALL**this is important as we prioritize the call backs  YOU WILL RECEIVE A CALL BACK THE SAME DAY AS LONG AS YOU CALL BEFORE 4:00 PM  At the [facility], you and your health needs are our priority. As part of our continuing mission to provide you with exceptional heart care, we have created designated Provider Care Teams. These Care Teams include your primary Cardiologist (physician) and Advanced Practice Providers (APPs- Physician Assistants and Nurse Practitioners) who all work together to provide you with the care you need, when you need it.   You may see any of the following providers on your designated Care Team at your next follow up: Dr Glori Bickers Dr Loralie Champagne Dr. Roxana Hires, NP Lyda Jester, [place] [facility] [place],  [place] Forestine Na, NP Audry Riles, PharmD   Please be sure to bring in all your medications bottles to every appointment.

## 2022-09-11 NOTE — Progress Notes (Signed)
ID:  Dylan Benton, DOB 1941-04-06, MRN [id number]   Provider location:  Advanced Heart Failure Type of Visit: Established patient   PCP:  Biagio Borg, MD  Cardiologist:  Quay Burow, MD Primary HF: Dr. Aundra Dubin   History of Present Illness: Dylan Benton is a 81 y.o. male who has a history of HTN and cardiomyopathy of uncertain etiology.  He was referred by Dr. Gwenlyn Found for evaluation of dyspnea and CHF.  Patient was first found to have decreased systolic function in 4/96 when echo showed EF 45-50%. At that time, he had mild exertional dyspnea. Last summer, he had gotten to the point where he had to take frequent breaks mowing the lawn.  However, since the beginning of this year, he has had worsening dyspnea.  Echo was done in 1/19, showing EF down to 35-40%.  Cardiolite did not show a definite perfusion defect. 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 Cardiac MRI in 4/19 showed EF 46%, LGE pattern that was concerning for myocarditis. PYP scan was negative.   Echo in 7/20 showed  EF 45%, RV normal.  Echo in 9/21 showed EF 45-50%, diffuse hypokinesis with normal RV.  Echo in 9/22 showed EF 50-55%.   Zio patch x 7 days in 10/21 showed no significant arrhythmia.   He returns for followup of CHF.   Weight is down 3 lbs.  No dyspnea walking on flat ground.  No chest pain.  No claudication.  No lightheadedness.  He has been doing well in general.    ECG (personally reviewed): NSR   Labs (12/18): LDL 47 Labs (3/19): K 4.3, creatinine 1.09 Labs (4/19): Myeloma pattern negative Labs (5/19): K 4.6, creatinine 1.06 Labs (6/19): LDL 48, HDl 42, K 4, creatinine 1.15 Labs (6/20): LDL 51, HDl 37, K 4.4, creatinine 1.12, LFTs normal, TSH normal Labs (12/20): LDL 64, HDL 43 Labs (3/21): K 4.1, creatinine 0.98 Labs (6/21): K 4.1, creatinine 1.17 Labs (9/21): K 4.2, creatinine 1.0 Labs (3/22): K 4.1, creatinine 1.1, LDL 48, B12 normal Labs (5/22): K 4.2, creatinine 1.08 Labs  (10/23): K 4.6, creatinine 1.12, LDL 55  PMH: 1. Low back pain 2. Cardiomyopathy: LHC in 2006 with normal coronaries.  Echo in 1/17 with EF 45-50%.  - Echo (1/19): EF 35-40%, mild LVH - Cardiolite (2/19): EF 41%, no ischemia/infarction.  - LHC (3/19): 50% ostial LAD, 50% ramus. LVEDP 12.  - Cardiac MRI (4/19): EF 46%, normal RV size and systolic function, LGE pattern suggestive of prior myocarditis.  - PYP scan (4/19): Negative, not suggestive of TTR amyloidosis. - Myeloma panel negative - Echo (7/20): EF 45%, RV normal, IVC normal - Echo (9/21): EF 45-50%, diffuse hypokinesis, normal RV, IVC normal.  - Echo (9/22): EF 50-55%.  3. HTN 4. Hyperlipidemia 5. CAD: LHC (3/19) with 50% ostial LAD, 50% ramus. 6. COPD: Prior smoker.  PFTs 11/19 with moderate obstruction.  7. Vasovagal syncope/presyncope.  8. PAD: peripheral arterial dopplers (3/21) showed occluded right AT artery, 30-49% left SFA.  9. Zio patch (10/21): x 7 days, no significant arrhythmias.  10. Peripheral neuropathy  Social History   Socioeconomic History   Marital status: Married    Spouse name: Not on file   Number of children: 2   Years of education: 14   Highest education level: Not on file  Occupational History   Occupation: Camera operator (Part-time)  Tobacco Use   Smoking status: Former    Types: Cigarettes  Quit date: 12/02/1988    Years since quitting: 33.7   Smokeless tobacco: Never  Vaping Use   Vaping Use: Never used  Substance and Sexual Activity   Alcohol use: No   Drug use: No   Sexual activity: Not on file  Other Topics Concern   Not on file  Social History Narrative   Works for [employer] at [employer] in [place].   Social Determinants of Health   Financial Resource Strain: Low Risk  (04/07/2022)   Overall Financial Resource Strain (CARDIA)    Difficulty of Paying Living Expenses: Not hard at all  Food Insecurity: No Food Insecurity (04/07/2022)   Hunger Vital Sign    Worried About  Running Out of Food in the Last Year: Never true    Ran Out of Food in the Last Year: Never true  Transportation Needs: No Transportation Needs (04/07/2022)   PRAPARE - Hydrologist (Medical): No    Lack of Transportation (Non-Medical): No  Physical Activity: Sufficiently Active (04/07/2022)   Exercise Vital Sign    Days of Exercise per Week: 5 days    Minutes of Exercise per Session: 30 min  Stress: No Stress Concern Present (04/07/2022)   [place]    Feeling of Stress : Not at all  Social Connections: [place] (04/07/2022)   Social Connection and Isolation Panel [NHANES]    Frequency of Communication with Friends and Family: More than three times a week    Frequency of Social Gatherings with Friends and Family: More than three times a week    Attends Religious Services: More than 4 times per year    Active Member of [employer] or Organizations: Yes    Attends Music therapist: More than 4 times per year    Marital Status: Married  Human resources officer Violence: Not At Risk (04/07/2022)   Humiliation, Afraid, Rape, and Kick questionnaire    Fear of Current or Ex-Partner: No    Emotionally Abused: No    Physically Abused: No    Sexually Abused: No   Family History  Problem Relation Age of Onset   Coronary artery disease Other    Hypertension Other    Stroke Other    Colon cancer Neg Hx    Esophageal cancer Neg Hx    Rectal cancer Neg Hx    Stomach cancer Neg Hx    ROS: All systems reviewed and negative except as per HPI.  Current Outpatient Medications  Medication Sig Dispense Refill   albuterol (VENTOLIN HFA) 108 (90 Base) MCG/ACT inhaler Inhale 2 puffs into the lungs every 6 (six) hours as needed for wheezing or shortness of breath. 8 g 11   aspirin 81 MG EC tablet TAKE 1 TABLET BY MOUTH EVERY DAY 90 tablet 3   carvedilol (COREG) 6.25 MG tablet TAKE 1 TABLET BY MOUTH  2 TIMES DAILY WITH A MEAL. 180 tablet 3   dorzolamide-timolol (COSOPT) 22.3-6.8 MG/ML ophthalmic solution Place 1 drop into both eyes 2 (two) times daily.     ENTRESTO 97-103 MG TAKE 1 TABLET BY MOUTH TWICE A DAY 180 tablet 3   FARXIGA 10 MG TABS tablet TAKE 1 TABLET (10 MG TOTAL) BY MOUTH DAILY BEFORE BREAKFAST. D/C 30 DAY SUPPLY SCRIPT 90 tablet 3   latanoprost (XALATAN) 0.005 % ophthalmic solution Place 1 drop into both eyes at bedtime.      meloxicam (MOBIC) 7.5 MG tablet Take 1 tablet (7.5  mg total) by mouth daily. 90 tablet 3   rosuvastatin (CRESTOR) 10 MG tablet TAKE 1 TABLET BY MOUTH EVERY DAY 90 tablet 0   spironolactone (ALDACTONE) 25 MG tablet TAKE 1 TABLET BY MOUTH EVERY DAY 90 tablet 3   No current facility-administered medications for this encounter.   BP 122/68   Pulse (!) 55   Wt 114.4 kg (252 lb 3.2 oz)   SpO2 98%   BMI 31.52 kg/m  General: NAD Neck: No JVD, no thyromegaly or thyroid nodule.  Lungs: Clear to auscultation bilaterally with normal respiratory effort. CV: Nondisplaced PMI.  Heart regular S1/S2, no S3/S4, no murmur.  No peripheral edema.  No carotid bruit.  Difficult to palpate pedal pulses.  Abdomen: Soft, nontender, no hepatosplenomegaly, no distention.  Skin: Intact without lesions or rashes.  Neurologic: Alert and oriented x 3.  Psych: Normal affect. Extremities: No clubbing or cyanosis.  HEENT: Normal.   Assessment/Plan: 1. Chronic systolic CHF: Echo in 8/14 with EF 35-40%, mild LVH. LHC in 3/19 with nonobstructive CAD.  Cardiac MRI in 4/19 showed EF 46%, LGE pattern concerning for prior myocarditis. PYP scan was negative, not suggestive of TTR amyloidosis. Myeloma panel negative.  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 Echo in 7/20 showed EF remains 45%, echo in 9/21 with EF 45-50%. Echo in 9/22 showed EF 50-55%.   NYHA class II symptoms, stable. He is not volume overloaded on exam.  - Continue Coreg 6.25 mg  bid.  With h/o bradycardia, will not increase dose.  - Continue Jardiance 10 mg daily.  - Continue Entresto 97/103 mg bid. - He takes Lasix prn.  - Continue spironolactone 25 mg daily, BMET today.  - Repeat echo in 6 months.  2. HTN: BP not elevated.  3. Hyperlipidemia.  Continue Crestor, good lipids in 10/23.    4. COPD: Moderate by PFTs.  5. PAD: Occluded right AT.  However, he does not seem to have significant claudication.  - Continue statin, ASA.  - I will arrange for repeat peripheral arterial dopplers.   Followup in 6 months with echo.   Signed, Loralie Champagne, MD  09/11/2022  [facility] [place] [address] [facility] [place] [address] [phone] (office) [phone] (fax)

## 2022-09-13 ENCOUNTER — Other Ambulatory Visit (HOSPITAL_COMMUNITY): Payer: Self-pay | Admitting: Cardiology

## 2022-09-13 DIAGNOSIS — I739 Peripheral vascular disease, unspecified: Secondary | ICD-10-CM

## 2022-09-14 ENCOUNTER — Encounter: Payer: Self-pay | Admitting: Sports Medicine

## 2022-09-14 ENCOUNTER — Ambulatory Visit (INDEPENDENT_AMBULATORY_CARE_PROVIDER_SITE_OTHER): Payer: Medicare Other

## 2022-09-14 ENCOUNTER — Ambulatory Visit (INDEPENDENT_AMBULATORY_CARE_PROVIDER_SITE_OTHER): Payer: Medicare Other | Admitting: Sports Medicine

## 2022-09-14 VITALS — Ht 75.0 in | Wt 250.0 lb

## 2022-09-14 DIAGNOSIS — M79675 Pain in left toe(s): Secondary | ICD-10-CM

## 2022-09-14 DIAGNOSIS — S92415A Nondisplaced fracture of proximal phalanx of left great toe, initial encounter for closed fracture: Secondary | ICD-10-CM

## 2022-09-14 NOTE — Progress Notes (Signed)
Dylan Benton - 81 y.o. male MRN [id number]  Date of birth: 1941-02-08  Office Visit Note: Visit Date: 09/14/2022 PCP: Biagio Borg, MD Referred by: Biagio Borg, MD  Subjective: Chief Complaint  Patient presents with   Left Great Toe - Pain, Fracture   HPI: Dylan Benton "Dylan Benton" is a pleasant 81 y.o. male who presents today for great toe pain.   He states that about 1.5 months ago he stubbed the toe.  Felt like he tripped on the sock and great toe bent forward.  He had an immediate pain but try to walk it off and treated conservatively.  He then saw his [place] a few weeks later who sent him to primary doctor for x-rays of the great toe.  He had x-rays on 09/01/2022 which showed a comminuted fracture involving the head of the first proximal phalanx.  He was then sent here for evaluation.  Today, Dylan Benton states that he has little to no pain at all in the toe.  He has been ambulating in regular tennis shoes without much difficulty.  He does walk with a cane at baseline and has been using this to get by.  He does take meloxicam once daily for the back pain, but does feel like it helped his toe as well.  He initially had some bruising and swelling of the toe, but reports this is improving.  Pertinent ROS were reviewed with the patient and found to be negative unless otherwise specified above in HPI.   Assessment & Plan: Visit Diagnoses:  1. Closed nondisplaced fracture of proximal phalanx of left great toe, initial encounter   2. Great toe pain, left    Plan: Discussed with Dylan Benton and reviewed his x-rays which show no movement about his great toe fracture.  It is comminuted and does extend into the joint however there is no significant displacement and his pain is essentially none at this time as injury happened about 6 weeks ago.  Going up onto the toe does cause some mild pain, so I did put him in some green sports insoles with more of a rigid sole to prevent excessive flexion about the  great toe with ambulating.  When he follows up in 2 weeks I will put a support pad underneath the first toe for support and help with pushoff.  Did discuss possible surgical treatment with putting a pin in the toe, however given his pain is essentially none we both mutually agreed we will hold off on surgical evaluation at this time and proceed with conservative management.  Follow-up in 2 weeks with repeat x-rays of the great toe.  Follow-up: Return in about 2 weeks (around 09/28/2022) for repeat xray of toe .   Meds & Orders: No orders of the defined types were placed in this encounter.   Orders Placed This Encounter  Procedures   XR Toe Great Left     Procedures: No procedures performed      Clinical History: No specialty comments available.  He reports that he quit smoking about 33 years ago. His smoking use included cigarettes. He has never used smokeless tobacco.  Recent Labs    02/24/22 1437 09/01/22 1342  HGBA1C 5.4 5.4    Objective:   Vital Signs: Ht '6\' 3"'$  (1.905 m)   Wt 250 lb (113.4 kg)   BMI 31.25 kg/m   Physical Exam  Gen: Well-appearing, in no acute distress; non-toxic CV: Regular Rate. Well-perfused. Warm.  Resp: Breathing unlabored  on room air; no wheezing. Psych: Fluid speech in conversation; appropriate affect; normal thought process Neuro: Sensation intact throughout. No gross coordination deficits.   Ortho Exam - Left toe: There is some mild bony bossing near the proximal phalanx of the toe.  No significant bruising or swelling.  He has hallux rigidus of bilateral great toes.  Able to bend and flex the toe equivocally bilaterally.  There is no significant TTP upon palpation.  Neurovascular intact, nailbed intact.  Imaging: XR Toe Great Left  Result Date: 09/14/2022 3 views of the left great toe including AP, oblique and lateral films were ordered and reviewed by myself.  X-rays demonstrate a comminuted fracture of the proximal phalanx of the great toe  that does appear to extend intra-articularly.  There may be a small degree of dorsal displacement on the lateral view although AP shows no significant displacement.  Mild bunion deformity.   Past Medical/Family/Surgical/Social History: Medications & Allergies reviewed per EMR, new medications updated. Patient Active Problem List   Diagnosis Date Noted   Great toe pain, left 09/01/2022   Bilateral shoulder pain 02/27/2022   Vitamin D deficiency 02/27/2022   External otitis of left ear 11/14/2019   COPD (chronic obstructive pulmonary disease) ([place]) 11/10/2018   UTI (urinary tract infection) 05/10/2018   Abnormal urine odor 08/23/2017   Cough 03/03/2017   Wheezing 03/03/2017   Paresthesias 11/09/2016   Trigger finger, acquired 11/09/2016   Leg cramps 11/09/2016   Fatigue 11/09/2016   Left lumbar radiculopathy 18/84/1660   Systolic CHF ([address]) 63/11/6008   Peripheral edema 03/04/2016   Macular scar of left eye 08/30/2015   Multiple thyroid nodules 06/20/2014   Rash and nonspecific skin eruption 05/09/2014   Prostate cancer ([place]) 05/09/2014   Hypersomnolence 05/08/2013   Personal history of colonic polyps 05/08/2013   Aortic root dilation ([place]) 05/08/2012   Hyperlipidemia 05/08/2012   Lumbar disc disease 05/08/2012   Left sided sciatica 05/08/2012   Impaired glucose tolerance 05/05/2012   Male stress incontinence 05/05/2012   Erectile dysfunction 05/05/2012   PUD (peptic ulcer disease) 05/05/2012   GERD (gastroesophageal reflux disease) 05/05/2012   Bladder calculus 05/05/2012   Preventative health care 05/05/2012   Anemia, unspecified    Anxiety    Arthritis of left knee    Cataract, nuclear 04/30/2012   Neurogenic pain of foot 05/03/2011   Primary open angle glaucoma of both eyes, severe stage 08/06/2009   Asthma 08/06/2009   PAIN IN JOINT PELVIC REGION AND THIGH 08/06/2009   ECHOCARDIOGRAM, ABNORMAL 12/26/2006   OBESITY 10/06/2006   Essential hypertension 10/06/2006    Past Medical History:  Diagnosis Date   Allergy    Anemia, unspecified    NOS ?resolved   Anxiety    Aortic root dilation (HCC)    Arthritis    ASTHMA, UNSPECIFIED, UNSPECIFIED STATUS 08/06/2009   Annotation: No exacerbations.  Qualifier: Diagnosis of  By: Kelton Pillar MD, Madhav     Bladder calculus 05/05/2012   Cataract    Chest pain    ECHOCARDIOGRAM, ABNORMAL 12/26/2006    Aortic root dilation  This problem surfaced in 2006 after a cardiology referral for chest pain.  He was seen by Dr. Haroldine Laws in January o6 and cathed on Jan. 18.  Coronaries were nl and EF was 65%.  Echo showed mild aortic root dilation of 56m. He was started on metoprolol (although I notice that he is no longer on this.  Annual echo to follow aortic root was advised.  Aortic  root dimension was unchanged on studies in 2009 and 2010.  Will continue to follow this, perhaps not every year as it seems stable.    Erectile dysfunction 05/05/2012   GERD (gastroesophageal reflux disease) 05/05/2012   H pylori ulcer    teated H pylori   History of back surgery    Hyperlipidemia 05/08/2012   Hypertension    Impaired glucose tolerance 05/05/2012   Lumbar disc disease 05/08/2012   Male stress incontinence 05/05/2012   Myocardial infarction [facility])    pt states, "i've been told I have had two heart attacks   Obesity    OBESITY 10/06/2006   Qualifier: Diagnosis of  By: Stann Mainland MD, Nicole Kindred     Prostate cancer [facility])    Hx of    PROSTATE CANCER, HX OF 08/06/2009   Annotation: prostatectomy and radiotherapy in 1990s,  Dr. Jeffie Pollock Qualifier: Diagnosis of  By: Kelton Pillar MD, Madhav     PUD (peptic ulcer disease) 9/51/8841   Systolic CHF ([place]) 6/60/6301   Thoracic aortic aneurysm ([place])    UNSPECIFIED OPEN-ANGLE GLAUCOMA 08/06/2009   Annotation: Managed by opthalmologist Dr. Edilia Bo Qualifier: Diagnosis of  By: Kelton Pillar MD, Madhav     Family History  Problem Relation Age of Onset   Coronary artery disease Other    Hypertension Other    Stroke  Other    Colon cancer Neg Hx    Esophageal cancer Neg Hx    Rectal cancer Neg Hx    Stomach cancer Neg Hx    Past Surgical History:  Procedure Laterality Date   BACK SURGERY     ear surgury     eye surgury     LEFT HEART CATH AND CORONARY ANGIOGRAPHY N/A 02/06/2018   Procedure: LEFT HEART CATH AND CORONARY ANGIOGRAPHY;  Surgeon: Larey Dresser, MD;  Location: [place] [facility];  Service: Cardiovascular;  Laterality: N/A;   PROSTATECTOMY     Social History   Occupational History   Occupation: Camera operator (Part-time)  Tobacco Use   Smoking status: Former    Types: Cigarettes    Quit date: 12/02/1988    Years since quitting: 33.8   Smokeless tobacco: Never  Vaping Use   Vaping Use: Never used  Substance and Sexual Activity   Alcohol use: No   Drug use: No   Sexual activity: Not on file

## 2022-09-14 NOTE — Progress Notes (Signed)
Left great toe fracture This has been going on for 1 month  He was not given any type of protective shoe for this  He doesn't have too much pain

## 2022-09-16 ENCOUNTER — Telehealth: Payer: Self-pay | Admitting: Sports Medicine

## 2022-09-16 NOTE — Telephone Encounter (Signed)
Pt advised he has an issue with his foot brace wants someone to call [phone]

## 2022-09-17 ENCOUNTER — Other Ambulatory Visit: Payer: Self-pay | Admitting: Sports Medicine

## 2022-09-17 ENCOUNTER — Telehealth: Payer: Self-pay | Admitting: Sports Medicine

## 2022-09-17 DIAGNOSIS — M79675 Pain in left toe(s): Secondary | ICD-10-CM

## 2022-09-17 NOTE — Telephone Encounter (Signed)
Faxed order to [facility]; informed patient

## 2022-09-17 NOTE — Telephone Encounter (Signed)
Please call patient [phone]

## 2022-09-28 ENCOUNTER — Encounter: Payer: Self-pay | Admitting: Sports Medicine

## 2022-09-28 ENCOUNTER — Ambulatory Visit (INDEPENDENT_AMBULATORY_CARE_PROVIDER_SITE_OTHER): Payer: Medicare Other

## 2022-09-28 ENCOUNTER — Ambulatory Visit (INDEPENDENT_AMBULATORY_CARE_PROVIDER_SITE_OTHER): Payer: Medicare Other | Admitting: Sports Medicine

## 2022-09-28 DIAGNOSIS — M79675 Pain in left toe(s): Secondary | ICD-10-CM

## 2022-09-28 DIAGNOSIS — S92415D Nondisplaced fracture of proximal phalanx of left great toe, subsequent encounter for fracture with routine healing: Secondary | ICD-10-CM | POA: Diagnosis not present

## 2022-09-28 NOTE — Progress Notes (Signed)
[name] - 81 y.o. male MRN [id number]  Date of birth: [birth year]  Office Visit Note: Visit Date: 09/28/2022 PCP: Biagio Borg, MD Referred by: Biagio Borg, MD  Subjective: Chief Complaint  Patient presents with   Left Foot - Fracture, Pain   HPI: CURBY CARSWELL is a pleasant 80 y.o. male who presents today for follow-up of great toe fracture.   Date of injury was about 2 months ago when he stubbed the toe.  He states he is doing great at this point.  Does not have any residual pain.  Walking in regular tennis shoes.  Every blue moon when he pulls the covers over in the bed the toe will give him a slight discomfort, but very manageable.  Pertinent ROS were reviewed with the patient and found to be negative unless otherwise specified above in HPI.   Assessment & Plan: Visit Diagnoses:  1. Closed nondisplaced fracture of proximal phalanx of left great toe with routine healing, subsequent encounter   2. Great toe pain, left    Plan: At this point, Mikki Santee is about 2 months out from his great toe fracture.  He essentially has no pain and has no difficulty ambulating.  Did discuss he will continue to wear some supportive shoe wear, and then over the next month may transition out of this to what ever shoewear he desires.  He may use topical Voltaren gel for any aggravating pain around the joint itself.  We will leave follow-up on an as-needed basis.  Follow-up: Return if symptoms worsen or fail to improve.   Meds & Orders: No orders of the defined types were placed in this encounter.   Orders Placed This Encounter  Procedures   XR Toe Great Left     Procedures: No procedures performed      Clinical History: No specialty comments available.  He reports that he quit smoking about 33 years ago. His smoking use included cigarettes. He has never used smokeless tobacco.  Recent Labs    02/24/22 1437 09/01/22 1342  HGBA1C 5.4 5.4    Objective:   Vital Signs: There were no  vitals taken for this visit.  Physical Exam  Gen: Well-appearing, in no acute distress; non-toxic CV: Regular Rate. Well-perfused. Warm.  Resp: Breathing unlabored on room air; no wheezing. Psych: Fluid speech in conversation; appropriate affect; normal thought process Neuro: Sensation intact throughout. No gross coordination deficits.   Ortho Exam -Left great toe: There is no tenderness to palpation.  No bruising or swelling.  There is a bony biopsy of the proximal phalanx of the great toe.  Hallux rigidus, although this is present bilaterally.  Nailbed intact.  Neurovascularly intact distally.  Imaging: XR Toe Great Left  Result Date: 09/28/2022 3 views of the left great toe including AP, oblique and lateral films were ordered and reviewed by myself. X-rays redemonstrate a comminuted fracture of the proximal phalanx of the great toe with a small degree of dorsal displacement on the lateral view, although it AP view appears intact.  There is callus formation with signs of a healing fracture.  Mild bunion deformity.   Past Medical/Family/Surgical/Social History: Medications & Allergies reviewed per EMR, new medications updated. Patient Active Problem List   Diagnosis Date Noted   Great toe pain, left 09/01/2022   Bilateral shoulder pain 02/27/2022   Vitamin D deficiency 02/27/2022   External otitis of left ear 11/14/2019   COPD (chronic obstructive pulmonary disease) ([place]) 11/10/2018  UTI (urinary tract infection) 05/10/2018   Abnormal urine odor 08/23/2017   Cough 03/03/2017   Wheezing 03/03/2017   Paresthesias 11/09/2016   Trigger finger, acquired 11/09/2016   Leg cramps 11/09/2016   Fatigue 11/09/2016   Left lumbar radiculopathy 25/95/6387   Systolic CHF ([place]) 56/43/3295   Peripheral edema 03/04/2016   Macular scar of left eye 08/30/2015   Multiple thyroid nodules 06/20/2014   Rash and nonspecific skin eruption 05/09/2014   Prostate cancer ([place]) 05/09/2014    Hypersomnolence 05/08/2013   Personal history of colonic polyps 05/08/2013   Aortic root dilation ([place]) 05/08/2012   Hyperlipidemia 05/08/2012   Lumbar disc disease 05/08/2012   Left sided sciatica 05/08/2012   Impaired glucose tolerance 05/05/2012   Male stress incontinence 05/05/2012   Erectile dysfunction 05/05/2012   PUD (peptic ulcer disease) 05/05/2012   GERD (gastroesophageal reflux disease) 05/05/2012   Bladder calculus 05/05/2012   Preventative health care 05/05/2012   Anemia, unspecified    Anxiety    Arthritis of left knee    Cataract, nuclear 04/30/2012   Neurogenic pain of foot 05/03/2011   Primary open angle glaucoma of both eyes, severe stage 08/06/2009   Asthma 08/06/2009   PAIN IN JOINT PELVIC REGION AND THIGH 08/06/2009   ECHOCARDIOGRAM, ABNORMAL 12/26/2006   OBESITY 10/06/2006   Essential hypertension 10/06/2006   Past Medical History:  Diagnosis Date   Allergy    Anemia, unspecified    NOS ?resolved   Anxiety    Aortic root dilation (HCC)    Arthritis    ASTHMA, UNSPECIFIED, UNSPECIFIED STATUS 08/06/2009   Annotation: No exacerbations.  Qualifier: Diagnosis of  By: Kelton Pillar MD, Madhav     Bladder calculus 05/05/2012   Cataract    Chest pain    ECHOCARDIOGRAM, ABNORMAL 12/26/2006    Aortic root dilation  This problem surfaced in 2006 after a cardiology referral for chest pain.  He was seen by Dr. Haroldine Laws in January o6 and cathed on Jan. 18.  Coronaries were nl and EF was 65%.  Echo showed mild aortic root dilation of 27m. He was started on metoprolol (although I notice that he is no longer on this.  Annual echo to follow aortic root was advised.  Aortic root dimension was unchanged on studies in 2009 and 2010.  Will continue to follow this, perhaps not every year as it seems stable.    Erectile dysfunction 05/05/2012   GERD (gastroesophageal reflux disease) 05/05/2012   H pylori ulcer    teated H pylori   History of back surgery    Hyperlipidemia 05/08/2012    Hypertension    Impaired glucose tolerance 05/05/2012   Lumbar disc disease 05/08/2012   Male stress incontinence 05/05/2012   Myocardial infarction ([facility]    pt states, "i've been told I have had two heart attacks   Obesity    OBESITY 10/06/2006   Qualifier: Diagnosis of  By: RStann MainlandMD, SNicole Kindred    Prostate cancer ([facility]    Hx of    PROSTATE CANCER, HX OF 08/06/2009   Annotation: prostatectomy and radiotherapy in 1990s,  Dr. WJeffie PollockQualifier: Diagnosis of  By: DKelton PillarMD, Madhav     PUD (peptic ulcer disease) 61/88/4166  Systolic CHF ([place] 60/63/0160  [place]    UNSPECIFIED OPEN-ANGLE GLAUCOMA 08/06/2009   Annotation: Managed by opthalmologist Dr. BEdilia BoQualifier: Diagnosis of  By: DKelton PillarMD, Madhav     Family History  Problem Relation Age of Onset  Coronary artery disease Other    Hypertension Other    Stroke Other    Colon cancer Neg Hx    Esophageal cancer Neg Hx    Rectal cancer Neg Hx    Stomach cancer Neg Hx    Past Surgical History:  Procedure Laterality Date   BACK SURGERY     ear surgury     eye surgury     LEFT HEART CATH AND CORONARY ANGIOGRAPHY N/A 02/06/2018   Procedure: LEFT HEART CATH AND CORONARY ANGIOGRAPHY;  Surgeon: Larey Dresser, MD;  Location: [place] [facility];  Service: Cardiovascular;  Laterality: N/A;   PROSTATECTOMY     Social History   Occupational History   Occupation: Camera operator (Part-time)  Tobacco Use   Smoking status: Former    Types: Cigarettes    Quit date: 12/02/1988    Years since quitting: 33.8   Smokeless tobacco: Never  Vaping Use   Vaping Use: Never used  Substance and Sexual Activity   Alcohol use: No   Drug use: No   Sexual activity: Not on file

## 2022-09-28 NOTE — Progress Notes (Signed)
Doing good; In a regular shoe No pain

## 2022-10-12 DIAGNOSIS — Z961 Presence of intraocular lens: Secondary | ICD-10-CM | POA: Diagnosis not present

## 2022-10-12 DIAGNOSIS — H401133 Primary open-angle glaucoma, bilateral, severe stage: Secondary | ICD-10-CM | POA: Diagnosis not present

## 2022-10-12 DIAGNOSIS — H2511 Age-related nuclear cataract, right eye: Secondary | ICD-10-CM | POA: Diagnosis not present

## 2022-10-19 ENCOUNTER — Ambulatory Visit (HOSPITAL_COMMUNITY)
Admission: RE | Admit: 2022-10-19 | Discharge: 2022-10-19 | Disposition: A | Discharge status: Home / Self Care | Payer: Medicare Other | Source: Ambulatory Visit | Attending: Cardiology | Admitting: Cardiology

## 2022-10-19 DIAGNOSIS — I5022 Chronic systolic (congestive) heart failure: Secondary | ICD-10-CM | POA: Diagnosis not present

## 2022-10-19 DIAGNOSIS — I739 Peripheral vascular disease, unspecified: Secondary | ICD-10-CM | POA: Insufficient documentation

## 2022-11-25 ENCOUNTER — Telehealth: Payer: Self-pay | Admitting: Gastroenterology

## 2022-11-25 NOTE — Telephone Encounter (Signed)
Left message on machine to call back  

## 2022-11-25 NOTE — Telephone Encounter (Signed)
This needs to go to the DOD I believe.  Thank you

## 2022-11-25 NOTE — Telephone Encounter (Signed)
Inbound call from patient stating that he is having issues with internal hemorrhoids. Patient is requesting a call back to discuss. Please advise.

## 2022-11-25 NOTE — Telephone Encounter (Signed)
Dr. Loletha Carrow is DOD for today PM. Please see note below.

## 2022-11-29 NOTE — Telephone Encounter (Signed)
Unable to reach the pt by phone will await further communication from the pt.

## 2022-11-29 NOTE — Telephone Encounter (Signed)
Left message on machine to call back  

## 2023-01-12 ENCOUNTER — Other Ambulatory Visit: Payer: Self-pay | Admitting: Internal Medicine

## 2023-02-28 ENCOUNTER — Other Ambulatory Visit: Payer: Medicare Other

## 2023-02-28 ENCOUNTER — Other Ambulatory Visit: Payer: Self-pay | Admitting: Internal Medicine

## 2023-02-28 NOTE — Telephone Encounter (Signed)
Unfortunately we are unable to order lab today due to primary insurance being traditional medicare, which will not pay for this  Pt can only have labs covered on date of service or after  Last visit was April 2023, and no recommendation for lab appt was done at that visit for this reason

## 2023-03-04 ENCOUNTER — Ambulatory Visit: Payer: Medicare Other | Admitting: Internal Medicine

## 2023-03-07 ENCOUNTER — Encounter: Payer: Self-pay | Admitting: Internal Medicine

## 2023-03-07 ENCOUNTER — Ambulatory Visit (INDEPENDENT_AMBULATORY_CARE_PROVIDER_SITE_OTHER): Payer: Medicare Other | Admitting: Internal Medicine

## 2023-03-07 VITALS — BP 126/70 | HR 56 | Temp 98.0°F | Ht 75.0 in | Wt 252.0 lb

## 2023-03-07 DIAGNOSIS — E7800 Pure hypercholesterolemia, unspecified: Secondary | ICD-10-CM

## 2023-03-07 DIAGNOSIS — H6091 Unspecified otitis externa, right ear: Secondary | ICD-10-CM | POA: Insufficient documentation

## 2023-03-07 DIAGNOSIS — H60501 Unspecified acute noninfective otitis externa, right ear: Secondary | ICD-10-CM | POA: Diagnosis not present

## 2023-03-07 DIAGNOSIS — E559 Vitamin D deficiency, unspecified: Secondary | ICD-10-CM

## 2023-03-07 DIAGNOSIS — I10 Essential (primary) hypertension: Secondary | ICD-10-CM | POA: Diagnosis not present

## 2023-03-07 DIAGNOSIS — R7302 Impaired glucose tolerance (oral): Secondary | ICD-10-CM

## 2023-03-07 LAB — CBC WITH DIFFERENTIAL/PLATELET
Basophils Absolute: 0 10*3/uL (ref 0.0–0.1)
Basophils Relative: 0.6 % (ref 0.0–3.0)
Eosinophils Absolute: 0 10*3/uL (ref 0.0–0.7)
Eosinophils Relative: 1.1 % (ref 0.0–5.0)
HCT: 43.9 % (ref 39.0–52.0)
Hemoglobin: 14.7 g/dL (ref 13.0–17.0)
Lymphocytes Relative: 46.4 % — ABNORMAL HIGH (ref 12.0–46.0)
Lymphs Abs: 1.7 10*3/uL (ref 0.7–4.0)
MCHC: 33.5 g/dL (ref 30.0–36.0)
MCV: 99.1 fl (ref 78.0–100.0)
Monocytes Absolute: 0.4 10*3/uL (ref 0.1–1.0)
Monocytes Relative: 10.9 % (ref 3.0–12.0)
Neutro Abs: 1.5 10*3/uL (ref 1.4–7.7)
Neutrophils Relative %: 41 % — ABNORMAL LOW (ref 43.0–77.0)
Platelets: 169 10*3/uL (ref 150.0–400.0)
RBC: 4.43 Mil/uL (ref 4.22–5.81)
RDW: 12.4 % (ref 11.5–15.5)
WBC: 3.7 10*3/uL — ABNORMAL LOW (ref 4.0–10.5)

## 2023-03-07 LAB — HEMOGLOBIN A1C: Hgb A1c MFr Bld: 5.2 % (ref 4.6–6.5)

## 2023-03-07 LAB — TSH: TSH: 1.38 u[IU]/mL (ref 0.35–5.50)

## 2023-03-07 MED ORDER — CIPROFLOXACIN HCL 500 MG PO TABS: 500.0000 mg | ORAL_TABLET | Freq: Two times a day (BID) | ORAL | 0 refills | Status: AC

## 2023-03-07 NOTE — Progress Notes (Signed)
Patient ID: Dylan Benton, male   DOB: 04-20-1941, 82 y.o.   MRN: [id number]        Chief Complaint: follow up HTN, HLD and hyperglycemia , right ear pain, low vit d       HPI:  Dylan Benton is a 82 y.o. male here with c/o 2-3 days onset right ear pain, swelling and tender to touch but no fever, chills, drainage, Pt denies chest pain, increased sob or doe, wheezing, orthopnea, PND, increased LE swelling, palpitations, dizziness or syncope.   Pt denies polydipsia, polyuria, or new focal neuro s/s.    Pt denies fever, wt loss, night sweats, loss of appetite, or other constitutional symptoms  Denies urinary symptoms such as dysuria, frequency, urgency, flank pain, hematuria or n/v, fever, chills.  Getting PSA once per yr with urology Dr Annabell Howells.  Wt Readings from Last 3 Encounters:  03/07/23 252 lb (114.3 kg)  09/14/22 250 lb (113.4 kg)  09/09/22 252 lb 3.2 oz (114.4 kg)   BP Readings from Last 3 Encounters:  03/07/23 126/70  09/09/22 122/68  09/01/22 (!) 142/80         Past Medical History:  Diagnosis Date   Allergy    Anemia, unspecified    NOS ?resolved   Anxiety    Aortic root dilation    Arthritis    ASTHMA, UNSPECIFIED, UNSPECIFIED STATUS 08/06/2009   Annotation: No exacerbations.  Qualifier: Diagnosis of  By: Scot Dock MD, Madhav     Bladder calculus 05/05/2012   Cataract    Chest pain    ECHOCARDIOGRAM, ABNORMAL 12/26/2006    Aortic root dilation  This problem surfaced in 2006 after a cardiology referral for chest pain.  He was seen by Dr. Gala Romney in January o6 and cathed on Jan. 18.  Coronaries were nl and EF was 65%.  Echo showed mild aortic root dilation of 65mm. He was started on metoprolol (although I notice that he is no longer on this.  Annual echo to follow aortic root was advised.  Aortic root dimension was unchanged on studies in 2009 and 2010.  Will continue to follow this, perhaps not every year as it seems stable.    Erectile dysfunction 05/05/2012   GERD  (gastroesophageal reflux disease) 05/05/2012   H pylori ulcer    teated H pylori   History of back surgery    Hyperlipidemia 05/08/2012   Hypertension    Impaired glucose tolerance 05/05/2012   Lumbar disc disease 05/08/2012   Male stress incontinence 05/05/2012   Myocardial infarction    pt states, "i've been told I have had two heart attacks   Obesity    OBESITY 10/06/2006   Qualifier: Diagnosis of  By: Aundria Rud MD, Roseanne Reno     Prostate cancer    Hx of    PROSTATE CANCER, HX OF 08/06/2009   Annotation: prostatectomy and radiotherapy in 1990s,  Dr. Annabell Howells Qualifier: Diagnosis of  By: Scot Dock MD, Madhav     PUD (peptic ulcer disease) 05/05/2012   Systolic CHF 05/13/2016   Thoracic aortic aneurysm    UNSPECIFIED OPEN-ANGLE GLAUCOMA 08/06/2009   Annotation: Managed by opthalmologist Dr. Lottie Dawson Qualifier: Diagnosis of  By: Scot Dock MD, Madhav     Past Surgical History:  Procedure Laterality Date   BACK SURGERY     ear surgury     eye surgury     LEFT HEART CATH AND CORONARY ANGIOGRAPHY N/A 02/06/2018   Procedure: LEFT HEART CATH AND CORONARY ANGIOGRAPHY;  Surgeon: Marca Ancona  S, MD;  Location: [facility] INVASIVE CV LAB;  Service: Cardiovascular;  Laterality: N/A;   PROSTATECTOMY      reports that he quit smoking about 34 years ago. His smoking use included cigarettes. He has never used smokeless tobacco. He reports that he does not drink alcohol and does not use drugs. family history includes Coronary artery disease in an other family member; Hypertension in an other family member; Stroke in an other family member. No Known Allergies Current Outpatient Medications on File Prior to Visit  Medication Sig Dispense Refill   albuterol (VENTOLIN HFA) 108 (90 Base) MCG/ACT inhaler TAKE 2 PUFFS BY MOUTH EVERY 6 HOURS AS NEEDED FOR WHEEZE OR SHORTNESS OF BREATH 8.5 each 11   aspirin 81 MG EC tablet TAKE 1 TABLET BY MOUTH EVERY DAY 90 tablet 3   carvedilol (COREG) 6.25 MG tablet TAKE 1 TABLET BY MOUTH 2 TIMES  DAILY WITH A MEAL. 180 tablet 3   dorzolamide-timolol (COSOPT) 22.3-6.8 MG/ML ophthalmic solution Place 1 drop into both eyes 2 (two) times daily.     ENTRESTO 97-103 MG TAKE 1 TABLET BY MOUTH TWICE A DAY 180 tablet 3   FARXIGA 10 MG TABS tablet TAKE 1 TABLET (10 MG TOTAL) BY MOUTH DAILY BEFORE BREAKFAST. D/C 30 DAY SUPPLY SCRIPT 90 tablet 3   latanoprost (XALATAN) 0.005 % ophthalmic solution Place 1 drop into both eyes at bedtime.      meloxicam (MOBIC) 7.5 MG tablet TAKE 1 TABLET BY MOUTH EVERY DAY 90 tablet 1   rosuvastatin (CRESTOR) 10 MG tablet TAKE 1 TABLET BY MOUTH EVERY DAY 90 tablet 1   spironolactone (ALDACTONE) 25 MG tablet TAKE 1 TABLET BY MOUTH EVERY DAY 90 tablet 3   No current facility-administered medications on file prior to visit.        ROS:  All others reviewed and negative.  Objective        PE:  BP 126/70   Pulse (!) 56   Temp 98 F (36.7 C) (Oral)   Ht 6\' 3"  (1.905 m)   Wt 252 lb (114.3 kg)   SpO2 96%   BMI 31.50 kg/m                 Constitutional: Pt appears in NAD               HENT: Head: NCAT.                Right Ear: External ear normal.  Right canal with 2+ swelling, tender without drainage               Left Ear: External ear normal.                Eyes: . Pupils are equal, round, and reactive to light. Conjunctivae and EOM are normal               Nose: without d/c or deformity               Neck: Neck supple. Gross normal ROM               Cardiovascular: Normal rate and regular rhythm.                 Pulmonary/Chest: Effort normal and breath sounds without rales or wheezing.                Abd:  Soft, NT, ND, + BS, no organomegaly  Neurological: Pt is alert. At baseline orientation, motor grossly intact               Skin: Skin is warm. No rashes, no other new lesions, LE edema - none               Psychiatric: Pt behavior is normal without agitation   Micro: none  Cardiac tracings I have personally interpreted today:   none  Pertinent Radiological findings (summarize): none   Lab Results  Component Value Date   WBC 3.4 (L) 09/01/2022   HGB 14.5 09/01/2022   HCT 43.0 09/01/2022   PLT 160.0 09/01/2022   GLUCOSE 96 09/01/2022   CHOL 117 09/01/2022   TRIG 56.0 09/01/2022   HDL 50.90 09/01/2022   LDLCALC 55 09/01/2022   ALT 14 09/01/2022   AST 20 09/01/2022   NA 141 09/01/2022   K 4.6 09/01/2022   CL 103 09/01/2022   CREATININE 1.12 09/01/2022   BUN 12 09/01/2022   CO2 31 09/01/2022   TSH 1.65 09/01/2022   PSA 1.65 11/09/2017   INR 1.09 01/30/2018   HGBA1C 5.4 09/01/2022   Assessment/Plan:  Dylan Benton is a 82 y.o. Black or African American [2] male with  has a past medical history of Allergy, Anemia, unspecified, Anxiety, Aortic root dilation, Arthritis, ASTHMA, UNSPECIFIED, UNSPECIFIED STATUS (08/06/2009), Bladder calculus (05/05/2012), Cataract, Chest pain, ECHOCARDIOGRAM, ABNORMAL (12/26/2006), Erectile dysfunction (05/05/2012), GERD (gastroesophageal reflux disease) (05/05/2012), H pylori ulcer, History of back surgery, Hyperlipidemia (05/08/2012), Hypertension, Impaired glucose tolerance (05/05/2012), Lumbar disc disease (05/08/2012), Male stress incontinence (05/05/2012), Myocardial infarction, Obesity, OBESITY (10/06/2006), Prostate cancer, PROSTATE CANCER, HX OF (08/06/2009), PUD (peptic ulcer disease) (05/05/2012), Systolic CHF (05/13/2016), Thoracic aortic aneurysm, and UNSPECIFIED OPEN-ANGLE GLAUCOMA (08/06/2009).  Essential hypertension BP Readings from Last 3 Encounters:  03/07/23 126/70  09/09/22 122/68  09/01/22 (!) 142/80   Stable, pt to continue medical treatment coreg 6.25 bid   Hyperlipidemia Lab Results  Component Value Date   LDLCALC 55 09/01/2022   Stable, pt to continue current statin crestor 10 qd   Impaired glucose tolerance Lab Results  Component Value Date   HGBA1C 5.4 09/01/2022   Stable, pt to continue current medical treatment farxiga 10 d  Vitamin D  deficiency Last vitamin D Lab Results  Component Value Date   VD25OH 45.01 09/01/2022   Stable, cont oral replacement   External otitis of right ear Mild to mod, for antibx course cipro 500 bid,,  to f/u any worsening symptoms or concerns Followup: Return in about 6 months (around 09/06/2023).  Oliver Barre, MD 03/07/2023 8:43 PM [facility]

## 2023-03-07 NOTE — Assessment & Plan Note (Signed)
Last vitamin D Lab Results  Component Value Date   VD25OH 45.01 09/01/2022   Stable, cont oral replacement  

## 2023-03-07 NOTE — Patient Instructions (Signed)
Please take all new medication as prescribed - the antibiotic  Please continue all other medications as before, and refills have been done if requested.  Please have the pharmacy call with any other refills you may need.  Please continue your efforts at being more active, low cholesterol diet, and weight control.  Please keep your appointments with your specialists as you may have planned  Please go to the LAB at the blood drawing area for the tests to be done  You will be contacted by phone if any changes need to be made immediately.  Otherwise, you will receive a letter about your results with an explanation, but please check with MyChart first.  Please remember to sign up for MyChart if you have not done so, as this will be important to you in the future with finding out test results, communicating by private email, and scheduling acute appointments online when needed.  Please make an Appointment to return in 6 months, or sooner if needed 

## 2023-03-07 NOTE — Assessment & Plan Note (Signed)
BP Readings from Last 3 Encounters:  03/07/23 126/70  09/09/22 122/68  09/01/22 (!) 142/80   Stable, pt to continue medical treatment coreg 6.25 bid

## 2023-03-07 NOTE — Assessment & Plan Note (Signed)
Lab Results  Component Value Date   LDLCALC 55 09/01/2022   Stable, pt to continue current statin crestor 10 qd

## 2023-03-07 NOTE — Assessment & Plan Note (Signed)
Mild to mod, for antibx course cipro 500 bid,,  to f/u any worsening symptoms or concerns 

## 2023-03-07 NOTE — Assessment & Plan Note (Signed)
Lab Results  Component Value Date   HGBA1C 5.4 09/01/2022   Stable, pt to continue current medical treatment farxiga 10 d

## 2023-03-08 LAB — BASIC METABOLIC PANEL
BUN: 13 mg/dL (ref 6–23)
CO2: 30 mEq/L (ref 19–32)
Calcium: 9.7 mg/dL (ref 8.4–10.5)
Chloride: 102 mEq/L (ref 96–112)
Creatinine, Ser: 1.21 mg/dL (ref 0.40–1.50)
GFR: 55.16 mL/min — ABNORMAL LOW (ref >60.00)
Glucose, Bld: 94 mg/dL (ref 70–99)
Potassium: 4.9 mEq/L (ref 3.5–5.1)
Sodium: 139 mEq/L (ref 135–145)

## 2023-03-08 LAB — HEPATIC FUNCTION PANEL
ALT: 14 U/L (ref 0–53)
AST: 19 U/L (ref 0–37)
Albumin: 4.4 g/dL (ref 3.5–5.2)
Alkaline Phosphatase: 58 U/L (ref 39–117)
Bilirubin, Direct: 0.2 mg/dL (ref 0.0–0.3)
Total Bilirubin: 0.8 mg/dL (ref 0.2–1.2)
Total Protein: 7.1 g/dL (ref 6.0–8.3)

## 2023-03-08 LAB — LIPID PANEL
Cholesterol: 118 mg/dL (ref 0–200)
HDL: 51.6 mg/dL (ref >39.00)
LDL Cholesterol: 53 mg/dL (ref 0–99)
NonHDL: 66.77
Total CHOL/HDL Ratio: 2
Triglycerides: 68 mg/dL (ref 0.0–149.0)
VLDL: 13.6 mg/dL (ref 0.0–40.0)

## 2023-03-08 LAB — URINALYSIS, ROUTINE W REFLEX MICROSCOPIC
Bilirubin Urine: NEGATIVE
Hgb urine dipstick: NEGATIVE
Ketones, ur: NEGATIVE
Leukocytes,Ua: NEGATIVE
Nitrite: POSITIVE — AB
RBC / HPF: NONE SEEN (ref 0–?)
Specific Gravity, Urine: 1.02 (ref 1.000–1.030)
Total Protein, Urine: NEGATIVE
Urine Glucose: >=1000 — AB
Urobilinogen, UA: 1 (ref 0.0–1.0)
pH: 6 (ref 5.0–8.0)

## 2023-03-08 NOTE — Progress Notes (Signed)
The test results show that your current treatment is OK, as the tests are stable.  Please continue the same plan.  There is no other need for change of treatment or further evaluation based on these results, at this time.  thanks 

## 2023-03-09 ENCOUNTER — Telehealth: Payer: Self-pay | Admitting: Internal Medicine

## 2023-03-09 ENCOUNTER — Ambulatory Visit (INDEPENDENT_AMBULATORY_CARE_PROVIDER_SITE_OTHER): Payer: Medicare Other | Admitting: Internal Medicine

## 2023-03-09 VITALS — BP 138/68 | HR 61 | Temp 97.7°F | Ht 75.0 in | Wt 251.5 lb

## 2023-03-09 DIAGNOSIS — H60501 Unspecified acute noninfective otitis externa, right ear: Secondary | ICD-10-CM | POA: Diagnosis not present

## 2023-03-09 DIAGNOSIS — T7840XA Allergy, unspecified, initial encounter: Secondary | ICD-10-CM

## 2023-03-09 DIAGNOSIS — R7302 Impaired glucose tolerance (oral): Secondary | ICD-10-CM

## 2023-03-09 DIAGNOSIS — E559 Vitamin D deficiency, unspecified: Secondary | ICD-10-CM | POA: Diagnosis not present

## 2023-03-09 MED ORDER — AMOXICILLIN-POT CLAVULANATE 875-125 MG PO TABS: 1.0000 | ORAL_TABLET | Freq: Two times a day (BID) | ORAL | 0 refills | Status: DC

## 2023-03-09 MED ORDER — METHYLPREDNISOLONE ACETATE 80 MG/ML IJ SUSP
80.0000 mg | Freq: Once | INTRAMUSCULAR | Status: AC
  Administered 2023-03-09: 80 mg via INTRAMUSCULAR

## 2023-03-09 MED ORDER — PREDNISONE 10 MG PO TABS: ORAL_TABLET | ORAL | 0 refills | Status: DC

## 2023-03-09 NOTE — Patient Instructions (Signed)
You had the steroid shot today  Please take all new medication as prescribed - the different antibiotic for the ear (augmentin), and prednisone  Please continue all other medications as before, and refills have been done if requested.  Please have the pharmacy call with any other refills you may need.  Please keep your appointments with your specialists as you may have planned

## 2023-03-09 NOTE — Progress Notes (Signed)
Patient ID: Dylan Benton, male   DOB: 03-06-1941, 82 y.o.   MRN: [id number]        Chief Complaint: follow up bilateral eye swelling with itching, right ear pain persists, hyperglycemia, low vit d       HPI:  Dylan Benton is a 82 y.o. male here with c/o 2 days onset marked itching and mild swelling about the eyes and weepiness drainage clearish fluid, without pain, fever, ST, cough and Pt denies chest pain, increased sob or doe, wheezing, orthopnea, PND, increased LE swelling, palpitations, dizziness or syncope.   Pt denies polydipsia, polyuria, or new focal neuro s/s.    Pt denies fever, wt loss, night sweats, loss of appetite, or other constitutional symptoms  Also feels the antibiotic pills are too large and unable to take - asks for antibiotic change as right ear pain persists.         Wt Readings from Last 3 Encounters:  03/09/23 251 lb 8 oz (114.1 kg)  03/07/23 252 lb (114.3 kg)  09/14/22 250 lb (113.4 kg)   BP Readings from Last 3 Encounters:  03/09/23 138/68  03/07/23 126/70  09/09/22 122/68         Past Medical History:  Diagnosis Date   Allergy    Anemia, unspecified    NOS ?resolved   Anxiety    Aortic root dilation    Arthritis    ASTHMA, UNSPECIFIED, UNSPECIFIED STATUS 08/06/2009   Annotation: No exacerbations.  Qualifier: Diagnosis of  By: Scot Dock MD, Madhav     Bladder calculus 05/05/2012   Cataract    Chest pain    ECHOCARDIOGRAM, ABNORMAL 12/26/2006    Aortic root dilation  This problem surfaced in 2006 after a cardiology referral for chest pain.  He was seen by Dr. Gala Romney in January o6 and cathed on Jan. 18.  Coronaries were nl and EF was 65%.  Echo showed mild aortic root dilation of 41mm. He was started on metoprolol (although I notice that he is no longer on this.  Annual echo to follow aortic root was advised.  Aortic root dimension was unchanged on studies in 2009 and 2010.  Will continue to follow this, perhaps not every year as it seems stable.    Erectile  dysfunction 05/05/2012   GERD (gastroesophageal reflux disease) 05/05/2012   H pylori ulcer    teated H pylori   History of back surgery    Hyperlipidemia 05/08/2012   Hypertension    Impaired glucose tolerance 05/05/2012   Lumbar disc disease 05/08/2012   Male stress incontinence 05/05/2012   Myocardial infarction    pt states, "i've been told I have had two heart attacks   Obesity    OBESITY 10/06/2006   Qualifier: Diagnosis of  By: Aundria Rud MD, Roseanne Reno     Prostate cancer    Hx of    PROSTATE CANCER, HX OF 08/06/2009   Annotation: prostatectomy and radiotherapy in 1990s,  Dr. Annabell Howells Qualifier: Diagnosis of  By: Scot Dock MD, Madhav     PUD (peptic ulcer disease) 05/05/2012   Systolic CHF 05/13/2016   Thoracic aortic aneurysm    UNSPECIFIED OPEN-ANGLE GLAUCOMA 08/06/2009   Annotation: Managed by opthalmologist Dr. Lottie Dawson Qualifier: Diagnosis of  By: Scot Dock MD, Madhav     Past Surgical History:  Procedure Laterality Date   BACK SURGERY     ear surgury     eye surgury     LEFT HEART CATH AND CORONARY ANGIOGRAPHY N/A 02/06/2018  Procedure: LEFT HEART CATH AND CORONARY ANGIOGRAPHY;  Surgeon: Laurey Morale, MD;  Location: [facility] INVASIVE CV LAB;  Service: Cardiovascular;  Laterality: N/A;   PROSTATECTOMY      reports that he quit smoking about 34 years ago. His smoking use included cigarettes. He has never used smokeless tobacco. He reports that he does not drink alcohol and does not use drugs. family history includes Coronary artery disease in an other family member; Hypertension in an other family member; Stroke in an other family member. Allergies  Allergen Reactions   Ciprofloxacin Other (See Comments)    Bilateral eye swelling   Sulfa Antibiotics Itching    Arms itching   Current Outpatient Medications on File Prior to Visit  Medication Sig Dispense Refill   albuterol (VENTOLIN HFA) 108 (90 Base) MCG/ACT inhaler TAKE 2 PUFFS BY MOUTH EVERY 6 HOURS AS NEEDED FOR WHEEZE OR SHORTNESS OF  BREATH 8.5 each 11   aspirin 81 MG EC tablet TAKE 1 TABLET BY MOUTH EVERY DAY 90 tablet 3   carvedilol (COREG) 6.25 MG tablet TAKE 1 TABLET BY MOUTH 2 TIMES DAILY WITH A MEAL. 180 tablet 3   dorzolamide-timolol (COSOPT) 22.3-6.8 MG/ML ophthalmic solution Place 1 drop into both eyes 2 (two) times daily.     ENTRESTO 97-103 MG TAKE 1 TABLET BY MOUTH TWICE A DAY 180 tablet 3   FARXIGA 10 MG TABS tablet TAKE 1 TABLET (10 MG TOTAL) BY MOUTH DAILY BEFORE BREAKFAST. D/C 30 DAY SUPPLY SCRIPT 90 tablet 3   latanoprost (XALATAN) 0.005 % ophthalmic solution Place 1 drop into both eyes at bedtime.      meloxicam (MOBIC) 7.5 MG tablet TAKE 1 TABLET BY MOUTH EVERY DAY 90 tablet 1   rosuvastatin (CRESTOR) 10 MG tablet TAKE 1 TABLET BY MOUTH EVERY DAY 90 tablet 1   spironolactone (ALDACTONE) 25 MG tablet TAKE 1 TABLET BY MOUTH EVERY DAY 90 tablet 3   ciprofloxacin (CIPRO) 500 MG tablet Take 1 tablet (500 mg total) by mouth 2 (two) times daily for 10 days. (Patient not taking: Reported on 03/09/2023) 20 tablet 0   No current facility-administered medications on file prior to visit.        ROS:  All others reviewed and negative.  Objective        PE:  BP 138/68   Pulse 61   Temp 97.7 F (36.5 C) (Temporal)   Ht  (1.905 m)   Wt 251 lb 8 oz (114.1 kg)   SpO2 96%   BMI 31.44 kg/m                 Constitutional: Pt appears in NAD, non toxic               HENT: Head: NCAT.                Right Ear: External ear normal.  Right ext canal with !-2+ swelling, tender redness               Left Ear: External ear normal.                Eyes: . Pupils are equal, round, and reactive to light. Conjunctivae and EOM are normal but has 1-2 + periorbital swelling                Nose: without d/c or deformity               Neck: Neck supple. Gross normal ROM  Cardiovascular: Normal rate and regular rhythm.                 Pulmonary/Chest: Effort normal and breath sounds without rales or wheezing.                                Neurological: Pt is alert. At baseline orientation, motor grossly intact               Skin: Skin is warm. No rashes, no other new lesions, LE edema - none               Psychiatric: Pt behavior is normal without agitation   Micro: none  Cardiac tracings I have personally interpreted today:  none  Pertinent Radiological findings (summarize): none   Lab Results  Component Value Date   WBC 3.7 (L) 03/07/2023   HGB 14.7 03/07/2023   HCT 43.9 03/07/2023   PLT 169.0 03/07/2023   GLUCOSE 94 03/07/2023   CHOL 118 03/07/2023   TRIG 68.0 03/07/2023   HDL 51.60 03/07/2023   LDLCALC 53 03/07/2023   ALT 14 03/07/2023   AST 19 03/07/2023   NA 139 03/07/2023   K 4.9 03/07/2023   CL 102 03/07/2023   CREATININE 1.21 03/07/2023   BUN 13 03/07/2023   CO2 30 03/07/2023   TSH 1.38 03/07/2023   PSA 1.65 11/09/2017   INR 1.09 01/30/2018   HGBA1C 5.2 03/07/2023   Assessment/Plan:  TARAS RASK is a 82 y.o. Black or African American [2] male with  has a past medical history of Allergy, Anemia, unspecified, Anxiety, Aortic root dilation, Arthritis, ASTHMA, UNSPECIFIED, UNSPECIFIED STATUS (08/06/2009), Bladder calculus (05/05/2012), Cataract, Chest pain, ECHOCARDIOGRAM, ABNORMAL (12/26/2006), Erectile dysfunction (05/05/2012), GERD (gastroesophageal reflux disease) (05/05/2012), H pylori ulcer, History of back surgery, Hyperlipidemia (05/08/2012), Hypertension, Impaired glucose tolerance (05/05/2012), Lumbar disc disease (05/08/2012), Male stress incontinence (05/05/2012), Myocardial infarction, Obesity, OBESITY (10/06/2006), Prostate cancer, PROSTATE CANCER, HX OF (08/06/2009), PUD (peptic ulcer disease) (05/05/2012), Systolic CHF (05/13/2016), Thoracic aortic aneurysm, and UNSPECIFIED OPEN-ANGLE GLAUCOMA (08/06/2009).  Impaired glucose tolerance Lab Results  Component Value Date   HGBA1C 5.2 03/07/2023   Stable, pt to continue current medical treatment farxiga 10 mg qd   Allergic  reaction Mild to mod, for depomedrol 80 mg IM, prednisone asd,  to f/u any worsening symptoms or concerns  External otitis of right ear Ok for change to augmentin bid course, asd,  to f/u any worsening symptoms or concerns  Vitamin D deficiency Last vitamin D Lab Results  Component Value Date   VD25OH 45.01 09/01/2022   Stable, cont oral replacement  Followup: Return if symptoms worsen or fail to improve.  Oliver Barre, MD 03/11/2023 9:45 PM [facility]

## 2023-03-11 ENCOUNTER — Encounter: Payer: Self-pay | Admitting: Internal Medicine

## 2023-03-11 NOTE — Assessment & Plan Note (Signed)
Ok for change to augmentin bid course, asd,  to f/u any worsening symptoms or concerns

## 2023-03-11 NOTE — Assessment & Plan Note (Signed)
Lab Results  Component Value Date   HGBA1C 5.2 03/07/2023   Stable, pt to continue current medical treatment farxiga 10 mg qd

## 2023-03-11 NOTE — Assessment & Plan Note (Signed)
Last vitamin D Lab Results  Component Value Date   VD25OH 45.01 09/01/2022   Stable, cont oral replacement  

## 2023-03-11 NOTE — Assessment & Plan Note (Signed)
Mild to mod, for depomedrol 80 mg IM, prednisone asd,  to f/u any worsening symptoms or concerns

## 2023-03-16 DIAGNOSIS — H401133 Primary open-angle glaucoma, bilateral, severe stage: Secondary | ICD-10-CM | POA: Diagnosis not present

## 2023-03-23 ENCOUNTER — Ambulatory Visit (HOSPITAL_BASED_OUTPATIENT_CLINIC_OR_DEPARTMENT_OTHER)
Admission: RE | Admit: 2023-03-23 | Discharge: 2023-03-23 | Disposition: A | Discharge status: Home / Self Care | Payer: Medicare Other | Source: Ambulatory Visit | Attending: Cardiology | Admitting: Cardiology

## 2023-03-23 ENCOUNTER — Ambulatory Visit (HOSPITAL_COMMUNITY)
Admission: RE | Admit: 2023-03-23 | Discharge: 2023-03-23 | Disposition: A | Discharge status: Home / Self Care | Payer: Medicare Other | Source: Ambulatory Visit | Attending: Internal Medicine | Admitting: Internal Medicine

## 2023-03-23 ENCOUNTER — Encounter (HOSPITAL_COMMUNITY): Payer: Self-pay | Admitting: Cardiology

## 2023-03-23 VITALS — BP 102/60 | HR 50 | Wt 244.4 lb

## 2023-03-23 DIAGNOSIS — I110 Hypertensive heart disease with heart failure: Secondary | ICD-10-CM | POA: Insufficient documentation

## 2023-03-23 DIAGNOSIS — I739 Peripheral vascular disease, unspecified: Secondary | ICD-10-CM | POA: Insufficient documentation

## 2023-03-23 DIAGNOSIS — E785 Hyperlipidemia, unspecified: Secondary | ICD-10-CM | POA: Insufficient documentation

## 2023-03-23 DIAGNOSIS — I5022 Chronic systolic (congestive) heart failure: Secondary | ICD-10-CM

## 2023-03-23 DIAGNOSIS — I2510 Atherosclerotic heart disease of native coronary artery without angina pectoris: Secondary | ICD-10-CM | POA: Diagnosis not present

## 2023-03-23 DIAGNOSIS — Z8249 Family history of ischemic heart disease and other diseases of the circulatory system: Secondary | ICD-10-CM | POA: Diagnosis not present

## 2023-03-23 DIAGNOSIS — Z87891 Personal history of nicotine dependence: Secondary | ICD-10-CM | POA: Insufficient documentation

## 2023-03-23 DIAGNOSIS — Z79899 Other long term (current) drug therapy: Secondary | ICD-10-CM | POA: Insufficient documentation

## 2023-03-23 DIAGNOSIS — I428 Other cardiomyopathies: Secondary | ICD-10-CM | POA: Insufficient documentation

## 2023-03-23 DIAGNOSIS — J449 Chronic obstructive pulmonary disease, unspecified: Secondary | ICD-10-CM | POA: Diagnosis not present

## 2023-03-23 LAB — ECHOCARDIOGRAM COMPLETE
Area-P 1/2: 1.39 cm2
Calc EF: 45 %
S' Lateral: 3.8 cm
Single Plane A2C EF: 47.1 %
Single Plane A4C EF: 43.6 %

## 2023-03-23 NOTE — Patient Instructions (Signed)
Good to see you today!  No changes to your medications  Your physician recommends that you schedule a follow-up appointment in: 4 months(September) Call office in July to schedule an appointment.  If you have any questions or concerns before your next appointment please send [place] a message through [place] or call our office at [phone].    TO LEAVE A MESSAGE FOR THE NURSE SELECT OPTION 2, PLEASE LEAVE A MESSAGE INCLUDING: YOUR NAME DATE OF BIRTH CALL BACK NUMBER REASON FOR CALL**this is important as we prioritize the call backs  YOU WILL RECEIVE A CALL BACK THE SAME DAY AS LONG AS YOU CALL BEFORE 4:00 PM  At the [facility], you and your health needs are our priority. As part of our continuing mission to provide you with exceptional heart care, we have created designated Provider Care Teams. These Care Teams include your primary Cardiologist (physician) and Advanced Practice Providers (APPs- Physician Assistants and Nurse Practitioners) who all work together to provide you with the care you need, when you need it.   You may see any of the following providers on your designated Care Team at your next follow up: Dr Arvilla Meres Dr Marca Ancona Dr. Marcos Eke, NP Robbie Lis, [place] [facility] [place], [place] Brynda Peon, NP Karle Plumber, PharmD   Please be sure to bring in all your medications bottles to every appointment.    Thank you for choosing  [facility]

## 2023-03-24 NOTE — Progress Notes (Signed)
ID:  Dylan Benton, DOB 1941-06-01, MRN [id number]   Provider location: [facility] Type of Visit: Established patient   PCP:  Corwin Levins, MD  Cardiologist:  Nanetta Batty, MD Primary HF: Dr. Shirlee Latch   History of Present Illness: Dylan Benton is a 82 y.o. male who has a history of HTN and cardiomyopathy of uncertain etiology.  He was referred by Dr. Allyson Sabal for evaluation of dyspnea and CHF.  Patient was first found to have decreased systolic function in 1/17 when echo showed EF 45-50%. At that time, he had mild exertional dyspnea. Last summer, he had gotten to the point where he had to take frequent breaks mowing the lawn.  However, since the beginning of this year, he has had worsening dyspnea.  Echo was done in 1/19, showing EF down to 35-40%.  Cardiolite did not show a definite perfusion defect. 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 Cardiac MRI in 4/19 showed EF 46%, LGE pattern that was concerning for myocarditis. PYP scan was negative.   Echo in 7/20 showed  EF 45%, RV normal.  Echo in 9/21 showed EF 45-50%, diffuse hypokinesis with normal RV.  Echo in 9/22 showed EF 50-55%.   Zio patch x 7 days in 10/21 showed no significant arrhythmia.   Echo was done today and reviewed, EF 45-50%, normal RV, IVC normal.   He returns for followup of CHF.   Weight is down 8 lbs.  Mild lightheadedness if he stands too fast.  SBP generally in 110s range at home.  No chest pain.  He walks with a cane for balance.  He is able to mow his grass on a riding lawnmower.  No dyspnea walking on flat ground.  No orthopnea/PND.  He fatigues more easily than in the past.   ECG (personally reviewed): NSR, rate 53  Labs (12/18): LDL 47 Labs (3/19): K 4.3, creatinine 1.09 Labs (4/19): Myeloma pattern negative Labs (5/19): K 4.6, creatinine 1.06 Labs (6/19): LDL 48, HDl 42, K 4, creatinine 1.15 Labs (6/20): LDL 51, HDl 37, K 4.4, creatinine 8.46, LFTs normal, TSH normal Labs  (12/20): LDL 64, HDL 43 Labs (3/21): K 4.1, creatinine 0.98 Labs (6/21): K 4.1, creatinine 1.17 Labs (9/21): K 4.2, creatinine 1.0 Labs (3/22): K 4.1, creatinine 1.1, LDL 48, B12 normal Labs (5/22): K 4.2, creatinine 1.08 Labs (10/23): K 4.6, creatinine 1.12, LDL 55 Labs (4/24): K 4.9, creatinine 1.21, TSH normal, hgb 14.7, LDL 53, TGs 68  PMH: 1. Low back pain 2. Cardiomyopathy: LHC in 2006 with normal coronaries.  Echo in 1/17 with EF 45-50%.  - Echo (1/19): EF 35-40%, mild LVH - Cardiolite (2/19): EF 41%, no ischemia/infarction.  - LHC (3/19): 50% ostial LAD, 50% ramus. LVEDP 12.  - Cardiac MRI (4/19): EF 46%, normal RV size and systolic function, LGE pattern suggestive of prior myocarditis.  - PYP scan (4/19): Negative, not suggestive of TTR amyloidosis. - Myeloma panel negative - Echo (7/20): EF 45%, RV normal, IVC normal - Echo (9/21): EF 45-50%, diffuse hypokinesis, normal RV, IVC normal.  - Echo (9/22): EF 50-55%.  - Echo (4/24): EF 45-50%, normal RV, IVC normal. 3. HTN 4. Hyperlipidemia 5. CAD: LHC (3/19) with 50% ostial LAD, 50% ramus. 6. COPD: Prior smoker.  PFTs 11/19 with moderate obstruction.  7. Vasovagal syncope/presyncope.  8. PAD: peripheral arterial dopplers (3/21) showed occluded right AT artery, 30-49% left SFA.  - ABIs (11/23): Minimal PAD.  9.  Zio patch (10/21): x 7 days, no significant arrhythmias.  10. Peripheral neuropathy  Social History   Socioeconomic History   Marital status: Married    Spouse name: Not on file   Number of children: 2   Years of education: 14   Highest education level: Not on file  Occupational History   Occupation: Lobbyist (Part-time)  Tobacco Use   Smoking status: Former    Types: Cigarettes    Quit date: 12/02/1988    Years since quitting: 34.3   Smokeless tobacco: Never  Vaping Use   Vaping Use: Never used  Substance and Sexual Activity   Alcohol use: No   Drug use: No   Sexual activity: Not on file   Other Topics Concern   Not on file  Social History Narrative   Works for [employer] at [employer] in [place].   Social Determinants of Health   Financial Resource Strain: Low Risk  (04/07/2022)   Overall Financial Resource Strain (CARDIA)    Difficulty of Paying Living Expenses: Not hard at all  Food Insecurity: No Food Insecurity (04/07/2022)   Hunger Vital Sign    Worried About Running Out of Food in the Last Year: Never true    Ran Out of Food in the Last Year: Never true  Transportation Needs: No Transportation Needs (04/07/2022)   PRAPARE - Administrator, Civil Service (Medical): No    Lack of Transportation (Non-Medical): No  Physical Activity: Sufficiently Active (04/07/2022)   Exercise Vital Sign    Days of Exercise per Week: 5 days    Minutes of Exercise per Session: 30 min  Stress: No Stress Concern Present (04/07/2022)   [employer] of Occupational Health - Occupational Stress Questionnaire    Feeling of Stress : Not at all  Social Connections: Socially Integrated (04/07/2022)   Social Connection and Isolation Panel [NHANES]    Frequency of Communication with Friends and Family: More than three times a week    Frequency of Social Gatherings with Friends and Family: More than three times a week    Attends Religious Services: More than 4 times per year    Active Member of [employer] or Organizations: Yes    Attends Engineer, structural: More than 4 times per year    Marital Status: Married  Catering manager Violence: Not At Risk (04/07/2022)   Humiliation, Afraid, Rape, and Kick questionnaire    Fear of Current or Ex-Partner: No    Emotionally Abused: No    Physically Abused: No    Sexually Abused: No   Family History  Problem Relation Age of Onset   Coronary artery disease Other    Hypertension Other    Stroke Other    Colon cancer Neg Hx    Esophageal cancer Neg Hx    Rectal cancer Neg Hx    Stomach cancer Neg Hx    ROS: All systems reviewed and  negative except as per HPI.  Current Outpatient Medications  Medication Sig Dispense Refill   albuterol (VENTOLIN HFA) 108 (90 Base) MCG/ACT inhaler TAKE 2 PUFFS BY MOUTH EVERY 6 HOURS AS NEEDED FOR WHEEZE OR SHORTNESS OF BREATH 8.5 each 11   aspirin 81 MG EC tablet TAKE 1 TABLET BY MOUTH EVERY DAY 90 tablet 3   carvedilol (COREG) 6.25 MG tablet TAKE 1 TABLET BY MOUTH 2 TIMES DAILY WITH A MEAL. 180 tablet 3   dorzolamide-timolol (COSOPT) 22.3-6.8 MG/ML ophthalmic solution Place 1 drop into both eyes 2 (two)  times daily.     ENTRESTO 97-103 MG TAKE 1 TABLET BY MOUTH TWICE A DAY 180 tablet 3   FARXIGA 10 MG TABS tablet TAKE 1 TABLET (10 MG TOTAL) BY MOUTH DAILY BEFORE BREAKFAST. D/C 30 DAY SUPPLY SCRIPT 90 tablet 3   latanoprost (XALATAN) 0.005 % ophthalmic solution Place 1 drop into both eyes at bedtime.      rosuvastatin (CRESTOR) 10 MG tablet TAKE 1 TABLET BY MOUTH EVERY DAY 90 tablet 1   spironolactone (ALDACTONE) 25 MG tablet TAKE 1 TABLET BY MOUTH EVERY DAY 90 tablet 3   No current facility-administered medications for this encounter.   BP 102/60   Pulse (!) 50   Wt 110.9 kg (244 lb 6.4 oz)   SpO2 98%   BMI 30.55 kg/m  General: NAD Neck: No JVD, no thyromegaly or thyroid nodule.  Lungs: Clear to auscultation bilaterally with normal respiratory effort. CV: Nondisplaced PMI.  Heart regular S1/S2, no S3/S4, no murmur.  No peripheral edema.  No carotid bruit.  Difficult to palpate pedal pulses.  Abdomen: Soft, nontender, no hepatosplenomegaly, no distention.  Skin: Intact without lesions or rashes.  Neurologic: Alert and oriented x 3.  Psych: Normal affect. Extremities: No clubbing or cyanosis.  HEENT: Normal.   Assessment/Plan: 1. Chronic systolic CHF: Echo in 1/19 with EF 35-40%, mild LVH. LHC in 3/19 with nonobstructive CAD.  Cardiac MRI in 4/19 showed EF 46%, LGE pattern concerning for prior myocarditis. PYP scan was negative, not suggestive of TTR amyloidosis. Myeloma panel  negative.  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 Echo in 7/20 showed EF remains 45%, echo in 9/21 with EF 45-50%. Echo in 9/22 showed EF 50-55%.   Echo today showed EF 45-50%, normal RV, IVC normal.  I think LV function has remained stable.  NYHA class II symptoms, stable. He is not volume overloaded on exam. - Continue Coreg 6.25 mg bid.  With h/o bradycardia, will not increase dose.  - Continue Jardiance 10 mg daily.  - Continue Entresto 97/103 mg bid. - He takes Lasix prn, has not needed.  - Continue spironolactone 25 mg daily, recent BMET was stable.  2. HTN: BP not elevated.  3. Hyperlipidemia.  Continue Crestor, good lipids in 4/24.     4. COPD: Moderate by PFTs.  5. PAD: Minimal disease on 11/23 ABIs.   - Continue statin, ASA.   Followup in 4 months with APP.   Signed, Marca Ancona, MD  03/24/2023  [facility] [address] [facility] [place] [address] [phone] (office) [phone] (fax)

## 2023-03-28 ENCOUNTER — Telehealth: Payer: Self-pay | Admitting: Internal Medicine

## 2023-03-28 NOTE — Telephone Encounter (Signed)
Contacted Dylan Benton to schedule their annual wellness visit. Appointment made for 04/12/2023.  [facility] Care Guide [facility] AWV TEAM Direct Dial: [phone]

## 2023-04-11 ENCOUNTER — Other Ambulatory Visit (HOSPITAL_COMMUNITY): Payer: Self-pay | Admitting: Cardiology

## 2023-04-12 ENCOUNTER — Ambulatory Visit (INDEPENDENT_AMBULATORY_CARE_PROVIDER_SITE_OTHER): Payer: Medicare Other

## 2023-04-12 VITALS — Ht 75.0 in | Wt 250.0 lb

## 2023-04-12 DIAGNOSIS — Z0000 Encounter for general adult medical examination without abnormal findings: Secondary | ICD-10-CM

## 2023-04-12 NOTE — Patient Instructions (Signed)
Dylan Benton , Thank you for taking time to come for your Medicare Wellness Visit. I appreciate your ongoing commitment to your health goals. Please review the following plan we discussed and let me know if I can assist you in the future.   These are the goals we discussed:  Goals      My goal for 2024 is to evaluate my left side hip pain.        This is a list of the screening recommended for you and due dates:  Health Maintenance  Topic Date Due   Zoster (Shingles) Vaccine (1 of 2) Never done   DTaP/Tdap/Td vaccine (2 - Td or Tdap) 04/22/1986   COVID-19 Vaccine (5 - 2023-24 season) 11/13/2022   Medicare Annual Wellness Visit  04/11/2024   Colon Cancer Screening  09/16/2026   Pneumonia Vaccine  Completed   HPV Vaccine  Aged Out   Flu Shot  Discontinued    Advanced directives: No  Conditions/risks identified: Yes  Next appointment: Follow up in one year for your annual wellness visit.   Preventive Care 31 Years and Older, Male  Preventive care refers to lifestyle choices and visits with your health care provider that can promote health and wellness. What does preventive care include? A yearly physical exam. This is also called an annual well check. Dental exams once or twice a year. Routine eye exams. Ask your health care provider how often you should have your eyes checked. Personal lifestyle choices, including: Daily care of your teeth and gums. Regular physical activity. Eating a healthy diet. Avoiding tobacco and drug use. Limiting alcohol use. Practicing safe sex. Taking low doses of aspirin every day. Taking vitamin and mineral supplements as recommended by your health care provider. What happens during an annual well check? The services and screenings done by your health care provider during your annual well check will depend on your age, overall health, lifestyle risk factors, and family history of disease. Counseling  Your health care provider may ask you  questions about your: Alcohol use. Tobacco use. Drug use. Emotional well-being. Home and relationship well-being. Sexual activity. Eating habits. History of falls. Memory and ability to understand (cognition). Work and work Astronomer. Screening  You may have the following tests or measurements: Height, weight, and BMI. Blood pressure. Lipid and cholesterol levels. These may be checked every 5 years, or more frequently if you are over 79 years old. Skin check. Lung cancer screening. You may have this screening every year starting at age 37 if you have a 30-pack-year history of smoking and currently smoke or have quit within the past 15 years. Fecal occult blood test (FOBT) of the stool. You may have this test every year starting at age 46. Flexible sigmoidoscopy or colonoscopy. You may have a sigmoidoscopy every 5 years or a colonoscopy every 10 years starting at age 4. Prostate cancer screening. Recommendations will vary depending on your family history and other risks. Hepatitis C blood test. Hepatitis B blood test. Sexually transmitted disease (STD) testing. Diabetes screening. This is done by checking your blood sugar (glucose) after you have not eaten for a while (fasting). You may have this done every 1-3 years. Abdominal aortic aneurysm (AAA) screening. You may need this if you are a current or former smoker. Osteoporosis. You may be screened starting at age 42 if you are at high risk. Talk with your health care provider about your test results, treatment options, and if necessary, the need for more tests. Vaccines  Your health care provider may recommend certain vaccines, such as: Influenza vaccine. This is recommended every year. Tetanus, diphtheria, and acellular pertussis (Tdap, Td) vaccine. You may need a Td booster every 10 years. Zoster vaccine. You may need this after age 13. Pneumococcal 13-valent conjugate (PCV13) vaccine. One dose is recommended after age  63. Pneumococcal polysaccharide (PPSV23) vaccine. One dose is recommended after age 68. Talk to your health care provider about which screenings and vaccines you need and how often you need them. This information is not intended to replace advice given to you by your health care provider. Make sure you discuss any questions you have with your health care provider. Document Released: 12/05/2015 Document Revised: 07/28/2016 Document Reviewed: 09/09/2015 Elsevier Interactive Patient Education  2017 [employer].  Fall Prevention in the Home Falls can cause injuries. They can happen to people of all ages. There are many things you can do to make your home safe and to help prevent falls. What can I do on the outside of my home? Regularly fix the edges of walkways and driveways and fix any cracks. Remove anything that might make you trip as you walk through a door, such as a raised step or threshold. Trim any bushes or trees on the path to your home. Use bright outdoor lighting. Clear any walking paths of anything that might make someone trip, such as rocks or tools. Regularly check to see if handrails are loose or broken. Make sure that both sides of any steps have handrails. Any raised decks and porches should have guardrails on the edges. Have any leaves, snow, or ice cleared regularly. Use sand or salt on walking paths during winter. Clean up any spills in your garage right away. This includes oil or grease spills. What can I do in the bathroom? Use night lights. Install grab bars by the toilet and in the tub and shower. Do not use towel bars as grab bars. Use non-skid mats or decals in the tub or shower. If you need to sit down in the shower, use a plastic, non-slip stool. Keep the floor dry. Clean up any water that spills on the floor as soon as it happens. Remove soap buildup in the tub or shower regularly. Attach bath mats securely with double-sided non-slip rug tape. Do not have throw  rugs and other things on the floor that can make you trip. What can I do in the bedroom? Use night lights. Make sure that you have a light by your bed that is easy to reach. Do not use any sheets or blankets that are too big for your bed. They should not hang down onto the floor. Have a firm chair that has side arms. You can use this for support while you get dressed. Do not have throw rugs and other things on the floor that can make you trip. What can I do in the kitchen? Clean up any spills right away. Avoid walking on wet floors. Keep items that you use a lot in easy-to-reach places. If you need to reach something above you, use a strong step stool that has a grab bar. Keep electrical cords out of the way. Do not use floor polish or wax that makes floors slippery. If you must use wax, use non-skid floor wax. Do not have throw rugs and other things on the floor that can make you trip. What can I do with my stairs? Do not leave any items on the stairs. Make sure that there are  handrails on both sides of the stairs and use them. Fix handrails that are broken or loose. Make sure that handrails are as long as the stairways. Check any carpeting to make sure that it is firmly attached to the stairs. Fix any carpet that is loose or worn. Avoid having throw rugs at the top or bottom of the stairs. If you do have throw rugs, attach them to the floor with carpet tape. Make sure that you have a light switch at the top of the stairs and the bottom of the stairs. If you do not have them, ask someone to add them for you. What else can I do to help prevent falls? Wear shoes that: Do not have high heels. Have rubber bottoms. Are comfortable and fit you well. Are closed at the toe. Do not wear sandals. If you use a stepladder: Make sure that it is fully opened. Do not climb a closed stepladder. Make sure that both sides of the stepladder are locked into place. Ask someone to hold it for you, if  possible. Clearly mark and make sure that you can see: Any grab bars or handrails. First and last steps. Where the edge of each step is. Use tools that help you move around (mobility aids) if they are needed. These include: Canes. Walkers. Scooters. Crutches. Turn on the lights when you go into a dark area. Replace any light bulbs as soon as they burn out. Set up your furniture so you have a clear path. Avoid moving your furniture around. If any of your floors are uneven, fix them. If there are any pets around you, be aware of where they are. Review your medicines with your doctor. Some medicines can make you feel dizzy. This can increase your chance of falling. Ask your doctor what other things that you can do to help prevent falls. This information is not intended to replace advice given to you by your health care provider. Make sure you discuss any questions you have with your health care provider. Document Released: 09/04/2009 Document Revised: 04/15/2016 Document Reviewed: 12/13/2014 Elsevier Interactive Patient Education  2017 [employer].

## 2023-04-12 NOTE — Progress Notes (Signed)
I connected with  Dylan Benton on 04/12/23 by a audio enabled telemedicine application and verified that I am speaking with the correct person using two identifiers.  Patient Location: Home  Provider Location: Office/Clinic  I discussed the limitations of evaluation and management by telemedicine. The patient expressed understanding and agreed to proceed.  Subjective:   Dylan Benton is a 82 y.o. male who presents for Medicare Annual/Subsequent preventive examination.  Review of Systems     Cardiac Risk Factors include: advanced age (>86men, >62 women);family history of premature cardiovascular disease;male gender;obesity (BMI >30kg/m2)     Objective:    Today's Vitals   04/12/23 1403  Weight: 250 lb (113.4 kg)  Height: 6\' 3"  (1.905 m)  PainSc: 0-No pain   Body mass index is 31.25 kg/m.     04/12/2023    2:08 PM 04/07/2022   10:35 AM 05/22/2020    9:20 AM 09/26/2018    3:54 AM 02/06/2018    6:04 AM 03/12/2017    9:58 PM 09/17/2014    2:24 AM  Advanced Directives  Does Patient Have a Medical Advance Directive? No No Yes No No No No  Does patient want to make changes to medical advance directive?   No - Patient declined      Would patient like information on creating a medical advance directive? No - Patient declined No - Patient declined  No - Patient declined No - Patient declined No - Patient declined No - patient declined information    Current Medications (verified) Outpatient Encounter Medications as of 04/12/2023  Medication Sig   albuterol (VENTOLIN HFA) 108 (90 Base) MCG/ACT inhaler TAKE 2 PUFFS BY MOUTH EVERY 6 HOURS AS NEEDED FOR WHEEZE OR SHORTNESS OF BREATH   aspirin 81 MG EC tablet TAKE 1 TABLET BY MOUTH EVERY DAY   carvedilol (COREG) 6.25 MG tablet TAKE 1 TABLET BY MOUTH 2 TIMES DAILY WITH A MEAL.   dorzolamide-timolol (COSOPT) 22.3-6.8 MG/ML ophthalmic solution Place 1 drop into both eyes 2 (two) times daily.   ENTRESTO 97-103 MG TAKE 1 TABLET BY MOUTH  TWICE A DAY   FARXIGA 10 MG TABS tablet TAKE 1 TABLET (10 MG TOTAL) BY MOUTH DAILY BEFORE BREAKFAST   latanoprost (XALATAN) 0.005 % ophthalmic solution Place 1 drop into both eyes at bedtime.    rosuvastatin (CRESTOR) 10 MG tablet TAKE 1 TABLET BY MOUTH EVERY DAY   spironolactone (ALDACTONE) 25 MG tablet TAKE 1 TABLET BY MOUTH EVERY DAY   No facility-administered encounter medications on file as of 04/12/2023.    Allergies (verified) Ciprofloxacin, Prednisone, and Sulfa antibiotics   History: Past Medical History:  Diagnosis Date   Allergy    Anemia, unspecified    NOS ?resolved   Anxiety    Aortic root dilation (HCC)    Arthritis    ASTHMA, UNSPECIFIED, UNSPECIFIED STATUS 08/06/2009   Annotation: No exacerbations.  Qualifier: Diagnosis of  By: Scot Dock MD, Madhav     Bladder calculus 05/05/2012   Cataract    Chest pain    ECHOCARDIOGRAM, ABNORMAL 12/26/2006    Aortic root dilation  This problem surfaced in 2006 after a cardiology referral for chest pain.  He was seen by Dr. Gala Romney in January o6 and cathed on Jan. 18.  Coronaries were nl and EF was 65%.  Echo showed mild aortic root dilation of 41mm. He was started on metoprolol (although I notice that he is no longer on this.  Annual echo to follow aortic root was  advised.  Aortic root dimension was unchanged on studies in 2009 and 2010.  Will continue to follow this, perhaps not every year as it seems stable.    Erectile dysfunction 05/05/2012   GERD (gastroesophageal reflux disease) 05/05/2012   H pylori ulcer    teated H pylori   History of back surgery    Hyperlipidemia 05/08/2012   Hypertension    Impaired glucose tolerance 05/05/2012   Lumbar disc disease 05/08/2012   Male stress incontinence 05/05/2012   Myocardial infarction [facility])    pt states, "i've been told I have had two heart attacks   Obesity    OBESITY 10/06/2006   Qualifier: Diagnosis of  By: Aundria Rud MD, Roseanne Reno     Prostate cancer [facility])    Hx of    PROSTATE CANCER,  HX OF 08/06/2009   Annotation: prostatectomy and radiotherapy in 1990s,  Dr. Annabell Howells Qualifier: Diagnosis of  By: Scot Dock MD, Madhav     PUD (peptic ulcer disease) 05/05/2012   Systolic CHF (HCC) 05/13/2016   Thoracic aortic aneurysm (HCC)    UNSPECIFIED OPEN-ANGLE GLAUCOMA 08/06/2009   Annotation: Managed by opthalmologist Dr. Lottie Dawson Qualifier: Diagnosis of  By: Scot Dock MD, Madhav     Past Surgical History:  Procedure Laterality Date   BACK SURGERY     ear surgury     eye surgury     LEFT HEART CATH AND CORONARY ANGIOGRAPHY N/A 02/06/2018   Procedure: LEFT HEART CATH AND CORONARY ANGIOGRAPHY;  Surgeon: Laurey Morale, MD;  Location: [facility] INVASIVE CV LAB;  Service: Cardiovascular;  Laterality: N/A;   PROSTATECTOMY     Family History  Problem Relation Age of Onset   Coronary artery disease Other    Hypertension Other    Stroke Other    Colon cancer Neg Hx    Esophageal cancer Neg Hx    Rectal cancer Neg Hx    Stomach cancer Neg Hx    Social History   Socioeconomic History   Marital status: Married    Spouse name: Not on file   Number of children: 2   Years of education: 14   Highest education level: Not on file  Occupational History   Occupation: Lobbyist (Part-time)  Tobacco Use   Smoking status: Former    Types: Cigarettes    Quit date: 12/02/1988    Years since quitting: 34.3   Smokeless tobacco: Never  Vaping Use   Vaping Use: Never used  Substance and Sexual Activity   Alcohol use: No   Drug use: No   Sexual activity: Not on file  Other Topics Concern   Not on file  Social History Narrative   Works for [employer] at [employer] in [place].   Social Determinants of Health   Financial Resource Strain: Low Risk  (04/12/2023)   Overall Financial Resource Strain (CARDIA)    Difficulty of Paying Living Expenses: Not hard at all  Food Insecurity: No Food Insecurity (04/12/2023)   Hunger Vital Sign    Worried About Running Out of Food in the Last Year: Never true    Ran  Out of Food in the Last Year: Never true  Transportation Needs: No Transportation Needs (04/12/2023)   PRAPARE - Administrator, Civil Service (Medical): No    Lack of Transportation (Non-Medical): No  Physical Activity: Sufficiently Active (04/12/2023)   Exercise Vital Sign    Days of Exercise per Week: 5 days    Minutes of Exercise per Session: 30 min  Stress: No Stress Concern Present (04/12/2023)   [employer] of Occupational Health - Occupational Stress Questionnaire    Feeling of Stress : Not at all  Social Connections: Socially Integrated (04/12/2023)   Social Connection and Isolation Panel [NHANES]    Frequency of Communication with Friends and Family: More than three times a week    Frequency of Social Gatherings with Friends and Family: More than three times a week    Attends Religious Services: More than 4 times per year    Active Member of [employer] or Organizations: Yes    Attends Engineer, structural: More than 4 times per year    Marital Status: Married    Tobacco Counseling Counseling given: Not Answered   Clinical Intake:  Pre-visit preparation completed: Yes  Pain : No/denies pain Pain Score: 0-No pain     BMI - recorded: 31.25 Nutritional Status: BMI > 30  Obese Nutritional Risks: None Diabetes: No  How often do you need to have someone help you when you read instructions, pamphlets, or other written materials from your doctor or pharmacy?: 1 - Never What is the last grade level you completed in school?: [employer] Attendant  Diabetic? No  Interpreter Needed?: No  Information entered by :: Jovana Rembold N. Deeanna Beightol, LPN.   Activities of Daily Living    04/12/2023    2:09 PM  In your present state of health, do you have any difficulty performing the following activities:  Hearing? 0  Vision? 0  Difficulty concentrating or making decisions? 0  Walking or climbing stairs? 0  Dressing or bathing? 0  Doing errands, shopping? 0   Preparing Food and eating ? N  Using the Toilet? N  In the past six months, have you accidently leaked urine? Y  Comment hx of prostate surgery  Do you have problems with loss of bowel control? N  Managing your Medications? N  Managing your Finances? N  Housekeeping or managing your Housekeeping? N    Patient Care Team: Corwin Levins, MD as PCP - General (Internal Medicine) Runell Gess, MD as PCP - Cardiology (Cardiology) Laurey Morale, MD as PCP - Advanced Heart Failure (Cardiology)  Indicate any recent Medical Services you may have received from other than [facility] providers in the past year (date may be approximate).     Assessment:   This is a routine wellness examination for [place].  Hearing/Vision screen Hearing Screening - Comments:: Denies hearing difficulties.  Right ear: low hearing/ringing. Vision Screening - Comments:: Loss sight in left eye.   Wears rx glasses - up to date with routine eye exams with Reginia Naas, MD.   Dietary issues and exercise activities discussed: Current Exercise Habits: Home exercise routine, Type of exercise: walking;Other - see comments (stationary bike, step machine), Time (Minutes): 30, Frequency (Times/Week): 5, Weekly Exercise (Minutes/Week): 150, Intensity: Moderate, Exercise limited by: respiratory conditions(s);cardiac condition(s)   Goals Addressed             This Visit's Progress    My goal for 2024 is to evaluate my left side hip pain.        Depression Screen    04/12/2023    2:09 PM 03/09/2023    3:54 PM 03/07/2023    1:49 PM 09/01/2022    1:04 PM 04/07/2022   10:41 AM 02/24/2022    2:12 PM 02/24/2022    2:05 PM  PHQ 2/9 Scores  PHQ - 2 Score 0 0 0 0 0 0 0  PHQ- 9 Score 0 0 5 6       Fall Risk    04/12/2023    2:08 PM 03/09/2023    3:54 PM 03/07/2023    1:50 PM 09/01/2022    1:05 PM 04/07/2022   10:36 AM  Fall Risk   Falls in the past year? 0 0 0 1 1  Number falls in past yr: 0 0 0 1 0  Injury with Fall?  0 0 0 0 0  Risk for fall due to : No Fall Risks No Fall Risks No Fall Risks  History of fall(s)  Follow up Falls prevention discussed Falls evaluation completed Falls evaluation completed  Falls evaluation completed    FALL RISK PREVENTION PERTAINING TO THE HOME:  Any stairs in or around the home? Yes  If so, are there any without handrails? No  Home free of loose throw rugs in walkways, pet beds, electrical cords, etc? Yes  Adequate lighting in your home to reduce risk of falls? Yes   ASSISTIVE DEVICES UTILIZED TO PREVENT FALLS:  Life alert? No  Use of a cane, walker or w/c? Yes  Grab bars in the bathroom? Yes  Shower chair or bench in shower? Yes  Elevated toilet seat or a handicapped toilet? No   TIMED UP AND GO:  Was the test performed? No . Telephone Visit  Cognitive Function:        04/12/2023    2:12 PM 04/07/2022   10:52 AM  6CIT Screen  What Year? 0 points 0 points  What month? 0 points 0 points  What time? 0 points 0 points  Count back from 20 0 points 0 points  Months in reverse 0 points 0 points  Repeat phrase 0 points 0 points  Total Score 0 points 0 points    Immunizations Immunization History  Administered Date(s) Administered   COVID-19, mRNA, vaccine(Comirnaty)12 years and older 09/18/2022   PFIZER(Purple Top)SARS-COV-2 Vaccination 01/24/2020, 02/20/2020, 09/20/2020   Pneumococcal Conjugate-13 11/07/2014   Pneumococcal Polysaccharide-23 11/09/2017   Tdap 04/22/1976    TDAP status: Up to date expire 09/22/2023  Flu Vaccine status: Declined, Education has been provided regarding the importance of this vaccine but patient still declined. Advised may receive this vaccine at local pharmacy or Health Dept. Aware to provide a copy of the vaccination record if obtained from local pharmacy or Health Dept. Verbalized acceptance and understanding.  Pneumococcal vaccine status: Up to date  Covid-19 vaccine status: Completed vaccines  Qualifies for  Shingles Vaccine? Yes   Zostavax completed No   Shingrix Completed?: No.    Education has been provided regarding the importance of this vaccine. Patient has been advised to call insurance company to determine out of pocket expense if they have not yet received this vaccine. Advised may also receive vaccine at local pharmacy or Health Dept. Verbalized acceptance and understanding.  Screening Tests Health Maintenance  Topic Date Due   Zoster Vaccines- Shingrix (1 of 2) Never done   DTaP/Tdap/Td (2 - Td or Tdap) 04/22/1986   COVID-19 Vaccine (5 - 2023-24 season) 11/13/2022   Medicare Annual Wellness (AWV)  04/11/2024   COLONOSCOPY (Pts 45-54yrs Insurance coverage will need to be confirmed)  09/16/2026   Pneumonia Vaccine 25+ Years old  Completed   HPV VACCINES  Aged Out   INFLUENZA VACCINE  Discontinued    Health Maintenance  Health Maintenance Due  Topic Date Due   Zoster Vaccines- Shingrix (1 of 2) Never done   DTaP/Tdap/Td (2 -  Td or Tdap) 04/22/1986   COVID-19 Vaccine (5 - 2023-24 season) 11/13/2022    Colorectal cancer screening: No longer required.   Lung Cancer Screening: (Low Dose CT Chest recommended if Age 78-80 years, 30 pack-year currently smoking OR have quit w/in 15years.) does not qualify.   Lung Cancer Screening Referral: no  Additional Screening:  Hepatitis C Screening: does not qualify; Completed: no  Vision Screening: Recommended annual ophthalmology exams for early detection of glaucoma and other disorders of the eye. Is the patient up to date with their annual eye exam?  Yes  Who is the provider or what is the name of the office in which the patient attends annual eye exams? Reginia Naas, MD. If pt is not established with a provider, would they like to be referred to a provider to establish care? No .   Dental Screening: Recommended annual dental exams for proper oral hygiene  Community Resource Referral / Chronic Care Management: CRR required this  visit?  No   CCM required this visit?  No      Plan:     I have personally reviewed and noted the following in the patient's chart:   Medical and social history Use of alcohol, tobacco or illicit drugs  Current medications and supplements including opioid prescriptions. Patient is not currently taking opioid prescriptions. Functional ability and status Nutritional status Physical activity Advanced directives List of other physicians Hospitalizations, surgeries, and ER visits in previous 12 months Vitals Screenings to include cognitive, depression, and falls Referrals and appointments  In addition, I have reviewed and discussed with patient certain preventive protocols, quality metrics, and best practice recommendations. A written personalized care plan for preventive services as well as general preventive health recommendations were provided to patient.     Mickeal Needy, LPN   1/61/0960   Nurse Notes:  Normal cognitive status assessed by direct observation via telephone conversation by this Nurse Health Advisor. No abnormalities found.

## 2023-04-27 DIAGNOSIS — C61 Malignant neoplasm of prostate: Secondary | ICD-10-CM | POA: Diagnosis not present

## 2023-05-04 DIAGNOSIS — N393 Stress incontinence (female) (male): Secondary | ICD-10-CM | POA: Diagnosis not present

## 2023-05-04 DIAGNOSIS — C61 Malignant neoplasm of prostate: Secondary | ICD-10-CM | POA: Diagnosis not present

## 2023-05-23 ENCOUNTER — Other Ambulatory Visit: Payer: Self-pay | Admitting: Internal Medicine

## 2023-05-23 ENCOUNTER — Other Ambulatory Visit (HOSPITAL_COMMUNITY): Payer: Self-pay | Admitting: Cardiology

## 2023-05-25 ENCOUNTER — Other Ambulatory Visit (HOSPITAL_COMMUNITY): Payer: Self-pay

## 2023-06-17 ENCOUNTER — Ambulatory Visit (INDEPENDENT_AMBULATORY_CARE_PROVIDER_SITE_OTHER): Payer: Medicare Other | Admitting: Internal Medicine

## 2023-06-17 ENCOUNTER — Encounter: Payer: Self-pay | Admitting: Internal Medicine

## 2023-06-17 VITALS — BP 134/80 | HR 55 | Temp 98.5°F | Ht 75.0 in | Wt 248.0 lb

## 2023-06-17 DIAGNOSIS — E559 Vitamin D deficiency, unspecified: Secondary | ICD-10-CM

## 2023-06-17 DIAGNOSIS — R7302 Impaired glucose tolerance (oral): Secondary | ICD-10-CM | POA: Diagnosis not present

## 2023-06-17 DIAGNOSIS — H6121 Impacted cerumen, right ear: Secondary | ICD-10-CM

## 2023-06-17 DIAGNOSIS — I10 Essential (primary) hypertension: Secondary | ICD-10-CM | POA: Diagnosis not present

## 2023-06-17 NOTE — Progress Notes (Unsigned)
Patient ID: Dylan Benton, male   DOB: [birth year], 82 y.o.   MRN: [id number]        Chief Complaint: follow up right ear fullness and impaired hearing, low vit d, htn, hypergycemia       HPI:  Dylan Benton is a 82 y.o. male here with above, different from the external otitis recently.  Pt denies chest pain, increased sob or doe, wheezing, orthopnea, PND, increased LE swelling, palpitations, dizziness or syncope.   Pt denies polydipsia, polyuria, or new focal neuro s/s.    Pt denies fever, wt loss, night sweats, loss of appetite, or other constitutional symptoms         Wt Readings from Last 3 Encounters:  06/17/23 248 lb (112.5 kg)  04/12/23 250 lb (113.4 kg)  03/23/23 244 lb 6.4 oz (110.9 kg)   BP Readings from Last 3 Encounters:  06/17/23 134/80  03/23/23 102/60  03/09/23 138/68         Past Medical History:  Diagnosis Date   Allergy    Anemia, unspecified    NOS ?resolved   Anxiety    Aortic root dilation (HCC)    Arthritis    ASTHMA, UNSPECIFIED, UNSPECIFIED STATUS 08/06/2009   Annotation: No exacerbations.  Qualifier: Diagnosis of  By: Scot Dock MD, Madhav     Bladder calculus 05/05/2012   Cataract    Chest pain    ECHOCARDIOGRAM, ABNORMAL 12/26/2006    Aortic root dilation  This problem surfaced in 2006 after a cardiology referral for chest pain.  He was seen by Dr. Gala Romney in January o6 and cathed on Jan. 18.  Coronaries were nl and EF was 65%.  Echo showed mild aortic root dilation of 41mm. He was started on metoprolol (although I notice that he is no longer on this.  Annual echo to follow aortic root was advised.  Aortic root dimension was unchanged on studies in 2009 and 2010.  Will continue to follow this, perhaps not every year as it seems stable.    Erectile dysfunction 05/05/2012   GERD (gastroesophageal reflux disease) 05/05/2012   H pylori ulcer    teated H pylori   History of back surgery    Hyperlipidemia 05/08/2012   Hypertension    Impaired glucose tolerance  05/05/2012   Lumbar disc disease 05/08/2012   Male stress incontinence 05/05/2012   Myocardial infarction [facility])    pt states, "i've been told I have had two heart attacks   Obesity    OBESITY 10/06/2006   Qualifier: Diagnosis of  By: Aundria Rud MD, Roseanne Reno     Prostate cancer [facility])    Hx of    PROSTATE CANCER, HX OF 08/06/2009   Annotation: prostatectomy and radiotherapy in 1990s,  Dr. Annabell Howells Qualifier: Diagnosis of  By: Scot Dock MD, Madhav     PUD (peptic ulcer disease) 05/05/2012   Systolic CHF (HCC) 05/13/2016   Thoracic aortic aneurysm (HCC)    UNSPECIFIED OPEN-ANGLE GLAUCOMA 08/06/2009   Annotation: Managed by opthalmologist Dr. Lottie Dawson Qualifier: Diagnosis of  By: Scot Dock MD, Madhav     Past Surgical History:  Procedure Laterality Date   BACK SURGERY     ear surgury     eye surgury     LEFT HEART CATH AND CORONARY ANGIOGRAPHY N/A 02/06/2018   Procedure: LEFT HEART CATH AND CORONARY ANGIOGRAPHY;  Surgeon: Laurey Morale, MD;  Location: [facility] INVASIVE CV LAB;  Service: Cardiovascular;  Laterality: N/A;   PROSTATECTOMY      reports that  he quit smoking about 34 years ago. His smoking use included cigarettes. He has never used smokeless tobacco. He reports that he does not drink alcohol and does not use drugs. family history includes Coronary artery disease in an other family member; Hypertension in an other family member; Stroke in an other family member. Allergies  Allergen Reactions   Ciprofloxacin Other (See Comments)    Bilateral eye swelling   Prednisone Other (See Comments)    Effects heart rate   Sulfa Antibiotics Itching    Arms itching   Current Outpatient Medications on File Prior to Visit  Medication Sig Dispense Refill   albuterol (VENTOLIN HFA) 108 (90 Base) MCG/ACT inhaler TAKE 2 PUFFS BY MOUTH EVERY 6 HOURS AS NEEDED FOR WHEEZE OR SHORTNESS OF BREATH 8.5 each 11   aspirin 81 MG EC tablet TAKE 1 TABLET BY MOUTH EVERY DAY 90 tablet 3   carvedilol (COREG) 6.25 MG tablet TAKE 1  TABLET BY MOUTH TWICE A DAY WITH FOOD 180 tablet 3   dorzolamide-timolol (COSOPT) 22.3-6.8 MG/ML ophthalmic solution Place 1 drop into both eyes 2 (two) times daily.     ENTRESTO 97-103 MG TAKE 1 TABLET BY MOUTH TWICE A DAY 180 tablet 3   FARXIGA 10 MG TABS tablet TAKE 1 TABLET (10 MG TOTAL) BY MOUTH DAILY BEFORE BREAKFAST 90 tablet 3   latanoprost (XALATAN) 0.005 % ophthalmic solution Place 1 drop into both eyes at bedtime.      meloxicam (MOBIC) 7.5 MG tablet Take 7.5 mg by mouth daily.     rosuvastatin (CRESTOR) 10 MG tablet TAKE 1 TABLET BY MOUTH EVERY DAY 90 tablet 1   spironolactone (ALDACTONE) 25 MG tablet TAKE 1 TABLET BY MOUTH EVERY DAY 90 tablet 3   No current facility-administered medications on file prior to visit.        ROS:  All others reviewed and negative.  Objective        PE:  BP 134/80 (BP Location: Right Arm, Patient Position: Sitting, Cuff Size: Normal)   Pulse (!) 55   Temp 98.5 F (36.9 C) (Oral)   Ht 6\' 3"  (1.905 m)   Wt 248 lb (112.5 kg)   SpO2 100%   BMI 31.00 kg/m                 Constitutional: Pt appears in NAD               HENT: Head: NCAT.                Right Ear: External ear normal.                 Left Ear: External ear normal. Right canal wax impactions resolved with irrigation, hearing improved               Eyes: . Pupils are equal, round, and reactive to light. Conjunctivae and EOM are normal               Nose: without d/c or deformity               Neck: Neck supple. Gross normal ROM               Cardiovascular: Normal rate and regular rhythm.                 Pulmonary/Chest: Effort normal and breath sounds without rales or wheezing.  Abd:  Soft, NT, ND, + BS, no organomegaly               Neurological: Pt is alert. At baseline orientation, motor grossly intact               Skin: Skin is warm. No rashes, no other new lesions, LE edema - none               Psychiatric: Pt behavior is normal without agitation   Micro:  none  Cardiac tracings I have personally interpreted today:  none  Pertinent Radiological findings (summarize): none   Lab Results  Component Value Date   WBC 3.7 (L) 03/07/2023   HGB 14.7 03/07/2023   HCT 43.9 03/07/2023   PLT 169.0 03/07/2023   GLUCOSE 94 03/07/2023   CHOL 118 03/07/2023   TRIG 68.0 03/07/2023   HDL 51.60 03/07/2023   LDLCALC 53 03/07/2023   ALT 14 03/07/2023   AST 19 03/07/2023   NA 139 03/07/2023   K 4.9 03/07/2023   CL 102 03/07/2023   CREATININE 1.21 03/07/2023   BUN 13 03/07/2023   CO2 30 03/07/2023   TSH 1.38 03/07/2023   PSA 1.65 11/09/2017   INR 1.09 01/30/2018   HGBA1C 5.2 03/07/2023   Assessment/Plan:  Dylan Benton is a 82 y.o. Black or African American [2] male with  has a past medical history of Allergy, Anemia, unspecified, Anxiety, Aortic root dilation (HCC), Arthritis, ASTHMA, UNSPECIFIED, UNSPECIFIED STATUS (08/06/2009), Bladder calculus (05/05/2012), Cataract, Chest pain, ECHOCARDIOGRAM, ABNORMAL (12/26/2006), Erectile dysfunction (05/05/2012), GERD (gastroesophageal reflux disease) (05/05/2012), H pylori ulcer, History of back surgery, Hyperlipidemia (05/08/2012), Hypertension, Impaired glucose tolerance (05/05/2012), Lumbar disc disease (05/08/2012), Male stress incontinence (05/05/2012), Myocardial infarction (HCC), Obesity, OBESITY (10/06/2006), Prostate cancer (HCC), PROSTATE CANCER, HX OF (08/06/2009), PUD (peptic ulcer disease) (05/05/2012), Systolic CHF (HCC) (05/13/2016), Thoracic aortic aneurysm (HCC), and UNSPECIFIED OPEN-ANGLE GLAUCOMA (08/06/2009).  Cerumen impaction Ceruminosis is noted.  Wax is removed by syringing and manual debridement. Instructions for home care to prevent wax buildup are given.   Vitamin D deficiency Last vitamin D Lab Results  Component Value Date   VD25OH 45.01 09/01/2022   Stable, cont oral replacement   Essential hypertension BP Readings from Last 3 Encounters:  06/17/23 134/80  03/23/23 102/60   03/09/23 138/68   Stable, pt to continue medical treatment coreg 6.25 bid, entersto 97-103 every day, aldactone 25 qd   Impaired glucose tolerance Lab Results  Component Value Date   HGBA1C 5.2 03/07/2023   Stable, pt to continue current medical treatment farxiga 10 qd  Followup: Return if symptoms worsen or fail to improve.  Oliver Barre, MD 06/19/2023 3:10 PM [facility] Internal Medicine

## 2023-06-17 NOTE — Patient Instructions (Signed)
Your right ear was irrigated of wax today  Please continue all other medications as before, and refills have been done if requested.  Please have the pharmacy call with any other refills you may need.  Please keep your appointments with your specialists as you may have planned

## 2023-06-19 ENCOUNTER — Encounter: Payer: Self-pay | Admitting: Internal Medicine

## 2023-06-19 DIAGNOSIS — H6120 Impacted cerumen, unspecified ear: Secondary | ICD-10-CM | POA: Insufficient documentation

## 2023-06-19 NOTE — Assessment & Plan Note (Signed)
Last vitamin D Lab Results  Component Value Date   VD25OH 45.01 09/01/2022   Stable, cont oral replacement

## 2023-06-19 NOTE — Assessment & Plan Note (Signed)
Lab Results  Component Value Date   HGBA1C 5.2 03/07/2023   Stable, pt to continue current medical treatment farxiga 10 qd

## 2023-06-19 NOTE — Assessment & Plan Note (Signed)
BP Readings from Last 3 Encounters:  06/17/23 134/80  03/23/23 102/60  03/09/23 138/68   Stable, pt to continue medical treatment coreg 6.25 bid, entersto 97-103 every day, aldactone 25 qd

## 2023-06-19 NOTE — Assessment & Plan Note (Signed)
Ceruminosis is noted.  Wax is removed by syringing and manual debridement. Instructions for home care to prevent wax buildup are given.  

## 2023-07-09 ENCOUNTER — Other Ambulatory Visit: Payer: Self-pay | Admitting: Internal Medicine

## 2023-07-11 ENCOUNTER — Other Ambulatory Visit: Payer: Self-pay

## 2023-09-02 ENCOUNTER — Other Ambulatory Visit: Payer: Self-pay | Admitting: Internal Medicine

## 2023-09-02 ENCOUNTER — Other Ambulatory Visit: Payer: Self-pay

## 2023-09-06 ENCOUNTER — Encounter: Payer: Self-pay | Admitting: Internal Medicine

## 2023-09-06 ENCOUNTER — Ambulatory Visit (INDEPENDENT_AMBULATORY_CARE_PROVIDER_SITE_OTHER): Payer: Medicare Other | Admitting: Internal Medicine

## 2023-09-06 VITALS — BP 138/82 | HR 53 | Temp 98.0°F | Ht 75.0 in | Wt 248.0 lb

## 2023-09-06 DIAGNOSIS — R55 Syncope and collapse: Secondary | ICD-10-CM | POA: Diagnosis not present

## 2023-09-06 DIAGNOSIS — I10 Essential (primary) hypertension: Secondary | ICD-10-CM | POA: Diagnosis not present

## 2023-09-06 DIAGNOSIS — E559 Vitamin D deficiency, unspecified: Secondary | ICD-10-CM | POA: Diagnosis not present

## 2023-09-06 DIAGNOSIS — L609 Nail disorder, unspecified: Secondary | ICD-10-CM

## 2023-09-06 DIAGNOSIS — R7302 Impaired glucose tolerance (oral): Secondary | ICD-10-CM | POA: Diagnosis not present

## 2023-09-06 DIAGNOSIS — E785 Hyperlipidemia, unspecified: Secondary | ICD-10-CM | POA: Diagnosis not present

## 2023-09-06 DIAGNOSIS — I5022 Chronic systolic (congestive) heart failure: Secondary | ICD-10-CM

## 2023-09-06 DIAGNOSIS — J439 Emphysema, unspecified: Secondary | ICD-10-CM

## 2023-09-06 DIAGNOSIS — M79675 Pain in left toe(s): Secondary | ICD-10-CM | POA: Diagnosis not present

## 2023-09-06 LAB — URINALYSIS, ROUTINE W REFLEX MICROSCOPIC
Bilirubin Urine: NEGATIVE
Hgb urine dipstick: NEGATIVE
Ketones, ur: NEGATIVE
Leukocytes,Ua: NEGATIVE
Nitrite: NEGATIVE
Specific Gravity, Urine: 1.02 (ref 1.000–1.030)
Total Protein, Urine: NEGATIVE
Urine Glucose: >=1000 — AB
Urobilinogen, UA: 1 (ref 0.0–1.0)
pH: 6 (ref 5.0–8.0)

## 2023-09-06 LAB — CBC WITH DIFFERENTIAL/PLATELET
Basophils Absolute: 0 10*3/uL (ref 0.0–0.1)
Basophils Relative: 1.1 % (ref 0.0–3.0)
Eosinophils Absolute: 0.1 10*3/uL (ref 0.0–0.7)
Eosinophils Relative: 2.6 % (ref 0.0–5.0)
HCT: 45.1 % (ref 39.0–52.0)
Hemoglobin: 14.6 g/dL (ref 13.0–17.0)
Lymphocytes Relative: 35.8 % (ref 12.0–46.0)
Lymphs Abs: 1.4 10*3/uL (ref 0.7–4.0)
MCHC: 32.3 g/dL (ref 30.0–36.0)
MCV: 99.7 fL (ref 78.0–100.0)
Monocytes Absolute: 0.5 10*3/uL (ref 0.1–1.0)
Monocytes Relative: 13.3 % — ABNORMAL HIGH (ref 3.0–12.0)
Neutro Abs: 1.8 10*3/uL (ref 1.4–7.7)
Neutrophils Relative %: 47.2 % (ref 43.0–77.0)
Platelets: 147 10*3/uL — ABNORMAL LOW (ref 150.0–400.0)
RBC: 4.53 Mil/uL (ref 4.22–5.81)
RDW: 12.2 % (ref 11.5–15.5)
WBC: 3.9 10*3/uL — ABNORMAL LOW (ref 4.0–10.5)

## 2023-09-06 LAB — LIPID PANEL
Cholesterol: 126 mg/dL (ref 0–200)
HDL: 49.5 mg/dL (ref >39.00)
LDL Cholesterol: 64 mg/dL (ref 0–99)
NonHDL: 76.57
Total CHOL/HDL Ratio: 3
Triglycerides: 65 mg/dL (ref 0.0–149.0)
VLDL: 13 mg/dL (ref 0.0–40.0)

## 2023-09-06 LAB — BASIC METABOLIC PANEL
BUN: 11 mg/dL (ref 6–23)
CO2: 29 meq/L (ref 19–32)
Calcium: 9.7 mg/dL (ref 8.4–10.5)
Chloride: 102 meq/L (ref 96–112)
Creatinine, Ser: 1.12 mg/dL (ref 0.40–1.50)
GFR: 60.31 mL/min (ref >60.00)
Glucose, Bld: 93 mg/dL (ref 70–99)
Potassium: 4 meq/L (ref 3.5–5.1)
Sodium: 138 meq/L (ref 135–145)

## 2023-09-06 LAB — HEMOGLOBIN A1C: Hgb A1c MFr Bld: 5.3 % (ref 4.6–6.5)

## 2023-09-06 LAB — HEPATIC FUNCTION PANEL
ALT: 11 U/L (ref 0–53)
AST: 16 U/L (ref 0–37)
Albumin: 4.4 g/dL (ref 3.5–5.2)
Alkaline Phosphatase: 58 U/L (ref 39–117)
Bilirubin, Direct: 0.2 mg/dL (ref 0.0–0.3)
Total Bilirubin: 0.8 mg/dL (ref 0.2–1.2)
Total Protein: 7.2 g/dL (ref 6.0–8.3)

## 2023-09-06 LAB — TSH: TSH: 1.68 u[IU]/mL (ref 0.35–5.50)

## 2023-09-06 NOTE — Progress Notes (Unsigned)
Patient ID: KEYMANI BETTEN, male   DOB: 03-20-1941, 82 y.o.   MRN: [id number]        Chief Complaint: follow up recurrent syncope, left great toenail disorder, copd, chf, low vit d, htn, hyperglycemia, hld,        HPI:  Dylan Benton is a 82 y.o. male here with c/o pain to worsening left great toenail as digs in to the toe and gets caught at night on the sheets.  Pt denies chest pain, increased sob or doe, wheezing, orthopnea, PND, increased LE swelling, palpitations, dizziness but  Had episode syncope last pm after stood up at the beside with urinal to urinate, when couldn't start urination while sitting and has fallen before. No injury. Fell back on the bed.Woke up with the cold sweat after seconds only  Pt denies chest pain, increased sob or doe, wheezing, orthopnea, PND, increased LE swelling, palpitations,. .  Has happened several times before, last episode about 53yrs, has been seen per Dr Shirlee Latch cardiology.   Pt denies polydipsia, polyuria, or new focal neuro s/s.    Pt denies recent wt loss, night sweats, loss of appetite, or other constitutional symptoms Overall Vision worsening recently, no longer driving, walks with cane. Wt Readings from Last 3 Encounters:  09/06/23 248 lb (112.5 kg)  06/17/23 248 lb (112.5 kg)  04/12/23 250 lb (113.4 kg)   BP Readings from Last 3 Encounters:  09/06/23 138/82  06/17/23 134/80  03/23/23 102/60         Past Medical History:  Diagnosis Date   Allergy    Anemia, unspecified    NOS ?resolved   Anxiety    Aortic root dilation (HCC)    Arthritis    ASTHMA, UNSPECIFIED, UNSPECIFIED STATUS 08/06/2009   Annotation: No exacerbations.  Qualifier: Diagnosis of  By: Scot Dock MD, Madhav     Bladder calculus 05/05/2012   Cataract    Chest pain    ECHOCARDIOGRAM, ABNORMAL 12/26/2006    Aortic root dilation  This problem surfaced in 2006 after a cardiology referral for chest pain.  He was seen by Dr. Gala Romney in January o6 and cathed on Jan. 18.  Coronaries were  nl and EF was 65%.  Echo showed mild aortic root dilation of 41mm. He was started on metoprolol (although I notice that he is no longer on this.  Annual echo to follow aortic root was advised.  Aortic root dimension was unchanged on studies in 2009 and 2010.  Will continue to follow this, perhaps not every year as it seems stable.    Erectile dysfunction 05/05/2012   GERD (gastroesophageal reflux disease) 05/05/2012   H pylori ulcer    teated H pylori   History of back surgery    Hyperlipidemia 05/08/2012   Hypertension    Impaired glucose tolerance 05/05/2012   Lumbar disc disease 05/08/2012   Male stress incontinence 05/05/2012   Myocardial infarction [facility])    pt states, "i've been told I have had two heart attacks   Obesity    OBESITY 10/06/2006   Qualifier: Diagnosis of  By: Aundria Rud MD, Roseanne Reno     Prostate cancer [facility])    Hx of    PROSTATE CANCER, HX OF 08/06/2009   Annotation: prostatectomy and radiotherapy in 1990s,  Dr. Annabell Howells Qualifier: Diagnosis of  By: Scot Dock MD, Madhav     PUD (peptic ulcer disease) 05/05/2012   Systolic CHF (HCC) 05/13/2016   Thoracic aortic aneurysm (HCC)    UNSPECIFIED OPEN-ANGLE GLAUCOMA 08/06/2009  Annotation: Managed by opthalmologist Dr. Lottie Dawson Qualifier: Diagnosis of  By: Scot Dock MD, Madhav     Past Surgical History:  Procedure Laterality Date   BACK SURGERY     ear surgury     eye surgury     LEFT HEART CATH AND CORONARY ANGIOGRAPHY N/A 02/06/2018   Procedure: LEFT HEART CATH AND CORONARY ANGIOGRAPHY;  Surgeon: Laurey Morale, MD;  Location: [facility] INVASIVE CV LAB;  Service: Cardiovascular;  Laterality: N/A;   PROSTATECTOMY      reports that he quit smoking about 34 years ago. His smoking use included cigarettes. He has never used smokeless tobacco. He reports that he does not drink alcohol and does not use drugs. family history includes Coronary artery disease in an other family member; Hypertension in an other family member; Stroke in an other family  member. Allergies  Allergen Reactions   Ciprofloxacin Other (See Comments)    Bilateral eye swelling   Prednisone Other (See Comments)    Effects heart rate   Sulfa Antibiotics Itching    Arms itching   Current Outpatient Medications on File Prior to Visit  Medication Sig Dispense Refill   albuterol (VENTOLIN HFA) 108 (90 Base) MCG/ACT inhaler TAKE 2 PUFFS BY MOUTH EVERY 6 HOURS AS NEEDED FOR WHEEZE OR SHORTNESS OF BREATH 8.5 each 11   aspirin 81 MG EC tablet TAKE 1 TABLET BY MOUTH EVERY DAY 90 tablet 3   carvedilol (COREG) 6.25 MG tablet TAKE 1 TABLET BY MOUTH TWICE A DAY WITH FOOD 180 tablet 3   dorzolamide-timolol (COSOPT) 22.3-6.8 MG/ML ophthalmic solution Place 1 drop into both eyes 2 (two) times daily.     ENTRESTO 97-103 MG TAKE 1 TABLET BY MOUTH TWICE A DAY 180 tablet 3   FARXIGA 10 MG TABS tablet TAKE 1 TABLET (10 MG TOTAL) BY MOUTH DAILY BEFORE BREAKFAST 90 tablet 3   latanoprost (XALATAN) 0.005 % ophthalmic solution Place 1 drop into both eyes at bedtime.      meloxicam (MOBIC) 7.5 MG tablet TAKE 1 TABLET BY MOUTH EVERY DAY 90 tablet 3   rosuvastatin (CRESTOR) 10 MG tablet TAKE 1 TABLET BY MOUTH EVERY DAY 90 tablet 1   spironolactone (ALDACTONE) 25 MG tablet TAKE 1 TABLET BY MOUTH EVERY DAY 90 tablet 3   No current facility-administered medications on file prior to visit.        ROS:  All others reviewed and negative.  Objective        PE:  BP 138/82 (BP Location: Left Arm, Patient Position: Sitting, Cuff Size: Normal)   Pulse (!) 53   Temp 98 F (36.7 C) (Oral)   Ht 6\' 3"  (1.905 m)   Wt 248 lb (112.5 kg)   SpO2 98%   BMI 31.00 kg/m                 Constitutional: Pt appears in NAD               HENT: Head: NCAT.                Right Ear: External ear normal.                 Left Ear: External ear normal.                Eyes: . Pupils are equal, round, and reactive to light. Conjunctivae and EOM are normal               Nose: without  d/c or deformity                Neck: Neck supple. Gross normal ROM               Cardiovascular: Normal rate and regular rhythm.                 Pulmonary/Chest: Effort normal and breath sounds without rales or wheezing.                Abd:  Soft, NT, ND, + BS, no organomegaly               Neurological: Pt is alert. At baseline orientation, motor grossly intact               Skin: Skin is warm. No rashes, no other new lesions, LE edema - none               Psychiatric: Pt behavior is normal without agitation   Micro: none  Cardiac tracings I have personally interpreted today:  none  Pertinent Radiological findings (summarize): none   Lab Results  Component Value Date   WBC 3.9 (L) 09/06/2023   HGB 14.6 09/06/2023   HCT 45.1 09/06/2023   PLT 147.0 (L) 09/06/2023   GLUCOSE 93 09/06/2023   CHOL 126 09/06/2023   TRIG 65.0 09/06/2023   HDL 49.50 09/06/2023   LDLCALC 64 09/06/2023   ALT 11 09/06/2023   AST 16 09/06/2023   NA 138 09/06/2023   K 4.0 09/06/2023   CL 102 09/06/2023   CREATININE 1.12 09/06/2023   BUN 11 09/06/2023   CO2 29 09/06/2023   TSH 1.68 09/06/2023   PSA 1.65 11/09/2017   INR 1.09 01/30/2018   HGBA1C 5.3 09/06/2023   Assessment/Plan:  JAZHIEL NEUPERT is a 82 y.o. Black or African American [2] male with  has a past medical history of Allergy, Anemia, unspecified, Anxiety, Aortic root dilation (HCC), Arthritis, ASTHMA, UNSPECIFIED, UNSPECIFIED STATUS (08/06/2009), Bladder calculus (05/05/2012), Cataract, Chest pain, ECHOCARDIOGRAM, ABNORMAL (12/26/2006), Erectile dysfunction (05/05/2012), GERD (gastroesophageal reflux disease) (05/05/2012), H pylori ulcer, History of back surgery, Hyperlipidemia (05/08/2012), Hypertension, Impaired glucose tolerance (05/05/2012), Lumbar disc disease (05/08/2012), Male stress incontinence (05/05/2012), Myocardial infarction (HCC), Obesity, OBESITY (10/06/2006), Prostate cancer (HCC), PROSTATE CANCER, HX OF (08/06/2009), PUD (peptic ulcer disease) (05/05/2012), Systolic CHF  (HCC) (05/13/2016), Thoracic aortic aneurysm (HCC), and UNSPECIFIED OPEN-ANGLE GLAUCOMA (08/06/2009).  COPD (chronic obstructive pulmonary disease) (HCC) Stable, cont in haler prn  Systolic CHF (HCC) Stable volume, cont current med tx  Essential hypertension BP Readings from Last 3 Encounters:  09/06/23 138/82  06/17/23 134/80  03/23/23 102/60   Stable, pt to continue medical treatment coreg 6.25 bid, entresto   Great toe pain, left With nail disorder - for podiatry referral  Hyperlipidemia Lab Results  Component Value Date   LDLCALC 64 09/06/2023   Stable, pt to continue current statin crestor 10 qd   Impaired glucose tolerance Lab Results  Component Value Date   HGBA1C 5.3 09/06/2023   Stable, pt to continue current medical treatment farxiga 10 qd   Vitamin D deficiency Last vitamin D Lab Results  Component Value Date   VD25OH 45.01 09/01/2022   Stable, cont oral replacement   Syncope C/w micturitiion or vasovagal recurrent - declines ecg, echo, cxr , plans to f/u with cardiology as planned  Followup: Return in about 6 months (around 03/06/2024).  Oliver Barre, MD 09/08/2023 5:05 AM [facility]  Primary Care -  [facility]

## 2023-09-06 NOTE — Progress Notes (Signed)
The test results show that your current treatment is OK, as the tests are stable.  Please continue the same plan.  There is no other need for change of treatment or further evaluation based on these results, at this time.  thanks 

## 2023-09-06 NOTE — Patient Instructions (Signed)
Please continue all other medications as before, and refills have been done if requested.  Please have the pharmacy call with any other refills you may need.  Please continue your efforts at being more active, low cholesterol diet, and weight control.  You are otherwise up to date with prevention measures today.  Please keep your appointments with your specialists as you may have planned - cardiology next month  You will be contacted regarding the referral for: podiatry  Please go to the LAB at the blood drawing area for the tests to be done  You will be contacted by phone if any changes need to be made immediately.  Otherwise, you will receive a letter about your results with an explanation, but please check with MyChart first.  Please make an Appointment to return in 6 months, or sooner if needed

## 2023-09-08 ENCOUNTER — Encounter: Payer: Self-pay | Admitting: Internal Medicine

## 2023-09-08 DIAGNOSIS — R55 Syncope and collapse: Secondary | ICD-10-CM | POA: Insufficient documentation

## 2023-09-08 DIAGNOSIS — L609 Nail disorder, unspecified: Secondary | ICD-10-CM | POA: Insufficient documentation

## 2023-09-08 NOTE — Assessment & Plan Note (Signed)
Lab Results  Component Value Date   HGBA1C 5.3 09/06/2023   Stable, pt to continue current medical treatment farxiga 10 qd

## 2023-09-08 NOTE — Assessment & Plan Note (Signed)
With nail disorder - for podiatry referral

## 2023-09-08 NOTE — Assessment & Plan Note (Signed)
Stable , cont inhaler prn

## 2023-09-08 NOTE — Assessment & Plan Note (Signed)
Stable volume, cont current med tx

## 2023-09-08 NOTE — Assessment & Plan Note (Signed)
Lab Results  Component Value Date   LDLCALC 64 09/06/2023   Stable, pt to continue current statin crestor 10 qd

## 2023-09-08 NOTE — Assessment & Plan Note (Signed)
C/w micturitiion or vasovagal recurrent - declines ecg, echo, cxr , plans to f/u with cardiology as planned

## 2023-09-08 NOTE — Assessment & Plan Note (Signed)
Last vitamin D Lab Results  Component Value Date   VD25OH 45.01 09/01/2022   Stable, cont oral replacement

## 2023-09-08 NOTE — Assessment & Plan Note (Signed)
BP Readings from Last 3 Encounters:  09/06/23 138/82  06/17/23 134/80  03/23/23 102/60   Stable, pt to continue medical treatment coreg 6.25 bid, entresto

## 2023-09-14 DIAGNOSIS — H401133 Primary open-angle glaucoma, bilateral, severe stage: Secondary | ICD-10-CM | POA: Diagnosis not present

## 2023-10-17 ENCOUNTER — Other Ambulatory Visit: Payer: Self-pay | Admitting: Internal Medicine

## 2023-11-14 ENCOUNTER — Other Ambulatory Visit: Payer: Self-pay | Admitting: Internal Medicine

## 2023-12-13 ENCOUNTER — Other Ambulatory Visit: Payer: Self-pay | Admitting: Internal Medicine

## 2023-12-14 ENCOUNTER — Inpatient Hospital Stay (HOSPITAL_COMMUNITY): Admission: RE | Admit: 2023-12-14 | Payer: Medicare Other | Source: Ambulatory Visit | Admitting: Cardiology

## 2023-12-14 ENCOUNTER — Other Ambulatory Visit: Payer: Self-pay

## 2023-12-26 ENCOUNTER — Other Ambulatory Visit (HOSPITAL_COMMUNITY): Payer: Self-pay | Admitting: Cardiology

## 2024-01-20 ENCOUNTER — Encounter (HOSPITAL_COMMUNITY): Payer: Self-pay | Admitting: Cardiology

## 2024-01-20 ENCOUNTER — Ambulatory Visit (HOSPITAL_COMMUNITY)
Admission: RE | Admit: 2024-01-20 | Discharge: 2024-01-20 | Disposition: A | Discharge status: Home / Self Care | Payer: Medicare Other | Source: Ambulatory Visit | Attending: Cardiology | Admitting: Cardiology

## 2024-01-20 VITALS — BP 102/60 | HR 51 | Wt 253.2 lb

## 2024-01-20 DIAGNOSIS — I5022 Chronic systolic (congestive) heart failure: Secondary | ICD-10-CM | POA: Diagnosis not present

## 2024-01-20 DIAGNOSIS — I428 Other cardiomyopathies: Secondary | ICD-10-CM | POA: Diagnosis not present

## 2024-01-20 DIAGNOSIS — I110 Hypertensive heart disease with heart failure: Secondary | ICD-10-CM | POA: Insufficient documentation

## 2024-01-20 DIAGNOSIS — I739 Peripheral vascular disease, unspecified: Secondary | ICD-10-CM | POA: Diagnosis not present

## 2024-01-20 DIAGNOSIS — Z79899 Other long term (current) drug therapy: Secondary | ICD-10-CM | POA: Insufficient documentation

## 2024-01-20 DIAGNOSIS — Z7902 Long term (current) use of antithrombotics/antiplatelets: Secondary | ICD-10-CM | POA: Insufficient documentation

## 2024-01-20 DIAGNOSIS — Z7982 Long term (current) use of aspirin: Secondary | ICD-10-CM | POA: Insufficient documentation

## 2024-01-20 DIAGNOSIS — J449 Chronic obstructive pulmonary disease, unspecified: Secondary | ICD-10-CM | POA: Diagnosis not present

## 2024-01-20 DIAGNOSIS — E785 Hyperlipidemia, unspecified: Secondary | ICD-10-CM | POA: Diagnosis not present

## 2024-01-20 DIAGNOSIS — I2510 Atherosclerotic heart disease of native coronary artery without angina pectoris: Secondary | ICD-10-CM | POA: Diagnosis not present

## 2024-01-20 DIAGNOSIS — Z7984 Long term (current) use of oral hypoglycemic drugs: Secondary | ICD-10-CM | POA: Diagnosis not present

## 2024-01-20 LAB — BASIC METABOLIC PANEL
Anion gap: 10 (ref 5–15)
BUN: 13 mg/dL (ref 8–23)
CO2: 25 mmol/L (ref 22–32)
Calcium: 9.3 mg/dL (ref 8.9–10.3)
Chloride: 106 mmol/L (ref 98–111)
Creatinine, Ser: 1.1 mg/dL (ref 0.61–1.24)
GFR, Estimated: >60 mL/min (ref >60)
Glucose, Bld: 95 mg/dL (ref 70–99)
Potassium: 4.5 mmol/L (ref 3.5–5.1)
Sodium: 141 mmol/L (ref 135–145)

## 2024-01-20 LAB — BRAIN NATRIURETIC PEPTIDE: B Natriuretic Peptide: 34.5 pg/mL (ref 0.0–100.0)

## 2024-01-20 MED ORDER — TIOTROPIUM BROMIDE MONOHYDRATE 18 MCG IN CAPS: 18.0000 ug | ORAL_CAPSULE | Freq: Every day | RESPIRATORY_TRACT | 12 refills | Status: AC

## 2024-01-20 NOTE — Progress Notes (Signed)
 ReDS Vest / Clip - 01/20/24 1200       ReDS Vest / Clip   Station Marker D    Ruler Value 35    ReDS Value Range Low volume    ReDS Actual Value 23

## 2024-01-20 NOTE — Patient Instructions (Addendum)
 START Spiriva inhaler 1 puff daily  Labs done today, your results will be available in MyChart, we will contact you for abnormal readings.  Your physician has requested that you have an echocardiogram. Echocardiography is a painless test that uses sound waves to create images of your heart. It provides your doctor with information about the size and shape of your heart and how well your heart's chambers and valves are working. This procedure takes approximately one hour. There are no restrictions for this procedure. Please do NOT wear cologne, perfume, aftershave, or lotions (deodorant is allowed). Please arrive 15 minutes prior to your appointment time.  Please note: We ask at that you not bring children with you during ultrasound (echo/ vascular) testing. Due to room size and safety concerns, children are not allowed in the ultrasound rooms during exams. Our front office staff cannot provide observation of children in our lobby area while testing is being conducted. An adult accompanying a patient to their appointment will only be allowed in the ultrasound room at the discretion of the ultrasound technician under special circumstances. We apologize for any inconvenience.  You have been referred to pulmonology. They will call you to arrange your appointment.  Your physician recommends that you schedule a follow-up appointment in: 3 months.  If you have any questions or concerns before your next appointment please send [place] a message through [place] or call our office at [phone].    TO LEAVE A MESSAGE FOR THE NURSE SELECT OPTION 2, PLEASE LEAVE A MESSAGE INCLUDING: YOUR NAME DATE OF BIRTH CALL BACK NUMBER REASON FOR CALL**this is important as we prioritize the call backs  YOU WILL RECEIVE A CALL BACK THE SAME DAY AS LONG AS YOU CALL BEFORE 4:00 PM  At the [facility], you and your health needs are our priority. As part of our continuing mission to provide you with  exceptional heart care, we have created designated Provider Care Teams. These Care Teams include your primary Cardiologist (physician) and Advanced Practice Providers (APPs- Physician Assistants and Nurse Practitioners) who all work together to provide you with the care you need, when you need it.   You may see any of the following providers on your designated Care Team at your next follow up: Dr Arvilla Meres Dr Marca Ancona Dr. Dorthula Nettles Dr. Clearnce Hasten Amy Filbert Schilder, NP Robbie Lis, [place] [facility] [place], [place] Brynda Peon, NP [place] Lee, NP Clarisa Kindred, NP Karle Plumber, PharmD Enos Fling, PharmD   Please be sure to bring in all your medications bottles to every appointment.    Thank you for choosing [facility]

## 2024-01-22 NOTE — Progress Notes (Signed)
 ID:  Dylan Benton, DOB 07-16-1941, MRN [id number]   Provider location: [facility] Type of Visit: Established patient   PCP:  Corwin Levins, MD  Cardiologist:  Nanetta Batty, MD Primary HF: Dr. Shirlee Latch   History of Present Illness: Dylan Benton is a 83 y.o. male who has a history of HTN and cardiomyopathy of uncertain etiology.  He was referred by Dr. Allyson Sabal for evaluation of dyspnea and CHF.  Patient was first found to have decreased systolic function in 1/17 when echo showed EF 45-50%. At that time, he had mild exertional dyspnea. Last summer, he had gotten to the point where he had to take frequent breaks mowing the lawn.  However, since the beginning of this year, he has had worsening dyspnea.  Echo was done in 1/19, showing EF down to 35-40%.  Cardiolite did not show a definite perfusion defect. 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 Cardiac MRI in 4/19 showed EF 46%, LGE pattern that was concerning for myocarditis. PYP scan was negative.   Echo in 7/20 showed  EF 45%, RV normal.  Echo in 9/21 showed EF 45-50%, diffuse hypokinesis with normal RV.  Echo in 9/22 showed EF 50-55%.   Zio patch x 7 days in 10/21 showed no significant arrhythmia.   Echo in 4/24 showed EF 45-50%, normal RV, IVC normal.   He returns for followup of CHF.   Weight is up a few lbs but he attributes this to heavier clothes.  No dyspnea walking into office. No chest pain.  He is short of breath walking longer distances.  Now using a cane. Stiff left leg with sciatica.  Generalized fatigue.  He is using albuterol every today.   ECG (personally reviewed): NSR, low voltage P's  REDS clip 23%  Labs (12/18): LDL 47 Labs (3/19): K 4.3, creatinine 1.09 Labs (4/19): Myeloma pattern negative Labs (5/19): K 4.6, creatinine 1.06 Labs (6/19): LDL 48, HDl 42, K 4, creatinine 1.15 Labs (6/20): LDL 51, HDl 37, K 4.4, creatinine 0.86, LFTs normal, TSH normal Labs (12/20): LDL 64, HDL  43 Labs (3/21): K 4.1, creatinine 0.98 Labs (6/21): K 4.1, creatinine 1.17 Labs (9/21): K 4.2, creatinine 1.0 Labs (3/22): K 4.1, creatinine 1.1, LDL 48, B12 normal Labs (5/22): K 4.2, creatinine 1.08 Labs (10/23): K 4.6, creatinine 1.12, LDL 55 Labs (4/24): K 4.9, creatinine 1.21, TSH normal, hgb 14.7, LDL 53, TGs 68 Labs (10/24): LDL 64, K 4, creatinine 1.12  PMH: 1. Low back pain 2. Cardiomyopathy: LHC in 2006 with normal coronaries.  Echo in 1/17 with EF 45-50%.  - Echo (1/19): EF 35-40%, mild LVH - Cardiolite (2/19): EF 41%, no ischemia/infarction.  - LHC (3/19): 50% ostial LAD, 50% ramus. LVEDP 12.  - Cardiac MRI (4/19): EF 46%, normal RV size and systolic function, LGE pattern suggestive of prior myocarditis.  - PYP scan (4/19): Negative, not suggestive of TTR amyloidosis. - Myeloma panel negative - Echo (7/20): EF 45%, RV normal, IVC normal - Echo (9/21): EF 45-50%, diffuse hypokinesis, normal RV, IVC normal.  - Echo (9/22): EF 50-55%.  - Echo (4/24): EF 45-50%, normal RV, IVC normal. 3. HTN 4. Hyperlipidemia 5. CAD: LHC (3/19) with 50% ostial LAD, 50% ramus. 6. COPD: Prior smoker.  PFTs 11/19 with moderate obstruction.  7. Vasovagal syncope/presyncope.  8. PAD: peripheral arterial dopplers (3/21) showed occluded right AT artery, 30-49% left SFA.  - ABIs (11/23): Minimal PAD.  9. Zio patch (  10/21): x 7 days, no significant arrhythmias.  10. Peripheral neuropathy  Social History   Socioeconomic History   Marital status: Married    Spouse name: Not on file   Number of children: 2   Years of education: 14   Highest education level: Not on file  Occupational History   Occupation: Lobbyist (Part-time)  Tobacco Use   Smoking status: Former    Current packs/day: 0.00    Types: Cigarettes    Quit date: 12/02/1988    Years since quitting: 35.1   Smokeless tobacco: Never  Vaping Use   Vaping status: Never Used  Substance and Sexual Activity   Alcohol use: No    Drug use: No   Sexual activity: Not on file  Other Topics Concern   Not on file  Social History Narrative   Works for [employer] at [employer] in [place].   Social Drivers of Corporate investment banker Strain: Low Risk  (04/12/2023)   Overall Financial Resource Strain (CARDIA)    Difficulty of Paying Living Expenses: Not hard at all  Food Insecurity: No Food Insecurity (04/12/2023)   Hunger Vital Sign    Worried About Running Out of Food in the Last Year: Never true    Ran Out of Food in the Last Year: Never true  Transportation Needs: No Transportation Needs (04/12/2023)   PRAPARE - Administrator, Civil Service (Medical): No    Lack of Transportation (Non-Medical): No  Physical Activity: Sufficiently Active (04/12/2023)   Exercise Vital Sign    Days of Exercise per Week: 5 days    Minutes of Exercise per Session: 30 min  Stress: No Stress Concern Present (04/12/2023)   [employer] of Occupational Health - Occupational Stress Questionnaire    Feeling of Stress : Not at all  Social Connections: Socially Integrated (04/12/2023)   Social Connection and Isolation Panel [NHANES]    Frequency of Communication with Friends and Family: More than three times a week    Frequency of Social Gatherings with Friends and Family: More than three times a week    Attends Religious Services: More than 4 times per year    Active Member of [employer] or Organizations: Yes    Attends Engineer, structural: More than 4 times per year    Marital Status: Married  Catering manager Violence: Not At Risk (04/12/2023)   Humiliation, Afraid, Rape, and Kick questionnaire    Fear of Current or Ex-Partner: No    Emotionally Abused: No    Physically Abused: No    Sexually Abused: No   Family History  Problem Relation Age of Onset   Coronary artery disease Other    Hypertension Other    Stroke Other    Colon cancer Neg Hx    Esophageal cancer Neg Hx    Rectal cancer Neg Hx    Stomach  cancer Neg Hx    ROS: All systems reviewed and negative except as per HPI.  Current Outpatient Medications  Medication Sig Dispense Refill   albuterol (VENTOLIN HFA) 108 (90 Base) MCG/ACT inhaler TAKE 2 PUFFS BY MOUTH EVERY 6 HOURS AS NEEDED FOR WHEEZE OR SHORTNESS OF BREATH 8.5 each 11   aspirin 81 MG EC tablet TAKE 1 TABLET BY MOUTH EVERY DAY 90 tablet 3   carvedilol (COREG) 6.25 MG tablet TAKE 1 TABLET BY MOUTH TWICE A DAY WITH FOOD 180 tablet 3   dorzolamide-timolol (COSOPT) 22.3-6.8 MG/ML ophthalmic solution Place 1 drop into both  eyes 2 (two) times daily.     ENTRESTO 97-103 MG TAKE 1 TABLET BY MOUTH TWICE A DAY 180 tablet 3   FARXIGA 10 MG TABS tablet TAKE 1 TABLET (10 MG TOTAL) BY MOUTH DAILY BEFORE BREAKFAST 90 tablet 3   latanoprost (XALATAN) 0.005 % ophthalmic solution Place 1 drop into both eyes at bedtime.      meloxicam (MOBIC) 7.5 MG tablet TAKE 1 TABLET BY MOUTH EVERY DAY 90 tablet 3   rosuvastatin (CRESTOR) 10 MG tablet TAKE 1 TABLET BY MOUTH EVERY DAY 90 tablet 1   spironolactone (ALDACTONE) 25 MG tablet TAKE 1 TABLET BY MOUTH EVERY DAY 90 tablet 3   tiotropium (SPIRIVA HANDIHALER) 18 MCG inhalation capsule Place 1 capsule (18 mcg total) into inhaler and inhale daily. 30 capsule 12   No current facility-administered medications for this encounter.   BP 102/60   Pulse (!) 51   Wt 114.9 kg (253 lb 3.2 oz)   SpO2 97%   BMI 31.65 kg/m  General: NAD Neck: No JVD, no thyromegaly or thyroid nodule.  Lungs: Clear to auscultation bilaterally with normal respiratory effort. CV: Nondisplaced PMI.  Heart regular S1/S2, no S3/S4, no murmur.  1+ ankle edema.  No carotid bruit.  Normal pedal pulses.  Abdomen: Soft, nontender, no hepatosplenomegaly, no distention.  Skin: Intact without lesions or rashes.  Neurologic: Alert and oriented x 3.  Psych: Normal affect. Extremities: No clubbing or cyanosis.  HEENT: Normal.  Assessment/Plan: 1. Chronic systolic CHF: Echo in 1/19 with  EF 35-40%, mild LVH. LHC in 3/19 with nonobstructive CAD.  Cardiac MRI in 4/19 showed EF 46%, LGE pattern concerning for prior myocarditis. PYP scan was negative, not suggestive of TTR amyloidosis. Myeloma panel negative.  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 Echo in 7/20 showed EF remains 45%, echo in 9/21 with EF 45-50%. Echo in 9/22 showed EF 50-55%.   Echo in 4/24 showed EF 45-50%, normal RV, IVC normal.  NYHA class II, not volume overloaded by exam or REDS clip. I think that his exertional dyspnea may be due more to COPD at this point than CHF.  - Continue Coreg 6.25 mg bid.  With h/o bradycardia, will not increase dose.  - Continue Jardiance 10 mg daily.  - Continue Entresto 97/103 mg bid. - Continue spironolactone 25 mg daily, BMET/BNP today.   - I will arrange for a repeat echo.  2. HTN: BP not elevated.  3. Hyperlipidemia.  Continue Crestor.     4. COPD: Moderate by PFTs.  I think this is symptomatic, he is using albuterol daily.  - I would like him to avoid using albuterol as regularly as he uses it.  I will start him on Spiriva daily and will refer him to pulmonary.  5. PAD: Minimal disease on 11/23 ABIs.   - Continue statin, ASA.   Followup 3 months with APP.   I spent 31 minutes reviewing records, interviewing/examining patient, and managing orders.   Signed, Marca Ancona, MD  01/22/2024  [facility] [address] [facility] [place] [address] [phone] (office) [phone] (fax)

## 2024-02-21 ENCOUNTER — Other Ambulatory Visit (HOSPITAL_COMMUNITY): Payer: Self-pay | Admitting: Cardiology

## 2024-02-29 ENCOUNTER — Ambulatory Visit (HOSPITAL_COMMUNITY)
Admission: RE | Admit: 2024-02-29 | Discharge: 2024-02-29 | Disposition: A | Discharge status: Home / Self Care | Payer: Medicare Other | Source: Ambulatory Visit | Attending: Internal Medicine | Admitting: Internal Medicine

## 2024-02-29 DIAGNOSIS — I5022 Chronic systolic (congestive) heart failure: Secondary | ICD-10-CM | POA: Diagnosis not present

## 2024-02-29 DIAGNOSIS — I358 Other nonrheumatic aortic valve disorders: Secondary | ICD-10-CM | POA: Diagnosis not present

## 2024-02-29 LAB — ECHOCARDIOGRAM COMPLETE
AR max vel: 4.01 cm2
AV Area VTI: 4.88 cm2
AV Area mean vel: 3.42 cm2
AV Mean grad: 2 mmHg
AV Peak grad: 4.2 mmHg
Ao pk vel: 1.02 m/s
Area-P 1/2: 3.36 cm2
Calc EF: 46.5 %
S' Lateral: 3.8 cm
Single Plane A2C EF: 43.4 %
Single Plane A4C EF: 48.8 %

## 2024-03-02 ENCOUNTER — Telehealth (HOSPITAL_COMMUNITY): Payer: Self-pay

## 2024-03-02 NOTE — Telephone Encounter (Addendum)
 Pt aware, agreeable, and verbalized understanding   ----- Message from Marca Ancona sent at 03/01/2024  9:17 PM EDT ----- EF 45-50%, similar to prior.

## 2024-03-06 ENCOUNTER — Ambulatory Visit: Payer: Medicare Other | Admitting: Internal Medicine

## 2024-03-14 ENCOUNTER — Ambulatory Visit (INDEPENDENT_AMBULATORY_CARE_PROVIDER_SITE_OTHER): Admitting: Internal Medicine

## 2024-03-14 ENCOUNTER — Encounter: Payer: Self-pay | Admitting: Internal Medicine

## 2024-03-14 VITALS — BP 132/76 | HR 55 | Temp 98.8°F | Ht 75.0 in | Wt 251.0 lb

## 2024-03-14 DIAGNOSIS — E7800 Pure hypercholesterolemia, unspecified: Secondary | ICD-10-CM | POA: Diagnosis not present

## 2024-03-14 DIAGNOSIS — R7302 Impaired glucose tolerance (oral): Secondary | ICD-10-CM | POA: Diagnosis not present

## 2024-03-14 DIAGNOSIS — I10 Essential (primary) hypertension: Secondary | ICD-10-CM

## 2024-03-14 DIAGNOSIS — E559 Vitamin D deficiency, unspecified: Secondary | ICD-10-CM | POA: Diagnosis not present

## 2024-03-14 DIAGNOSIS — M5416 Radiculopathy, lumbar region: Secondary | ICD-10-CM

## 2024-03-14 LAB — BASIC METABOLIC PANEL WITH GFR
BUN: 11 mg/dL (ref 6–23)
CO2: 31 meq/L (ref 19–32)
Calcium: 9.3 mg/dL (ref 8.4–10.5)
Chloride: 105 meq/L (ref 96–112)
Creatinine, Ser: 1.07 mg/dL (ref 0.40–1.50)
GFR: 63.48 mL/min (ref >60.00)
Glucose, Bld: 105 mg/dL — ABNORMAL HIGH (ref 70–99)
Potassium: 4.2 meq/L (ref 3.5–5.1)
Sodium: 140 meq/L (ref 135–145)

## 2024-03-14 LAB — LIPID PANEL
Cholesterol: 118 mg/dL (ref 0–200)
HDL: 50.5 mg/dL (ref >39.00)
LDL Cholesterol: 55 mg/dL (ref 0–99)
NonHDL: 67.36
Total CHOL/HDL Ratio: 2
Triglycerides: 60 mg/dL (ref 0.0–149.0)
VLDL: 12 mg/dL (ref 0.0–40.0)

## 2024-03-14 LAB — CBC WITH DIFFERENTIAL/PLATELET
Basophils Absolute: 0 10*3/uL (ref 0.0–0.1)
Basophils Relative: 0.8 % (ref 0.0–3.0)
Eosinophils Absolute: 0 10*3/uL (ref 0.0–0.7)
Eosinophils Relative: 1.4 % (ref 0.0–5.0)
HCT: 43.2 % (ref 39.0–52.0)
Hemoglobin: 14.3 g/dL (ref 13.0–17.0)
Lymphocytes Relative: 41.1 % (ref 12.0–46.0)
Lymphs Abs: 1.4 10*3/uL (ref 0.7–4.0)
MCHC: 33 g/dL (ref 30.0–36.0)
MCV: 100.4 fl — ABNORMAL HIGH (ref 78.0–100.0)
Monocytes Absolute: 0.4 10*3/uL (ref 0.1–1.0)
Monocytes Relative: 10.7 % (ref 3.0–12.0)
Neutro Abs: 1.6 10*3/uL (ref 1.4–7.7)
Neutrophils Relative %: 46 % (ref 43.0–77.0)
Platelets: 147 10*3/uL — ABNORMAL LOW (ref 150.0–400.0)
RBC: 4.31 Mil/uL (ref 4.22–5.81)
RDW: 12.6 % (ref 11.5–15.5)
WBC: 3.5 10*3/uL — ABNORMAL LOW (ref 4.0–10.5)

## 2024-03-14 LAB — HEPATIC FUNCTION PANEL
ALT: 15 U/L (ref 0–53)
AST: 19 U/L (ref 0–37)
Albumin: 4.2 g/dL (ref 3.5–5.2)
Alkaline Phosphatase: 52 U/L (ref 39–117)
Bilirubin, Direct: 0.2 mg/dL (ref 0.0–0.3)
Total Bilirubin: 0.8 mg/dL (ref 0.2–1.2)
Total Protein: 6.8 g/dL (ref 6.0–8.3)

## 2024-03-14 LAB — HEMOGLOBIN A1C: Hgb A1c MFr Bld: 5.1 % (ref 4.6–6.5)

## 2024-03-14 NOTE — Assessment & Plan Note (Signed)
 BP Readings from Last 3 Encounters:  03/14/24 132/76  01/20/24 102/60  09/06/23 138/82   Stable, pt to continue medical treatment coreg  6.25 bid

## 2024-03-14 NOTE — Assessment & Plan Note (Signed)
 Lab Results  Component Value Date   HGBA1C 5.1 03/14/2024   Stable, pt to continue current medical treatment  - diet, wt control

## 2024-03-14 NOTE — Assessment & Plan Note (Signed)
 Chronic now much worsening pain and LLE weakness, now for LS Spine MRI, refer orthopedic

## 2024-03-14 NOTE — Patient Instructions (Signed)
 Please continue all other medications as before, and refills have been done if requested.  Please have the pharmacy call with any other refills you may need.  Please continue your efforts at being more active, low cholesterol diet, and weight control  Please keep your appointments with your specialists as you may have planned  You will be contacted regarding the referral for: MRI for the lower back, and Orthopedic  Please go to the LAB at the blood drawing area for the tests to be done  You will be contacted by phone if any changes need to be made immediately.  Otherwise, you will receive a letter about your results with an explanation, but please check with MyChart first.  Please make an Appointment to return in 6 months, or sooner if needed

## 2024-03-14 NOTE — Progress Notes (Signed)
 The test results show that your current treatment is OK, as the tests are stable.  Please continue the same plan.  There is no other need for change of treatment or further evaluation based on these results, at this time.  thanks

## 2024-03-14 NOTE — Assessment & Plan Note (Signed)
Last vitamin D Lab Results  Component Value Date   VD25OH 45.01 09/01/2022   Stable, cont oral replacement

## 2024-03-14 NOTE — Progress Notes (Signed)
 Patient ID: Dylan Benton, male   DOB: 1941/08/30, 83 y.o.   MRN: [id number]        Chief Complaint: follow up left lumbar radiculopathy, hyperglycemia, hld, htn       HPI:  Dylan Benton is a 83 y.o. male here overall ok except for Has worsening left lower back pain and persistent left leg pain and now worsening weakness and several near falls, walks with cane.  Pt denies chest pain, increased sob or doe, wheezing, orthopnea, PND, increased LE swelling, palpitations, dizziness or syncope.   Pt denies polydipsia, polyuria, or new focal neuro s/s.    Wt Readings from Last 3 Encounters:  03/14/24 251 lb (113.9 kg)  01/20/24 253 lb 3.2 oz (114.9 kg)  09/06/23 248 lb (112.5 kg)   BP Readings from Last 3 Encounters:  03/14/24 132/76  01/20/24 102/60  09/06/23 138/82         Past Medical History:  Diagnosis Date   Allergy    Anemia, unspecified    NOS ?resolved   Anxiety    Aortic root dilation (HCC)    Arthritis    ASTHMA, UNSPECIFIED, UNSPECIFIED STATUS 08/06/2009   Annotation: No exacerbations.  Qualifier: Diagnosis of  By: Twanna Galas MD, Madhav     Bladder calculus 05/05/2012   Cataract    Chest pain    ECHOCARDIOGRAM, ABNORMAL 12/26/2006    Aortic root dilation  This problem surfaced in 2006 after a cardiology referral for chest pain.  He was seen by Dr. Julane Ny in January o6 and cathed on Jan. 18.  Coronaries were nl and EF was 65%.  Echo showed mild aortic root dilation of 41mm. He was started on metoprolol (although I notice that he is no longer on this.  Annual echo to follow aortic root was advised.  Aortic root dimension was unchanged on studies in 2009 and 2010.  Will continue to follow this, perhaps not every year as it seems stable.    Erectile dysfunction 05/05/2012   GERD (gastroesophageal reflux disease) 05/05/2012   H pylori ulcer    teated H pylori   History of back surgery    Hyperlipidemia 05/08/2012   Hypertension    Impaired glucose tolerance 05/05/2012   Lumbar  disc disease 05/08/2012   Male stress incontinence 05/05/2012   Myocardial infarction [facility])    pt states, "i've been told I have had two heart attacks   Obesity    OBESITY 10/06/2006   Qualifier: Diagnosis of  By: Hiram Lukes MD, Annette Barters     Prostate cancer [facility])    Hx of    PROSTATE CANCER, HX OF 08/06/2009   Annotation: prostatectomy and radiotherapy in 1990s,  Dr. Inga Manges Qualifier: Diagnosis of  By: Twanna Galas MD, Madhav     PUD (peptic ulcer disease) 05/05/2012   Systolic CHF (HCC) 05/13/2016   Thoracic aortic aneurysm (HCC)    UNSPECIFIED OPEN-ANGLE GLAUCOMA 08/06/2009   Annotation: Managed by opthalmologist Dr. Leanor Proper Qualifier: Diagnosis of  By: Twanna Galas MD, Madhav     Past Surgical History:  Procedure Laterality Date   BACK SURGERY     ear surgury     eye surgury     LEFT HEART CATH AND CORONARY ANGIOGRAPHY N/A 02/06/2018   Procedure: LEFT HEART CATH AND CORONARY ANGIOGRAPHY;  Surgeon: Darlis Eisenmenger, MD;  Location: [facility] INVASIVE CV LAB;  Service: Cardiovascular;  Laterality: N/A;   PROSTATECTOMY      reports that he quit smoking about 35 years ago. His smoking  use included cigarettes. He has never used smokeless tobacco. He reports that he does not drink alcohol and does not use drugs. family history includes Coronary artery disease in an other family member; Hypertension in an other family member; Stroke in an other family member. Allergies  Allergen Reactions   Ciprofloxacin  Other (See Comments)    Bilateral eye swelling   Prednisone  Other (See Comments)    Effects heart rate   Sulfa Antibiotics Itching    Arms itching   Current Outpatient Medications on File Prior to Visit  Medication Sig Dispense Refill   albuterol  (VENTOLIN  HFA) 108 (90 Base) MCG/ACT inhaler TAKE 2 PUFFS BY MOUTH EVERY 6 HOURS AS NEEDED FOR WHEEZE OR SHORTNESS OF BREATH 8.5 each 11   aspirin  81 MG EC tablet TAKE 1 TABLET BY MOUTH EVERY DAY 90 tablet 3   carvedilol  (COREG ) 6.25 MG tablet TAKE 1 TABLET BY MOUTH TWICE  A DAY WITH FOOD 180 tablet 3   dorzolamide-timolol (COSOPT) 22.3-6.8 MG/ML ophthalmic solution Place 1 drop into both eyes 2 (two) times daily.     ENTRESTO  97-103 MG TAKE 1 TABLET BY MOUTH TWICE A DAY 180 tablet 3   FARXIGA  10 MG TABS tablet TAKE 1 TABLET BY MOUTH DAILY BEFORE BREAKFAST. 90 tablet 3   latanoprost (XALATAN) 0.005 % ophthalmic solution Place 1 drop into both eyes at bedtime.      meloxicam  (MOBIC ) 7.5 MG tablet TAKE 1 TABLET BY MOUTH EVERY DAY 90 tablet 3   rosuvastatin  (CRESTOR ) 10 MG tablet TAKE 1 TABLET BY MOUTH EVERY DAY 90 tablet 1   spironolactone  (ALDACTONE ) 25 MG tablet TAKE 1 TABLET BY MOUTH EVERY DAY 90 tablet 3   tiotropium (SPIRIVA  HANDIHALER) 18 MCG inhalation capsule Place 1 capsule (18 mcg total) into inhaler and inhale daily. 30 capsule 12   No current facility-administered medications on file prior to visit.        ROS:  All others reviewed and negative.  Objective        PE:  BP 132/76 (BP Location: Left Arm, Patient Position: Sitting, Cuff Size: Normal)   Pulse (!) 55   Temp 98.8 F (37.1 C) (Oral)   Ht 6\' 3"  (1.905 m)   Wt 251 lb (113.9 kg)   SpO2 99%   BMI 31.37 kg/m                 Constitutional: Pt appears in NAD               HENT: Head: NCAT.                Right Ear: External ear normal.                 Left Ear: External ear normal.                Eyes: . Pupils are equal, round, and reactive to light. Conjunctivae and EOM are normal               Nose: without d/c or deformity               Neck: Neck supple. Gross normal ROM               Cardiovascular: Normal rate and regular rhythm.                 Pulmonary/Chest: Effort normal and breath sounds without rales or wheezing.  Abd:  Soft, NT, ND, + BS, no organomegaly               Neurological: Pt is alert. At baseline orientation, motor grossly intact               Skin: Skin is warm. No rashes, no other new lesions, LE edema - none; spine mild tender in midline and  left paraspinal, motor with 3+/5 weakness                Psychiatric: Pt behavior is normal without agitation   Micro: none  Cardiac tracings I have personally interpreted today:  none  Pertinent Radiological findings (summarize): none   Lab Results  Component Value Date   WBC 3.5 (L) 03/14/2024   HGB 14.3 03/14/2024   HCT 43.2 03/14/2024   PLT 147.0 (L) 03/14/2024   GLUCOSE 105 (H) 03/14/2024   CHOL 118 03/14/2024   TRIG 60.0 03/14/2024   HDL 50.50 03/14/2024   LDLCALC 55 03/14/2024   ALT 15 03/14/2024   AST 19 03/14/2024   NA 140 03/14/2024   K 4.2 03/14/2024   CL 105 03/14/2024   CREATININE 1.07 03/14/2024   BUN 11 03/14/2024   CO2 31 03/14/2024   TSH 1.68 09/06/2023   PSA 1.65 11/09/2017   INR 1.09 01/30/2018   HGBA1C 5.1 03/14/2024   Assessment/Plan:  Dylan Benton is a 83 y.o. Black or African American [2] male with  has a past medical history of Allergy, Anemia, unspecified, Anxiety, Aortic root dilation (HCC), Arthritis, ASTHMA, UNSPECIFIED, UNSPECIFIED STATUS (08/06/2009), Bladder calculus (05/05/2012), Cataract, Chest pain, ECHOCARDIOGRAM, ABNORMAL (12/26/2006), Erectile dysfunction (05/05/2012), GERD (gastroesophageal reflux disease) (05/05/2012), H pylori ulcer, History of back surgery, Hyperlipidemia (05/08/2012), Hypertension, Impaired glucose tolerance (05/05/2012), Lumbar disc disease (05/08/2012), Male stress incontinence (05/05/2012), Myocardial infarction (HCC), Obesity, OBESITY (10/06/2006), Prostate cancer (HCC), PROSTATE CANCER, HX OF (08/06/2009), PUD (peptic ulcer disease) (05/05/2012), Systolic CHF (HCC) (05/13/2016), Thoracic aortic aneurysm (HCC), and UNSPECIFIED OPEN-ANGLE GLAUCOMA (08/06/2009).  Essential hypertension BP Readings from Last 3 Encounters:  03/14/24 132/76  01/20/24 102/60  09/06/23 138/82   Stable, pt to continue medical treatment coreg  6.25 bid   Impaired glucose tolerance Lab Results  Component Value Date   HGBA1C 5.1 03/14/2024    Stable, pt to continue current medical treatment  - diet, wt control   Hyperlipidemia Lab Results  Component Value Date   LDLCALC 55 03/14/2024   Stable, pt to continue current statin crestor  10 mg qd   Left lumbar radiculopathy Chronic now much worsening pain and LLE weakness, now for LS Spine MRI, refer orthopedic  Vitamin D  deficiency Last vitamin D  Lab Results  Component Value Date   VD25OH 45.01 09/01/2022   Stable, cont oral replacement   Followup: Return in about 6 months (around 09/13/2024).  Rosalia Colonel, MD 03/14/2024 9:05 PM [facility] Internal Medicine

## 2024-03-14 NOTE — Assessment & Plan Note (Signed)
 Lab Results  Component Value Date   LDLCALC 55 03/14/2024   Stable, pt to continue current statin crestor  10 mg qd

## 2024-04-02 DIAGNOSIS — H401133 Primary open-angle glaucoma, bilateral, severe stage: Secondary | ICD-10-CM | POA: Diagnosis not present

## 2024-04-02 NOTE — Progress Notes (Signed)
 ID:  Dylan Benton, DOB 10-Apr-1941, MRN [id number]   Provider location: [place] [facility] Type of Visit: Established patient   PCP:  Roslyn Coombe, MD  Cardiologist:  Lauro Portal, MD Primary HF: Dr. Mitzie Anda   History of Present Illness: Dylan Benton is a 83 y.o. male who has a history of HTN and cardiomyopathy of uncertain etiology.  He was referred by Dr. Katheryne Pane for evaluation of dyspnea and CHF.  Patient was first found to have decreased systolic function in 1/17 when echo showed EF 45-50%. At that time, he had mild exertional dyspnea. Last summer, he had gotten to the point where he had to take frequent breaks mowing the lawn.  However, since the beginning of this year, he has had worsening dyspnea.  Echo was done in 1/19, showing EF down to 35-40%.  Cardiolite did not show a definite perfusion defect. 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 Cardiac MRI in 4/19 showed EF 46%, LGE pattern that was concerning for myocarditis. PYP scan was negative.   Echo in 7/20 showed  EF 45%, RV normal.  Echo in 9/21 showed EF 45-50%, diffuse hypokinesis with normal RV.  Echo in 9/22 showed EF 50-55%.   Zio patch x 7 days in 10/21 showed no significant arrhythmia.   Echo in 4/24 showed EF 45-50%, normal RV, IVC normal.   He returns for followup of CHF.   Weight is up a few lbs but he attributes this to heavier clothes.  No dyspnea walking into office. No chest pain.  He is short of breath walking longer distances.  Now using a cane. Stiff left leg with sciatica.  Generalized fatigue.  He is using albuterol  every today.   ECG (personally reviewed): NSR, low voltage P's  REDS clip 23%  Labs (12/18): LDL 47 Labs (3/19): K 4.3, creatinine 1.09 Labs (4/19): Myeloma pattern negative Labs (5/19): K 4.6, creatinine 1.06 Labs (6/19): LDL 48, HDl 42, K 4, creatinine 1.15 Labs (6/20): LDL 51, HDl 37, K 4.4, creatinine 3.24, LFTs normal, TSH normal Labs (12/20): LDL 64, HDL  43 Labs (3/21): K 4.1, creatinine 0.98 Labs (6/21): K 4.1, creatinine 1.17 Labs (9/21): K 4.2, creatinine 1.0 Labs (3/22): K 4.1, creatinine 1.1, LDL 48, B12 normal Labs (5/22): K 4.2, creatinine 1.08 Labs (10/23): K 4.6, creatinine 1.12, LDL 55 Labs (4/24): K 4.9, creatinine 1.21, TSH normal, hgb 14.7, LDL 53, TGs 68 Labs (10/24): LDL 64, K 4, creatinine 1.12  PMH: 1. Low back pain 2. Cardiomyopathy: LHC in 2006 with normal coronaries.  Echo in 1/17 with EF 45-50%.  - Echo (1/19): EF 35-40%, mild LVH - Cardiolite (2/19): EF 41%, no ischemia/infarction.  - LHC (3/19): 50% ostial LAD, 50% ramus. LVEDP 12.  - Cardiac MRI (4/19): EF 46%, normal RV size and systolic function, LGE pattern suggestive of prior myocarditis.  - PYP scan (4/19): Negative, not suggestive of TTR amyloidosis. - Myeloma panel negative - Echo (7/20): EF 45%, RV normal, IVC normal - Echo (9/21): EF 45-50%, diffuse hypokinesis, normal RV, IVC normal.  - Echo (9/22): EF 50-55%.  - Echo (4/24): EF 45-50%, normal RV, IVC normal. 3. HTN 4. Hyperlipidemia 5. CAD: LHC (3/19) with 50% ostial LAD, 50% ramus. 6. COPD: Prior smoker.  PFTs 11/19 with moderate obstruction.  7. Vasovagal syncope/presyncope.  8. PAD: peripheral arterial dopplers (3/21) showed occluded right AT artery, 30-49% left SFA.  - ABIs (11/23): Minimal PAD.  9. Zio patch (  10/21): x 7 days, no significant arrhythmias.  10. Peripheral neuropathy  Social History   Socioeconomic History   Marital status: Married    Spouse name: Not on file   Number of children: 2   Years of education: 14   Highest education level: Not on file  Occupational History   Occupation: Lobbyist (Part-time)  Tobacco Use   Smoking status: Former    Current packs/day: 0.00    Types: Cigarettes    Quit date: 12/02/1988    Years since quitting: 35.3   Smokeless tobacco: Never  Vaping Use   Vaping status: Never Used  Substance and Sexual Activity   Alcohol use: No    Drug use: No   Sexual activity: Not on file  Other Topics Concern   Not on file  Social History Narrative   Works for [employer] at [employer] in [place].   Social Drivers of Corporate investment banker Strain: Low Risk  (04/12/2023)   Overall Financial Resource Strain (CARDIA)    Difficulty of Paying Living Expenses: Not hard at all  Food Insecurity: No Food Insecurity (04/12/2023)   Hunger Vital Sign    Worried About Running Out of Food in the Last Year: Never true    Ran Out of Food in the Last Year: Never true  Transportation Needs: No Transportation Needs (04/12/2023)   PRAPARE - Administrator, Civil Service (Medical): No    Lack of Transportation (Non-Medical): No  Physical Activity: Sufficiently Active (04/12/2023)   Exercise Vital Sign    Days of Exercise per Week: 5 days    Minutes of Exercise per Session: 30 min  Stress: No Stress Concern Present (04/12/2023)   [employer] of Occupational Health - Occupational Stress Questionnaire    Feeling of Stress : Not at all  Social Connections: Socially Integrated (04/12/2023)   Social Connection and Isolation Panel [NHANES]    Frequency of Communication with Friends and Family: More than three times a week    Frequency of Social Gatherings with Friends and Family: More than three times a week    Attends Religious Services: More than 4 times per year    Active Member of [employer] or Organizations: Yes    Attends Engineer, structural: More than 4 times per year    Marital Status: Married  Catering manager Violence: Not At Risk (04/12/2023)   Humiliation, Afraid, Rape, and Kick questionnaire    Fear of Current or Ex-Partner: No    Emotionally Abused: No    Physically Abused: No    Sexually Abused: No   Family History  Problem Relation Age of Onset   Coronary artery disease Other    Hypertension Other    Stroke Other    Colon cancer Neg Hx    Esophageal cancer Neg Hx    Rectal cancer Neg Hx    Stomach  cancer Neg Hx    ROS: All systems reviewed and negative except as per HPI.  Current Outpatient Medications  Medication Sig Dispense Refill   albuterol  (VENTOLIN  HFA) 108 (90 Base) MCG/ACT inhaler TAKE 2 PUFFS BY MOUTH EVERY 6 HOURS AS NEEDED FOR WHEEZE OR SHORTNESS OF BREATH 8.5 each 11   aspirin  81 MG EC tablet TAKE 1 TABLET BY MOUTH EVERY DAY 90 tablet 3   carvedilol  (COREG ) 6.25 MG tablet TAKE 1 TABLET BY MOUTH TWICE A DAY WITH FOOD 180 tablet 3   dorzolamide-timolol (COSOPT) 22.3-6.8 MG/ML ophthalmic solution Place 1 drop into both  eyes 2 (two) times daily.     ENTRESTO  97-103 MG TAKE 1 TABLET BY MOUTH TWICE A DAY 180 tablet 3   FARXIGA  10 MG TABS tablet TAKE 1 TABLET BY MOUTH DAILY BEFORE BREAKFAST. 90 tablet 3   latanoprost (XALATAN) 0.005 % ophthalmic solution Place 1 drop into both eyes at bedtime.      meloxicam  (MOBIC ) 7.5 MG tablet TAKE 1 TABLET BY MOUTH EVERY DAY 90 tablet 3   rosuvastatin  (CRESTOR ) 10 MG tablet TAKE 1 TABLET BY MOUTH EVERY DAY 90 tablet 1   spironolactone  (ALDACTONE ) 25 MG tablet TAKE 1 TABLET BY MOUTH EVERY DAY 90 tablet 3   tiotropium (SPIRIVA  HANDIHALER) 18 MCG inhalation capsule Place 1 capsule (18 mcg total) into inhaler and inhale daily. 30 capsule 12   No current facility-administered medications for this visit.   There were no vitals taken for this visit. General: NAD Neck: No JVD, no thyromegaly or thyroid  nodule.  Lungs: Clear to auscultation bilaterally with normal respiratory effort. CV: Nondisplaced PMI.  Heart regular S1/S2, no S3/S4, no murmur.  1+ ankle edema.  No carotid bruit.  Normal pedal pulses.  Abdomen: Soft, nontender, no hepatosplenomegaly, no distention.  Skin: Intact without lesions or rashes.  Neurologic: Alert and oriented x 3.  Psych: Normal affect. Extremities: No clubbing or cyanosis.  HEENT: Normal.  Assessment/Plan: 1. Chronic systolic CHF: Echo in 1/19 with EF 35-40%, mild LVH. LHC in 3/19 with nonobstructive CAD.   Cardiac MRI in 4/19 showed EF 46%, LGE pattern concerning for prior myocarditis. PYP scan was negative, not suggestive of TTR amyloidosis. Myeloma panel negative.  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 Echo in 7/20 showed EF remains 45%, echo in 9/21 with EF 45-50%. Echo in 9/22 showed EF 50-55%.   Echo in 4/24 showed EF 45-50%, normal RV, IVC normal.  NYHA class II, not volume overloaded by exam or REDS clip. I think that his exertional dyspnea may be due more to COPD at this point than CHF.  - Continue Coreg  6.25 mg bid.  With h/o bradycardia, will not increase dose.  - Continue Jardiance 10 mg daily.  - Continue Entresto  97/103 mg bid. - Continue spironolactone  25 mg daily, BMET/BNP today.   - I will arrange for a repeat echo.  2. HTN: BP not elevated.  3. Hyperlipidemia.  Continue Crestor .     4. COPD: Moderate by PFTs.  I think this is symptomatic, he is using albuterol  daily.  - I would like him to avoid using albuterol  as regularly as he uses it.  I will start him on Spiriva  daily and will refer him to pulmonary.  5. PAD: Minimal disease on 11/23 ABIs.   - Continue statin, ASA.   Followup 3 months with APP.   I spent 31 minutes reviewing records, interviewing/examining patient, and managing orders.   Signed, Elmarie Hacking, FNP  04/02/2024  [facility] [address] [facility] [place] [address] [phone] (office) [phone] (fax)

## 2024-04-04 ENCOUNTER — Encounter (HOSPITAL_COMMUNITY): Payer: Self-pay

## 2024-04-04 ENCOUNTER — Ambulatory Visit (HOSPITAL_COMMUNITY)
Admission: RE | Admit: 2024-04-04 | Discharge: 2024-04-04 | Disposition: A | Discharge status: Home / Self Care | Payer: Medicare Other | Source: Ambulatory Visit | Attending: Family Medicine | Admitting: Family Medicine

## 2024-04-04 VITALS — BP 134/68 | HR 58 | Wt 250.4 lb

## 2024-04-04 DIAGNOSIS — I110 Hypertensive heart disease with heart failure: Secondary | ICD-10-CM | POA: Diagnosis not present

## 2024-04-04 DIAGNOSIS — I5022 Chronic systolic (congestive) heart failure: Secondary | ICD-10-CM | POA: Diagnosis not present

## 2024-04-04 DIAGNOSIS — Z79899 Other long term (current) drug therapy: Secondary | ICD-10-CM | POA: Insufficient documentation

## 2024-04-04 DIAGNOSIS — I2510 Atherosclerotic heart disease of native coronary artery without angina pectoris: Secondary | ICD-10-CM | POA: Insufficient documentation

## 2024-04-04 DIAGNOSIS — J439 Emphysema, unspecified: Secondary | ICD-10-CM | POA: Insufficient documentation

## 2024-04-04 DIAGNOSIS — E785 Hyperlipidemia, unspecified: Secondary | ICD-10-CM | POA: Diagnosis not present

## 2024-04-04 DIAGNOSIS — Z87891 Personal history of nicotine dependence: Secondary | ICD-10-CM | POA: Diagnosis not present

## 2024-04-04 DIAGNOSIS — I428 Other cardiomyopathies: Secondary | ICD-10-CM | POA: Insufficient documentation

## 2024-04-04 DIAGNOSIS — Z8249 Family history of ischemic heart disease and other diseases of the circulatory system: Secondary | ICD-10-CM | POA: Insufficient documentation

## 2024-04-04 DIAGNOSIS — I739 Peripheral vascular disease, unspecified: Secondary | ICD-10-CM | POA: Insufficient documentation

## 2024-04-04 DIAGNOSIS — I10 Essential (primary) hypertension: Secondary | ICD-10-CM

## 2024-04-04 NOTE — Patient Instructions (Signed)
 Thank you for coming in today  If you had labs drawn today, any labs that are abnormal the clinic will call you No news is good news  Medications: No changes  Follow up appointments:  Your physician recommends that you schedule a follow-up appointment in:  6 months With Dr. Mitzie Anda Please call our office to schedule the follow-up appointment in August/September 2025 .    Do the following things EVERYDAY: Weigh yourself in the morning before breakfast. Write it down and keep it in a log. Take your medicines as prescribed Eat low salt foods--Limit salt (sodium) to 2000 mg per day.  Stay as active as you can everyday Limit all fluids for the day to less than 2 liters   At the [facility], you and your health needs are our priority. As part of our continuing mission to provide you with exceptional heart care, we have created designated Provider Care Teams. These Care Teams include your primary Cardiologist (physician) and Advanced Practice Providers (APPs- Physician Assistants and Nurse Practitioners) who all work together to provide you with the care you need, when you need it.   You may see any of the following providers on your designated Care Team at your next follow up: Dr Jules Oar Dr Peder Bourdon Dr. Mimi Alt, NP Ruddy Corral, [place] [facility] [place], [place] Dennise Fitz, NP Luster Salters, PharmD   Please be sure to bring in all your medications bottles to every appointment.    Thank you for choosing [facility]  If you have any questions or concerns before your next appointment please send us  a message through [place] or call our office at [phone].    TO LEAVE A MESSAGE FOR THE NURSE SELECT OPTION 2, PLEASE LEAVE A MESSAGE INCLUDING: YOUR NAME DATE OF BIRTH CALL BACK NUMBER REASON FOR CALL**this is important as we prioritize the call backs  YOU WILL RECEIVE A CALL BACK  THE SAME DAY AS LONG AS YOU CALL BEFORE 4:00 PM

## 2024-05-02 DIAGNOSIS — C61 Malignant neoplasm of prostate: Secondary | ICD-10-CM | POA: Diagnosis not present

## 2024-05-09 DIAGNOSIS — Z8546 Personal history of malignant neoplasm of prostate: Secondary | ICD-10-CM | POA: Diagnosis not present

## 2024-05-09 DIAGNOSIS — N3946 Mixed incontinence: Secondary | ICD-10-CM | POA: Diagnosis not present

## 2024-05-11 ENCOUNTER — Telehealth: Payer: Self-pay | Admitting: Internal Medicine

## 2024-05-11 NOTE — Telephone Encounter (Signed)
 Due to the nature of the test he is referring to, it would be best to have ROV to document the need and the Insurance would be better able to be approved.

## 2024-05-11 NOTE — Telephone Encounter (Signed)
 Copied from CRM [id number]. Topic: Referral - Question >> May 11, 2024 10:20 AM Earnestine Goes B wrote: Reason for CRM: pt called regarding MRI referral . Pt states his referral expired, he needs an updated referral  for a mri please call pt back [phone]  ---  Appears MRI referral from 4.23.25 is still active. Please confirm and advise pt.  TDW

## 2024-05-14 NOTE — Telephone Encounter (Signed)
 Called and left voicemail to call the office to schedule an OV.

## 2024-05-15 ENCOUNTER — Other Ambulatory Visit (HOSPITAL_COMMUNITY): Payer: Self-pay | Admitting: Cardiology

## 2024-06-07 ENCOUNTER — Encounter: Payer: Self-pay | Admitting: Internal Medicine

## 2024-06-17 ENCOUNTER — Other Ambulatory Visit (HOSPITAL_COMMUNITY): Payer: Self-pay | Admitting: Cardiology

## 2024-06-17 ENCOUNTER — Other Ambulatory Visit: Payer: Self-pay | Admitting: Internal Medicine

## 2024-06-22 ENCOUNTER — Ambulatory Visit
Admission: RE | Admit: 2024-06-22 | Discharge: 2024-06-22 | Disposition: A | Discharge status: Home / Self Care | Source: Ambulatory Visit | Attending: Internal Medicine | Admitting: Internal Medicine

## 2024-06-22 DIAGNOSIS — M5416 Radiculopathy, lumbar region: Secondary | ICD-10-CM

## 2024-06-22 DIAGNOSIS — M47816 Spondylosis without myelopathy or radiculopathy, lumbar region: Secondary | ICD-10-CM | POA: Diagnosis not present

## 2024-06-22 DIAGNOSIS — M48061 Spinal stenosis, lumbar region without neurogenic claudication: Secondary | ICD-10-CM | POA: Diagnosis not present

## 2024-06-30 ENCOUNTER — Ambulatory Visit: Payer: Self-pay | Admitting: Internal Medicine

## 2024-07-30 DIAGNOSIS — H401133 Primary open-angle glaucoma, bilateral, severe stage: Secondary | ICD-10-CM | POA: Diagnosis not present

## 2024-08-28 ENCOUNTER — Telehealth: Payer: Self-pay | Admitting: Internal Medicine

## 2024-08-28 NOTE — Telephone Encounter (Signed)
 Last OV notes & labs have been faxed today.

## 2024-08-28 NOTE — Telephone Encounter (Signed)
 Copied from CRM #[id number]. Topic: General - Other >> Aug 28, 2024 12:37 PM Aleatha C wrote: Reason for CRM: Patient needs last lab  work and last visit notes fax over to [place] Fax# [phone] in case of Dr. Sintron

## 2024-08-30 ENCOUNTER — Ambulatory Visit (INDEPENDENT_AMBULATORY_CARE_PROVIDER_SITE_OTHER): Admitting: Orthopedic Surgery

## 2024-08-30 ENCOUNTER — Other Ambulatory Visit (INDEPENDENT_AMBULATORY_CARE_PROVIDER_SITE_OTHER): Payer: Self-pay

## 2024-08-30 VITALS — BP 134/77 | HR 53 | Ht 75.0 in | Wt 246.8 lb

## 2024-08-30 DIAGNOSIS — M545 Low back pain, unspecified: Secondary | ICD-10-CM

## 2024-08-30 DIAGNOSIS — M48062 Spinal stenosis, lumbar region with neurogenic claudication: Secondary | ICD-10-CM

## 2024-08-31 NOTE — Progress Notes (Signed)
 Orthopedic Spine Surgery Office Note  Assessment: Patient is a 83 y.o. male with low back pain that radiates into the left lower extremity consistent with radiculopathy.  Also experiencing bilateral leg heaviness with walking consistent with neurogenic claudication   Plan: -Explained that initially conservative treatment is tried as a significant number of patients may experience relief with these treatment modalities. Discussed that the conservative treatments include:  -activity modification  -physical therapy  -over the counter pain medications  -medrol  dosepak  -lumbar steroid injections -Patient has tried tylenol , meloxicam   -Recommended PT.  Referral provided to him today -Patient should return to office in 8 weeks, x-rays at next visit: none   Patient expressed understanding of the plan and all questions were answered to the patient's satisfaction.   ___________________________________________________________________________   History:  Patient is a 83 y.o. male who presents today for lumbar spine.  Patient has had about 6 months of low back pain and radiating left leg pain.  He feels the pain along the anterior and lateral aspects of the thigh.  They will often radiate along the lateral aspect of the leg as well.  He also has noticed a heaviness feeling in the legs when he is walking.  He said that has been getting worse with time.  He said the meloxicam  does help with the pain but not the heaviness.  He said he has had difficulty walking longer distances because of the heaviness sensation.  There was no trauma or injury that preceded the onset of the symptoms.   Weakness: Yes, legs feel weaker when walking.  No other weakness noted Symptoms of imbalance: Denies Paresthesias and numbness: Denies Bowel or bladder incontinence: Denies Saddle anesthesia: Denies  Treatments tried: tylenol , meloxicam   Review of systems: Denies fevers and chills, night sweats, unexplained weight  loss, pain that wakes him at night.  Has a history of prostate cancer in remission  Past medical history: History of prostate cancer GERD CHF COPD HLD CAD History of MI HTN  Allergies: ciprofloxacin , prednisone , sulfa  Past surgical history:  Lumbar spine surgery Prostatectomy Eye surgery  Social history: Denies use of nicotine product (smoking, vaping, patches, smokeless) Alcohol use: Denies Denies recreational drug use   Physical Exam:  BMI of 30.9  General: no acute distress, appears stated age, ambulating with a cane Neurologic: alert, answering questions appropriately, following commands Respiratory: unlabored breathing on room air, symmetric chest rise Psychiatric: appropriate affect, normal cadence to speech   MSK (spine):  -Strength exam      Left  Right EHL    5/5  5/5 TA    5/5  5/5 GSC    5/5  5/5 Knee extension  5/5  5/5 Hip flexion   5/5  5/5  -Sensory exam    Sensation intact to light touch in L3-S1 nerve distributions of bilateral lower extremities  -Achilles DTR: 1/4 on the left, 1/4 on the right -Patellar tendon DTR: 1/4 on the left, 1/4 on the right  -Straight leg raise: Negative bilaterally -Clonus: no beats bilaterally  -Left hip exam: No pain through range of motion -Right hip exam: No pain through range of motion  Imaging: XRs of the lumbar spine from 08/30/2024 were independently reviewed and interpreted, showing disc height loss at L5/S1.  Multiple small osteophytes at the anterior aspect of the vertebral.  Syndesmophytes seen at L2/3.  No evidence of instability on flexion/tension views.  No fracture or dislocation seen.  MRI of the lumbar spine from 06/22/2024 was independently  reviewed and interpreted, showing central stenosis at L1/2.  Central and bilateral lateral recess stenosis at L3/4.  Bilateral foraminal stenosis at L4/5 and L5/S1.   Patient name: Dylan Benton Patient MRN: [id number] Date of visit: 08/30/2024

## 2024-09-13 ENCOUNTER — Ambulatory Visit: Admitting: Internal Medicine

## 2024-09-13 ENCOUNTER — Encounter: Payer: Self-pay | Admitting: Internal Medicine

## 2024-09-13 VITALS — BP 150/80 | HR 59 | Temp 97.9°F | Ht 75.0 in | Wt 248.0 lb

## 2024-09-13 DIAGNOSIS — R269 Unspecified abnormalities of gait and mobility: Secondary | ICD-10-CM

## 2024-09-13 DIAGNOSIS — E785 Hyperlipidemia, unspecified: Secondary | ICD-10-CM

## 2024-09-13 DIAGNOSIS — R7302 Impaired glucose tolerance (oral): Secondary | ICD-10-CM | POA: Diagnosis not present

## 2024-09-13 DIAGNOSIS — E559 Vitamin D deficiency, unspecified: Secondary | ICD-10-CM | POA: Diagnosis not present

## 2024-09-13 DIAGNOSIS — I10 Essential (primary) hypertension: Secondary | ICD-10-CM | POA: Diagnosis not present

## 2024-09-13 NOTE — Patient Instructions (Addendum)
 Please have the Tdap tetanus shot at the pharmacy  Please let us  know if the Rollater does not come per the [facility], and Physical Therapy not starting in the home  Please continue all other medications as before, and refills have been done if requested.  Please have the pharmacy call with any other refills you may need.  Please continue your efforts at being more active, low cholesterol diet, and weight control.  Please keep your appointments with your specialists as you may have planned  We can hold on lab testing today  Please make an Appointment to return in 6 months, or sooner if needed

## 2024-09-13 NOTE — Progress Notes (Signed)
 Patient ID: Dylan Benton, male   DOB: 03/13/1941, 83 y.o.   MRN: [id number]        Chief Complaint: follow up htn, gait disorder, chronic lbp with bilateral pinched nerves and lumbar stenosis, low vit d, hyperglycemia, htn, hld       HPI:  Dylan Benton is a 83 y.o. male here overall doing ok but has higher risk of falling with recent worsening lumbar spinal stenosis, and bilateral pinched nerves; has been to [place] already recently and rollater as well as Home PT and orthopedic referral as been ordered, just not completed yet.  No recent falls.  Pt denies chest pain, increased sob or doe, wheezing, orthopnea, PND, increased LE swelling, palpitations, dizziness or syncope.   Pt denies polydipsia, polyuria, or new focal neuro s/s.    Pt denies fever, wt loss, night sweats, loss of appetite, or other constitutional symptoms   BP has been stable at home.         Wt Readings from Last 3 Encounters:  09/13/24 248 lb (112.5 kg)  08/30/24 246 lb 12.8 oz (111.9 kg)  04/04/24 250 lb 6.4 oz (113.6 kg)   BP Readings from Last 3 Encounters:  09/13/24 (!) 150/80  08/30/24 134/77  04/04/24 134/68         Past Medical History:  Diagnosis Date   Allergy    Anemia, unspecified    NOS ?resolved   Anxiety    Aortic root dilation    Arthritis    ASTHMA, UNSPECIFIED, UNSPECIFIED STATUS 08/06/2009   Annotation: No exacerbations.  Qualifier: Diagnosis of  By: Yancy MD, Madhav     Bladder calculus 05/05/2012   Cataract    Chest pain    ECHOCARDIOGRAM, ABNORMAL 12/26/2006    Aortic root dilation  This problem surfaced in 2006 after a cardiology referral for chest pain.  He was seen by Dr. Cherrie in January o6 and cathed on Jan. 18.  Coronaries were nl and EF was 65%.  Echo showed mild aortic root dilation of 41mm. He was started on metoprolol (although I notice that he is no longer on this.  Annual echo to follow aortic root was advised.  Aortic root dimension was unchanged on studies in 2009 and 2010.  Will  continue to follow this, perhaps not every year as it seems stable.    Erectile dysfunction 05/05/2012   GERD (gastroesophageal reflux disease) 05/05/2012   H pylori ulcer    teated H pylori   History of back surgery    Hyperlipidemia 05/08/2012   Hypertension    Impaired glucose tolerance 05/05/2012   Lumbar disc disease 05/08/2012   Male stress incontinence 05/05/2012   Myocardial infarction [facility])    pt states, i've been told I have had two heart attacks   Obesity    OBESITY 10/06/2006   Qualifier: Diagnosis of  By: Sharl MD, Jackquline     Prostate cancer [facility])    Hx of    PROSTATE CANCER, HX OF 08/06/2009   Annotation: prostatectomy and radiotherapy in 1990s,  Dr. Watt Qualifier: Diagnosis of  By: Yancy MD, Madhav     PUD (peptic ulcer disease) 05/05/2012   Systolic CHF (HCC) 05/13/2016   Thoracic aortic aneurysm    UNSPECIFIED OPEN-ANGLE GLAUCOMA 08/06/2009   Annotation: Managed by opthalmologist Dr. Geneva Qualifier: Diagnosis of  By: Yancy MD, Madhav     Past Surgical History:  Procedure Laterality Date   BACK SURGERY     ear surgury  eye surgury     LEFT HEART CATH AND CORONARY ANGIOGRAPHY N/A 02/06/2018   Procedure: LEFT HEART CATH AND CORONARY ANGIOGRAPHY;  Surgeon: Rolan Ezra RAMAN, MD;  Location: [facility] INVASIVE CV LAB;  Service: Cardiovascular;  Laterality: N/A;   PROSTATECTOMY      reports that he quit smoking about 35 years ago. His smoking use included cigarettes. He has never used smokeless tobacco. He reports that he does not drink alcohol and does not use drugs. family history includes Coronary artery disease in an other family member; Hypertension in an other family member; Stroke in an other family member. Allergies  Allergen Reactions   Ciprofloxacin  Other (See Comments)    Bilateral eye swelling   Prednisone  Other (See Comments)    Effects heart rate   Sulfa Antibiotics Itching    Arms itching   Current Outpatient Medications on File Prior to Visit   Medication Sig Dispense Refill   albuterol  (VENTOLIN  HFA) 108 (90 Base) MCG/ACT inhaler TAKE 2 PUFFS BY MOUTH EVERY 6 HOURS AS NEEDED FOR WHEEZE OR SHORTNESS OF BREATH 8.5 each 11   aspirin  81 MG EC tablet TAKE 1 TABLET BY MOUTH EVERY DAY 90 tablet 3   carvedilol  (COREG ) 6.25 MG tablet TAKE 1 TABLET BY MOUTH TWICE A DAY WITH FOOD 180 tablet 3   dorzolamide-timolol (COSOPT) 22.3-6.8 MG/ML ophthalmic solution Place 1 drop into both eyes 2 (two) times daily.     ENTRESTO  97-103 MG TAKE 1 TABLET BY MOUTH TWICE A DAY 180 tablet 3   FARXIGA  10 MG TABS tablet TAKE 1 TABLET BY MOUTH DAILY BEFORE BREAKFAST. 90 tablet 3   latanoprost (XALATAN) 0.005 % ophthalmic solution Place 1 drop into both eyes at bedtime.      meloxicam  (MOBIC ) 7.5 MG tablet TAKE 1 TABLET BY MOUTH EVERY DAY 90 tablet 3   rosuvastatin  (CRESTOR ) 10 MG tablet TAKE 1 TABLET BY MOUTH EVERY DAY 90 tablet 1   spironolactone  (ALDACTONE ) 25 MG tablet TAKE 1 TABLET BY MOUTH EVERY DAY 90 tablet 3   tiotropium (SPIRIVA  HANDIHALER) 18 MCG inhalation capsule Place 1 capsule (18 mcg total) into inhaler and inhale daily. 30 capsule 12   No current facility-administered medications on file prior to visit.        ROS:  All others reviewed and negative.  Objective        PE:  BP (!) 150/80 (BP Location: Left Arm, Patient Position: Sitting, Cuff Size: Normal)   Pulse (!) 59   Temp 97.9 F (36.6 C) (Oral)   Ht 6' 3 (1.905 m)   Wt 248 lb (112.5 kg)   SpO2 98%   BMI 31.00 kg/m                 Constitutional: Pt appears in NAD               HENT: Head: NCAT.                Right Ear: External ear normal.                 Left Ear: External ear normal.                Eyes: . Pupils are equal, round, and reactive to light. Conjunctivae and EOM are normal               Nose: without d/c or deformity               Neck:  Neck supple. Gross normal ROM               Cardiovascular: Normal rate and regular rhythm.                 Pulmonary/Chest:  Effort normal and breath sounds without rales or wheezing.                Abd:  Soft, NT, ND, + BS, no organomegaly               Neurological: Pt is alert. At baseline orientation, motor grossly intact               Skin: Skin is warm. No rashes, no other new lesions, LE edema - none               Psychiatric: Pt behavior is normal without agitation   Micro: none  Cardiac tracings I have personally interpreted today:  none  Pertinent Radiological findings (summarize): none   Lab Results  Component Value Date   WBC 3.5 (L) 03/14/2024   HGB 14.3 03/14/2024   HCT 43.2 03/14/2024   PLT 147.0 (L) 03/14/2024   GLUCOSE 105 (H) 03/14/2024   CHOL 118 03/14/2024   TRIG 60.0 03/14/2024   HDL 50.50 03/14/2024   LDLCALC 55 03/14/2024   ALT 15 03/14/2024   AST 19 03/14/2024   NA 140 03/14/2024   K 4.2 03/14/2024   CL 105 03/14/2024   CREATININE 1.07 03/14/2024   BUN 11 03/14/2024   CO2 31 03/14/2024   TSH 1.68 09/06/2023   PSA 1.65 11/09/2017   INR 1.09 01/30/2018   HGBA1C 5.1 03/14/2024   Assessment/Plan:  JACARI IANNELLO is a 83 y.o. Black or African American [2] male with  has a past medical history of Allergy, Anemia, unspecified, Anxiety, Aortic root dilation, Arthritis, ASTHMA, UNSPECIFIED, UNSPECIFIED STATUS (08/06/2009), Bladder calculus (05/05/2012), Cataract, Chest pain, ECHOCARDIOGRAM, ABNORMAL (12/26/2006), Erectile dysfunction (05/05/2012), GERD (gastroesophageal reflux disease) (05/05/2012), H pylori ulcer, History of back surgery, Hyperlipidemia (05/08/2012), Hypertension, Impaired glucose tolerance (05/05/2012), Lumbar disc disease (05/08/2012), Male stress incontinence (05/05/2012), Myocardial infarction (HCC), Obesity, OBESITY (10/06/2006), Prostate cancer (HCC), PROSTATE CANCER, HX OF (08/06/2009), PUD (peptic ulcer disease) (05/05/2012), Systolic CHF (HCC) (05/13/2016), Thoracic aortic aneurysm, and UNSPECIFIED OPEN-ANGLE GLAUCOMA (08/06/2009).  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 recently, appears more unstable with his cane use, has rollater, home PT and orthopedic being arranged per the [place], and pt to let us  know if these efforts are not completed  Essential hypertension BP Readings from Last 3 Encounters:  09/13/24 (!) 150/80  08/30/24 134/77  04/04/24 134/68   uncontrolled, liekly reactive with pain, pt to continue medical treatmentcoreg 6.25 bid, entresto  97-103 , declines other change   Impaired glucose tolerance Lab Results  Component Value Date   HGBA1C 5.1 03/14/2024   Stable, pt to continue current medical treatment farxiga  10 mg qd   Hyperlipidemia allergic rhinitis Lab Results  Component Value Date   LDLCALC 55 03/14/2024   Stable, pt to continue current statin crestor  10 mg qd  Followup: Return in about 6 months (around 03/14/2025).  Dylan Rush, MD 09/16/2024 3:48 PM [facility] Internal Medicine

## 2024-09-16 ENCOUNTER — Encounter: Payer: Self-pay | Admitting: Internal Medicine

## 2024-09-16 DIAGNOSIS — R269 Unspecified abnormalities of gait and mobility: Secondary | ICD-10-CM | POA: Insufficient documentation

## 2024-09-16 NOTE — Assessment & Plan Note (Signed)
 allergic rhinitis Lab Results  Component Value Date   LDLCALC 55 03/14/2024   Stable, pt to continue current statin crestor  10 mg qd

## 2024-09-16 NOTE — Assessment & Plan Note (Signed)
 Lab Results  Component Value Date   HGBA1C 5.1 03/14/2024   Stable, pt to continue current medical treatment farxiga  10 mg qd

## 2024-09-16 NOTE — Assessment & Plan Note (Signed)
 BP Readings from Last 3 Encounters:  09/13/24 (!) 150/80  08/30/24 134/77  04/04/24 134/68   uncontrolled, liekly reactive with pain, pt to continue medical treatmentcoreg 6.25 bid, entresto  97-103 , declines other change

## 2024-09-16 NOTE — Assessment & Plan Note (Signed)
 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

## 2024-09-16 NOTE — Assessment & Plan Note (Signed)
Last vitamin D Lab Results  Component Value Date   VD25OH 45.01 09/01/2022   Stable, cont oral replacement

## 2024-09-24 ENCOUNTER — Encounter: Payer: Self-pay | Admitting: Radiology

## 2024-09-27 ENCOUNTER — Telehealth (HOSPITAL_COMMUNITY): Payer: Self-pay

## 2024-09-27 ENCOUNTER — Other Ambulatory Visit (HOSPITAL_COMMUNITY): Payer: Self-pay | Admitting: Cardiology

## 2024-09-27 ENCOUNTER — Other Ambulatory Visit (HOSPITAL_COMMUNITY): Payer: Self-pay

## 2024-09-27 MED ORDER — ENTRESTO 97-103 MG PO TABS: 1.0000 | ORAL_TABLET | Freq: Two times a day (BID) | ORAL | 3 refills | Status: AC

## 2024-09-27 NOTE — Telephone Encounter (Signed)
 Advanced Heart Failure Patient Advocate Encounter  Test billing for this patient's current coverage ([employer]) returns a $0 copay for 90 day supply of Entresto  or Sacubitril -Valsartan .  This test claim was processed through [facility]- copay amounts may vary at other pharmacies due to pharmacy/plan contracts, or as the patient moves through the different stages of their insurance plan.  Rachel DEL, CPhT Rx Patient Advocate Phone: [phone]

## 2024-10-24 ENCOUNTER — Other Ambulatory Visit: Payer: Self-pay | Admitting: Radiology

## 2024-10-24 ENCOUNTER — Telehealth: Payer: Self-pay | Admitting: Orthopedic Surgery

## 2024-10-24 DIAGNOSIS — M545 Low back pain, unspecified: Secondary | ICD-10-CM

## 2024-10-24 DIAGNOSIS — M48062 Spinal stenosis, lumbar region with neurogenic claudication: Secondary | ICD-10-CM

## 2024-10-24 NOTE — Telephone Encounter (Signed)
 Pt called asking me to cancel his appt for tomorrow because he hasn't had the therapy that Dr. Georgina suggested. He said that he hasn't heard form anyone in this office to schedule the therapy as of yet. Call back number is [phone]

## 2024-10-24 NOTE — Telephone Encounter (Signed)
 Ordered placed for PT, they will call him soon to schedule

## 2024-10-25 ENCOUNTER — Ambulatory Visit: Admitting: Orthopedic Surgery

## 2024-11-12 ENCOUNTER — Other Ambulatory Visit: Payer: Self-pay | Admitting: Internal Medicine

## 2024-11-12 ENCOUNTER — Other Ambulatory Visit: Payer: Self-pay

## 2024-12-04 ENCOUNTER — Ambulatory Visit (HOSPITAL_COMMUNITY)
Admission: RE | Admit: 2024-12-04 | Discharge: 2024-12-04 | Disposition: A | Source: Ambulatory Visit | Attending: Physician Assistant

## 2024-12-04 ENCOUNTER — Encounter (HOSPITAL_COMMUNITY): Payer: Self-pay

## 2024-12-04 VITALS — BP 110/54 | HR 56 | Wt 247.2 lb

## 2024-12-04 DIAGNOSIS — R0609 Other forms of dyspnea: Secondary | ICD-10-CM | POA: Diagnosis not present

## 2024-12-04 DIAGNOSIS — I428 Other cardiomyopathies: Secondary | ICD-10-CM | POA: Insufficient documentation

## 2024-12-04 DIAGNOSIS — I110 Hypertensive heart disease with heart failure: Secondary | ICD-10-CM | POA: Insufficient documentation

## 2024-12-04 DIAGNOSIS — J439 Emphysema, unspecified: Secondary | ICD-10-CM | POA: Diagnosis not present

## 2024-12-04 DIAGNOSIS — I2510 Atherosclerotic heart disease of native coronary artery without angina pectoris: Secondary | ICD-10-CM | POA: Insufficient documentation

## 2024-12-04 DIAGNOSIS — I10 Essential (primary) hypertension: Secondary | ICD-10-CM

## 2024-12-04 DIAGNOSIS — E785 Hyperlipidemia, unspecified: Secondary | ICD-10-CM | POA: Insufficient documentation

## 2024-12-04 DIAGNOSIS — Z7982 Long term (current) use of aspirin: Secondary | ICD-10-CM | POA: Diagnosis not present

## 2024-12-04 DIAGNOSIS — Z7984 Long term (current) use of oral hypoglycemic drugs: Secondary | ICD-10-CM | POA: Diagnosis not present

## 2024-12-04 DIAGNOSIS — R5383 Other fatigue: Secondary | ICD-10-CM | POA: Diagnosis not present

## 2024-12-04 DIAGNOSIS — Z79899 Other long term (current) drug therapy: Secondary | ICD-10-CM | POA: Insufficient documentation

## 2024-12-04 DIAGNOSIS — I5022 Chronic systolic (congestive) heart failure: Secondary | ICD-10-CM | POA: Diagnosis not present

## 2024-12-04 DIAGNOSIS — R42 Dizziness and giddiness: Secondary | ICD-10-CM | POA: Diagnosis not present

## 2024-12-04 LAB — CBC
HCT: 44.4 % (ref 39.0–52.0)
Hemoglobin: 14.3 g/dL (ref 13.0–17.0)
MCH: 32.5 pg (ref 26.0–34.0)
MCHC: 32.2 g/dL (ref 30.0–36.0)
MCV: 100.9 fL — ABNORMAL HIGH (ref 80.0–100.0)
Platelets: 164 K/uL (ref 150–400)
RBC: 4.4 MIL/uL (ref 4.22–5.81)
RDW: 11.6 % (ref 11.5–15.5)
WBC: 4 K/uL (ref 4.0–10.5)
nRBC: 0 % (ref 0.0–0.2)

## 2024-12-04 LAB — COMPREHENSIVE METABOLIC PANEL WITH GFR
ALT: 15 U/L (ref 0–44)
AST: 24 U/L (ref 15–41)
Albumin: 4.3 g/dL (ref 3.5–5.0)
Alkaline Phosphatase: 75 U/L (ref 38–126)
Anion gap: 9 (ref 5–15)
BUN: 14 mg/dL (ref 8–23)
CO2: 27 mmol/L (ref 22–32)
Calcium: 9.9 mg/dL (ref 8.9–10.3)
Chloride: 104 mmol/L (ref 98–111)
Creatinine, Ser: 1.14 mg/dL (ref 0.61–1.24)
GFR, Estimated: >60 mL/min
Glucose, Bld: 103 mg/dL — ABNORMAL HIGH (ref 70–99)
Potassium: 4.9 mmol/L (ref 3.5–5.1)
Sodium: 141 mmol/L (ref 135–145)
Total Bilirubin: 0.7 mg/dL (ref 0.0–1.2)
Total Protein: 7.1 g/dL (ref 6.5–8.1)

## 2024-12-04 LAB — PRO BRAIN NATRIURETIC PEPTIDE: Pro Brain Natriuretic Peptide: <50 pg/mL

## 2024-12-04 NOTE — Progress Notes (Signed)
 "      PCP:  Norleen Lynwood ORN, MD  Cardiologist:  Dorn Lesches, MD Primary HF Cardiologist: Dr. Rolan   History of Present Illness: Dylan Benton is a 84 y.o. male who has a history of HTN and cardiomyopathy of uncertain etiology.  He was referred by Dr. Lesches for evaluation of dyspnea and CHF.  Patient was first found to have decreased systolic function in 1/17 when echo showed EF 45-50%. At that time, he had mild exertional dyspnea. He then developed worsening exertional fatigue and dyspnea. Echo was done in 1/19, showing EF down to 35-40%.  Cardiolite did not show a definite perfusion defect. 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 Cardiac MRI in 4/19 showed EF 46%, LGE pattern that was concerning for myocarditis. PYP scan was negative.   Echo in 7/20 showed  EF 45%, RV normal.  Echo in 9/21 showed EF 45-50%, diffuse hypokinesis with normal RV.  Echo in 9/22 showed EF 50-55%.   Zio patch x 7 days in 10/21 showed no significant arrhythmia.   Echo in 4/24 showed EF 45-50%, normal RV, IVC normal.   Echo 4/25: EF 45-50%, normal RV.  Here today for an acute visit d/t worsening fatigue and dyspnea with exertion X 1.5 months. Gets winded walking across the house. Occasionally has some relief with his inhaler but not always. He states the symptoms began a few weeks after his brand name entresto  was switched to generic. His home weight has been stable between 240-242 lb. No orthopnea, PND or lower extremity edema. BP tends to average 110-120/60s. Reports he's had intermittent lightheadedness and episodes of near syncope with micturition for several years. He now uses a urinal. Occasional dizziness if he stands too quickly. No falls. Walks with a cane. Notes some balance issues.  ECG (personally reviewed): sinus brady 54  Labs (4/19): Myeloma pattern negative Labs (4/24): K 4.9, creatinine 1.21, TSH normal, hgb 14.7, LDL 53, TGs 68 Labs (10/24): LDL 64, K 4, creatinine 1.12 Labs (4/25): K 4.2,  creatinine 1.07, normal LFTs, LDL 55  PMH: 1. Low back pain 2. Cardiomyopathy: LHC in 2006 with normal coronaries.  Echo in 1/17 with EF 45-50%.  - Echo (1/19): EF 35-40%, mild LVH - Cardiolite (2/19): EF 41%, no ischemia/infarction.  - LHC (3/19): 50% ostial LAD, 50% ramus. LVEDP 12.  - Cardiac MRI (4/19): EF 46%, normal RV size and systolic function, LGE pattern suggestive of prior myocarditis.  - PYP scan (4/19): Negative, not suggestive of TTR amyloidosis. - Myeloma panel negative - Echo (7/20): EF 45%, RV normal, IVC normal - Echo (9/21): EF 45-50%, diffuse hypokinesis, normal RV, IVC normal.  - Echo (9/22): EF 50-55%.  - Echo (4/24): EF 45-50%, normal RV, IVC normal. - Echo (4/25): EF 45-50%, normal RV 3. HTN 4. Hyperlipidemia 5. CAD: LHC (3/19) with 50% ostial LAD, 50% ramus. 6. COPD: Prior smoker.  PFTs 11/19 with moderate obstruction.  7. Vasovagal syncope/presyncope.  8. PAD: peripheral arterial dopplers (3/21) showed occluded right AT artery, 30-49% left SFA.  - ABIs (11/23): Minimal PAD.  9. Zio patch (10/21): x 7 days, no significant arrhythmias.  10. Peripheral neuropathy 11. Prostate cancer s/p resection, radiation therapy and immunotherapy  Social History   Socioeconomic History   Marital status: Married    Spouse name: Not on file   Number of children: 2   Years of education: 14   Highest education level: Not on file  Occupational History   Occupation: Lobbyist (Part-time)  Tobacco Use   Smoking status: Former    Current packs/day: 0.00    Types: Cigarettes    Quit date: 12/02/1988    Years since quitting: 36.0   Smokeless tobacco: Never  Vaping Use   Vaping status: Never Used  Substance and Sexual Activity   Alcohol use: No   Drug use: No   Sexual activity: Not on file  Other Topics Concern   Not on file  Social History Narrative   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   Social Drivers of Health   Tobacco Use: Medium Risk  (09/16/2024)   Patient History    Smoking Tobacco Use: Former    Smokeless Tobacco Use: Never    Passive Exposure: Not on file  Financial Resource Strain: Low Risk (04/12/2023)   Overall Financial Resource Strain (CARDIA)    Difficulty of Paying Living Expenses: Not hard at all  Food Insecurity: No Food Insecurity (04/12/2023)   Hunger Vital Sign    Worried About Running Out of Food in the Last Year: Never true    Ran Out of Food in the Last Year: Never true  Transportation Needs: No Transportation Needs (04/12/2023)   PRAPARE - Administrator, Civil Service (Medical): No    Lack of Transportation (Non-Medical): No  Physical Activity: Sufficiently Active (04/12/2023)   Exercise Vital Sign    Days of Exercise per Week: 5 days    Minutes of Exercise per Session: 30 min  Stress: No Stress Concern Present (04/12/2023)   [employer] of Occupational Health - Occupational Stress Questionnaire    Feeling of Stress : Not at all  Social Connections: Socially Integrated (04/12/2023)   Social Connection and Isolation Panel    Frequency of Communication with Friends and Family: More than three times a week    Frequency of Social Gatherings with Friends and Family: More than three times a week    Attends Religious Services: More than 4 times per year    Active Member of [employer] or Organizations: Yes    Attends Banker Meetings: More than 4 times per year    Marital Status: Married  Catering Manager Violence: Not At Risk (04/12/2023)   Humiliation, Afraid, Rape, and Kick questionnaire    Fear of Current or Ex-Partner: No    Emotionally Abused: No    Physically Abused: No    Sexually Abused: No  Depression (PHQ2-9): Low Risk (09/13/2024)   Depression (PHQ2-9)    PHQ-2 Score: 0  Alcohol Screen: Low Risk (04/12/2023)   Alcohol Screen    Last Alcohol Screening Score (AUDIT): 0  Housing: Low Risk (04/12/2023)   Housing    Last Housing Risk Score: 0  Utilities: Not At Risk  (04/12/2023)   AHC Utilities    Threatened with loss of utilities: No  Health Literacy: Not on file   Family History  Problem Relation Age of Onset   Coronary artery disease Other    Hypertension Other    Stroke Other    Colon cancer Neg Hx    Esophageal cancer Neg Hx    Rectal cancer Neg Hx    Stomach cancer Neg Hx    ROS: All systems reviewed and negative except as per HPI.  Current Outpatient Medications  Medication Sig Dispense Refill   albuterol  (VENTOLIN  HFA) 108 (90 Base) MCG/ACT inhaler TAKE 2 PUFFS BY MOUTH EVERY 6 HOURS AS NEEDED FOR WHEEZE OR SHORTNESS OF BREATH 8.5 each 11   aspirin  81 MG  EC tablet TAKE 1 TABLET BY MOUTH EVERY DAY 90 tablet 3   carvedilol  (COREG ) 6.25 MG tablet TAKE 1 TABLET BY MOUTH TWICE A DAY WITH FOOD 180 tablet 3   dorzolamide-timolol (COSOPT) 22.3-6.8 MG/ML ophthalmic solution Place 1 drop into both eyes 2 (two) times daily.     ENTRESTO  97-103 MG Take 1 tablet by mouth 2 (two) times daily. 180 tablet 3   FARXIGA  10 MG TABS tablet TAKE 1 TABLET BY MOUTH DAILY BEFORE BREAKFAST. 90 tablet 3   latanoprost (XALATAN) 0.005 % ophthalmic solution Place 1 drop into both eyes at bedtime.      meloxicam  (MOBIC ) 7.5 MG tablet TAKE 1 TABLET BY MOUTH EVERY DAY 90 tablet 3   rosuvastatin  (CRESTOR ) 10 MG tablet TAKE 1 TABLET BY MOUTH EVERY DAY 90 tablet 1   spironolactone  (ALDACTONE ) 25 MG tablet TAKE 1 TABLET BY MOUTH EVERY DAY 90 tablet 3   tiotropium (SPIRIVA  HANDIHALER) 18 MCG inhalation capsule Place 1 capsule (18 mcg total) into inhaler and inhale daily. 30 capsule 12   No current facility-administered medications for this visit.   There were no vitals taken for this visit. Physical Exam General:  Sitting up in bed. No resp difficulty HEENT: normal Neck: supple. no JVD.  Cor: Regular rate & rhythm. No rubs, gallops or murmurs. Lungs: clear Abdomen: soft, nontender, nondistended.Good bowel sounds. Extremities: no cyanosis, clubbing, rash, edema Neuro:  alert & orientedx3, cranial nerves grossly intact. moves all 4 extremities w/o difficulty. Affect pleasant  Assessment/Plan: 1. Chronic systolic CHF: Echo in 1/19 with EF 35-40%, mild LVH. LHC in 3/19 with nonobstructive CAD.  Cardiac MRI in 4/19 showed EF 46%, LGE pattern concerning for prior myocarditis. PYP scan was negative, not suggestive of TTR amyloidosis. Myeloma panel negative.  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 Echo in 7/20 showed EF remains 45%, echo in 9/21 with EF 45-50%. Echo in 9/22 showed EF 50-55%.   Echo in 4/24 showed EF 45-50%, normal RV, IVC normal.  Echo 4/25 showed EF 45-50%, normal RV. NYHA class III, worse with more shortness of breath and fatigue. Not volume overloaded on exam. Do not think his symptoms are explained by switching entresto  from brand name to generic. Suspect likely multifactorial. Will check CMET, ProBNP, CBC - Continue Coreg  6.25 mg bid.  With h/o bradycardia, will not increase dose.  - Continue Farxiga  10 mg daily.  - Continue Entresto  97/103 mg bid. - Continue spironolactone  25 mg daily - Check echo 2. HTN: BP stable. - Continue meds as above. 3. Hyperlipidemia.  Continue Crestor .     4. COPD: Moderate by PFTs. We referred him to Pulmonary but he declines. - He is on Spiriva  daily, he would like his PCP to manage his emphysema.  5. PAD: Minimal disease on 11/23 ABIs.   - Continue statin, ASA.  6. Orthostatic dizziness: No significant change in BP with position changes in clinic today.  - Labs as above - He will monitor blood pressure at home, gave parameters for when to call with low readings - continue using cane for balance  Follow up with Dr. Rolan 2/26 as previously scheduled  [facility], Claris Pech N, PA-C  12/04/2024  [facility] [place]  "

## 2024-12-04 NOTE — Patient Instructions (Signed)
 Medication Changes:  No Changes In Medications at this time.   Lab Work:  Labs done today, your results will be available in MyChart, we will contact you for abnormal readings.  Testing/Procedures:  ECHOCARDIOGRAM AS SCHEDULED   Follow-Up in: 6 WEEKS AS SCHEDULED WITH APP CLINIC   At the [facility], you and your health needs are our priority. We have a designated team specialized in the treatment of Heart Failure. This Care Team includes your primary Heart Failure Specialized Cardiologist (physician), Advanced Practice Providers (APPs- Physician Assistants and Nurse Practitioners), and Pharmacist who all work together to provide you with the care you need, when you need it.   You may see any of the following providers on your designated Care Team at your next follow up:  Dr. Toribio Fuel Dr. Ezra Shuck Dr. Odis Brownie Greig Mosses, NP Caffie Shed, [place] [facility] [place], [place] Beckey Coe, NP Jordan Lee, NP Tinnie Redman, PharmD   Please be sure to bring in all your medications bottles to every appointment.   Need to Contact Us :  If you have any questions or concerns before your next appointment please send us  a message through [place] or call our office at [phone].    TO LEAVE A MESSAGE FOR THE NURSE SELECT OPTION 2, PLEASE LEAVE A MESSAGE INCLUDING: YOUR NAME DATE OF BIRTH CALL BACK NUMBER REASON FOR CALL**this is important as we prioritize the call backs  YOU WILL RECEIVE A CALL BACK THE SAME DAY AS LONG AS YOU CALL BEFORE 4:00 PM

## 2024-12-06 ENCOUNTER — Ambulatory Visit (HOSPITAL_COMMUNITY): Payer: Self-pay | Admitting: Physician Assistant

## 2024-12-20 ENCOUNTER — Ambulatory Visit (HOSPITAL_COMMUNITY)

## 2024-12-21 ENCOUNTER — Other Ambulatory Visit: Payer: Self-pay | Admitting: Internal Medicine

## 2024-12-31 ENCOUNTER — Ambulatory Visit (HOSPITAL_COMMUNITY): Admitting: Cardiology

## 2025-01-15 ENCOUNTER — Ambulatory Visit (HOSPITAL_COMMUNITY)

## 2025-02-13 ENCOUNTER — Ambulatory Visit (HOSPITAL_COMMUNITY): Admitting: Cardiology

## 2025-02-13 ENCOUNTER — Other Ambulatory Visit (HOSPITAL_COMMUNITY)

## 2025-03-14 ENCOUNTER — Ambulatory Visit: Admitting: Internal Medicine
